# Patient Record
Sex: Female | Born: 1940 | Race: White | Hispanic: No | Smoking: Former smoker
Health system: Southern US, Community
[De-identification: ages and names within clinical notes are randomized; demographics above are authoritative.]

## PROBLEM LIST (undated history)

## (undated) DIAGNOSIS — J449 Chronic obstructive pulmonary disease, unspecified: Secondary | ICD-10-CM

## (undated) DIAGNOSIS — C679 Malignant neoplasm of bladder, unspecified: Secondary | ICD-10-CM

## (undated) DIAGNOSIS — Z9981 Dependence on supplemental oxygen: Secondary | ICD-10-CM

## (undated) DIAGNOSIS — Z972 Presence of dental prosthetic device (complete) (partial): Secondary | ICD-10-CM

## (undated) DIAGNOSIS — J189 Pneumonia, unspecified organism: Secondary | ICD-10-CM

## (undated) DIAGNOSIS — Z5189 Encounter for other specified aftercare: Secondary | ICD-10-CM

## (undated) DIAGNOSIS — Z87891 Personal history of nicotine dependence: Secondary | ICD-10-CM

## (undated) DIAGNOSIS — J9383 Other pneumothorax: Secondary | ICD-10-CM

## (undated) DIAGNOSIS — N289 Disorder of kidney and ureter, unspecified: Secondary | ICD-10-CM

## (undated) DIAGNOSIS — R569 Unspecified convulsions: Secondary | ICD-10-CM

## (undated) DIAGNOSIS — M1990 Unspecified osteoarthritis, unspecified site: Secondary | ICD-10-CM

## (undated) DIAGNOSIS — C801 Malignant (primary) neoplasm, unspecified: Secondary | ICD-10-CM

## (undated) DIAGNOSIS — Z87442 Personal history of urinary calculi: Secondary | ICD-10-CM

## (undated) DIAGNOSIS — F419 Anxiety disorder, unspecified: Secondary | ICD-10-CM

## (undated) DIAGNOSIS — D649 Anemia, unspecified: Secondary | ICD-10-CM

## (undated) DIAGNOSIS — R0600 Dyspnea, unspecified: Secondary | ICD-10-CM

## (undated) DIAGNOSIS — Z973 Presence of spectacles and contact lenses: Secondary | ICD-10-CM

## (undated) HISTORY — PX: DILATION AND CURETTAGE OF UTERUS: SHX78

## (undated) HISTORY — PX: ABDOMINAL HYSTERECTOMY: SHX81

## (undated) HISTORY — PX: REVISION UROSTOMY CUTANEOUS: SUR1282

---

## 2007-04-30 ENCOUNTER — Emergency Department (HOSPITAL_COMMUNITY): Admission: EM | Admit: 2007-04-30 | Discharge: 2007-04-30 | Payer: Self-pay | Admitting: Emergency Medicine

## 2007-05-07 ENCOUNTER — Ambulatory Visit (HOSPITAL_COMMUNITY): Admission: RE | Admit: 2007-05-07 | Discharge: 2007-05-07 | Payer: Self-pay | Admitting: Family Medicine

## 2007-09-30 HISTORY — PX: CYSTECTOMY W/ URETEROILEAL CONDUIT: SUR361

## 2007-12-06 ENCOUNTER — Emergency Department (HOSPITAL_COMMUNITY): Admission: EM | Admit: 2007-12-06 | Discharge: 2007-12-06 | Payer: Self-pay | Admitting: Emergency Medicine

## 2007-12-07 ENCOUNTER — Ambulatory Visit (HOSPITAL_COMMUNITY): Admission: RE | Admit: 2007-12-07 | Discharge: 2007-12-07 | Payer: Self-pay | Admitting: Emergency Medicine

## 2008-05-24 ENCOUNTER — Ambulatory Visit (HOSPITAL_COMMUNITY): Admission: RE | Admit: 2008-05-24 | Discharge: 2008-05-24 | Payer: Self-pay | Admitting: Urology

## 2008-05-30 ENCOUNTER — Inpatient Hospital Stay (HOSPITAL_COMMUNITY): Admission: RE | Admit: 2008-05-30 | Discharge: 2008-06-02 | Payer: Self-pay | Admitting: Urology

## 2008-05-30 ENCOUNTER — Encounter (INDEPENDENT_AMBULATORY_CARE_PROVIDER_SITE_OTHER): Payer: Self-pay | Admitting: Urology

## 2008-06-08 ENCOUNTER — Ambulatory Visit (HOSPITAL_COMMUNITY): Admission: RE | Admit: 2008-06-08 | Discharge: 2008-06-08 | Payer: Self-pay | Admitting: Urology

## 2008-09-26 ENCOUNTER — Ambulatory Visit (HOSPITAL_COMMUNITY): Admission: RE | Admit: 2008-09-26 | Discharge: 2008-09-26 | Payer: Self-pay | Admitting: Family Medicine

## 2010-10-20 ENCOUNTER — Encounter: Payer: Self-pay | Admitting: Family Medicine

## 2011-01-10 ENCOUNTER — Other Ambulatory Visit (HOSPITAL_COMMUNITY): Payer: Self-pay | Admitting: Family Medicine

## 2011-01-10 DIAGNOSIS — Z139 Encounter for screening, unspecified: Secondary | ICD-10-CM

## 2011-01-17 ENCOUNTER — Ambulatory Visit (HOSPITAL_COMMUNITY): Payer: Medicare Other

## 2011-02-11 ENCOUNTER — Emergency Department (HOSPITAL_COMMUNITY): Payer: Medicare Other

## 2011-02-11 ENCOUNTER — Emergency Department (HOSPITAL_COMMUNITY)
Admission: EM | Admit: 2011-02-11 | Discharge: 2011-02-11 | Disposition: A | Discharge status: Home / Self Care | Payer: Medicare Other | Attending: Emergency Medicine | Admitting: Emergency Medicine

## 2011-02-11 DIAGNOSIS — Z936 Other artificial openings of urinary tract status: Secondary | ICD-10-CM | POA: Insufficient documentation

## 2011-02-11 DIAGNOSIS — M129 Arthropathy, unspecified: Secondary | ICD-10-CM | POA: Insufficient documentation

## 2011-02-11 DIAGNOSIS — N390 Urinary tract infection, site not specified: Secondary | ICD-10-CM | POA: Insufficient documentation

## 2011-02-11 DIAGNOSIS — Z79899 Other long term (current) drug therapy: Secondary | ICD-10-CM | POA: Insufficient documentation

## 2011-02-11 DIAGNOSIS — Z85528 Personal history of other malignant neoplasm of kidney: Secondary | ICD-10-CM | POA: Insufficient documentation

## 2011-02-11 DIAGNOSIS — R109 Unspecified abdominal pain: Secondary | ICD-10-CM | POA: Insufficient documentation

## 2011-02-11 LAB — DIFFERENTIAL
Basophils Relative: 1 % (ref 0–1)
Eosinophils Absolute: 0.1 10*3/uL (ref 0.0–0.7)
Eosinophils Relative: 2 % (ref 0–5)
Monocytes Absolute: 0.3 10*3/uL (ref 0.1–1.0)
Monocytes Relative: 6 % (ref 3–12)
Neutrophils Relative %: 44 % (ref 43–77)

## 2011-02-11 LAB — URINALYSIS, ROUTINE W REFLEX MICROSCOPIC
Ketones, ur: NEGATIVE mg/dL
Protein, ur: NEGATIVE mg/dL
Urobilinogen, UA: 0.2 mg/dL (ref 0.0–1.0)

## 2011-02-11 LAB — BASIC METABOLIC PANEL
Calcium: 10.3 mg/dL (ref 8.4–10.5)
Creatinine, Ser: 0.74 mg/dL (ref 0.4–1.2)
GFR calc non Af Amer: >60 mL/min (ref >60)
Sodium: 144 mEq/L (ref 135–145)

## 2011-02-11 LAB — CBC
MCH: 32.4 pg (ref 26.0–34.0)
MCHC: 33.8 g/dL (ref 30.0–36.0)
MCV: 95.9 fL (ref 78.0–100.0)
Platelets: 221 10*3/uL (ref 150–400)
RDW: 13 % (ref 11.5–15.5)

## 2011-02-11 LAB — URINE MICROSCOPIC-ADD ON

## 2011-02-11 NOTE — Op Note (Signed)
NAME:  Amanda [name], Amanda [name]                ACCOUNT NO.:  [id number]   MEDICAL RECORD NO.:  [id number]          PATIENT TYPE:  INP   LOCATION:  A325                          FACILITY:  [facility]   PHYSICIAN:  Ky Barban, M.D.DATE OF BIRTH:  Dec 24, 1940   DATE OF PROCEDURE:  DATE OF DISCHARGE:                               OPERATIVE REPORT   PREOPERATIVE DIAGNOSIS:  Large bladder tumor.   POSTOPERATIVE DIAGNOSIS:  Large bladder tumor.   PROCEDURE:  Transurethral resection of bladder tumor.   ANESTHESIA:  Spinal.   PROCEDURE:  The patient was given spinal anesthesia and placed in  lithotomy position after usual prep and drape.  A #28 Iglesias  resectoscope was introduced into the bladder.  The tumor, which is  located behind the bladder neck on the right bladder wall and the right  ureteral orifice, is not seen.  The tumor appeared solid and large.  It  was resected completely.  There are other several superficial foci in  the bladder scattered in different areas.  They were simply resected and  the bases were cauterized.  The area of the large tumor, which was on  the right bladder wall, was completely resected.  The bleeders were  coagulated.  Chips were evacuated.  The rest of the bladder is  thoroughly inspected and looks clear, so the resectoscope was removed.  A bimanual pelvic exam is done.  I do not feel any solid area in the  bladder.  The bladder feels supple and soft.  Hopefully, I have removed  the tumor.  All the instruments are removed.  A #22 three-way Foley  catheter was left in for drainage.  It is clear.  The patient left the  operating room in satisfactory condition.      Ky Barban, M.D.  Electronically Signed     MIJ/MEDQ  D:  05/30/2008  T:  05/31/2008  Job:  [id number]

## 2011-02-11 NOTE — Consult Note (Signed)
NAME:  Amanda [name], Amanda [name]                ACCOUNT NO.:  [id number]   MEDICAL RECORD NO.:  [id number]           PATIENT TYPE:  AMB   LOCATION:  DAY                           FACILITY:  [facility]   PHYSICIAN:  Ky Barban, M.D.DATE OF BIRTH:  05/28/1941   DATE OF CONSULTATION:  DATE OF DISCHARGE:                                 CONSULTATION   CHIEF COMPLAINT:  Bladder cancer.   HISTORY OF PRESENT ILLNESS:  A 70 year old female seen by me on August  5, complaining of pain in her left lower quadrant, no other significant  complaint.  A CT scan shows __________stone in the distal  __________causing outlet obstruction.  She was not having any urinary  complaints, but there was a lot of blood in the urine.  On cystoscope,  she was found to have a solid tumor in the bladder.  __________right  adnexal cystic mass, but the ultrasound showed there was a __________,  but now I think it is the tumor in the bladder that may be giving the  appearance.  The patient is being brought to the outpatient to undergo  TUR bladder tumor.  I had long discussion with the patient and her  family __________ procedure, its complication especially, bladder  perforation, injury to adjacent organs requiring additional surgery, and  they understood and wanted to proceed.   PAST MEDICAL HISTORY:  Otherwise unremarkable except for a history of  __________ arthritis.  She had a __________   SOCIAL HISTORY:  Does not smoke or drink.  __________ unremarkable.   ALLERGIES:  PENICILLIN, __________, ASPIRIN.   PHYSICAL EXAMINATION:  VITAL SIGNS:  Temperature 110/62, temperature  98.4, __________  HEAD/NECK, EYE, ENT:  Negative.  CHEST:  Symmetrical.  HEART:  Regular sinus rhythm.  No murmur.  ABDOMEN:  Soft, flat.  Liver, spleen, kidneys are not palpable.  No CV  tenderness.  PELVIC:  No adnexal mass or tenderness.   IMPRESSION:  Bladder cancer.  Plan TUR bladder tumor under anesthesia  then admit her into the  hospital.      Ky Barban, M.D.  Electronically Signed     MIJ/MEDQ  D:  05/29/2008  T:  05/29/2008  Job:  [id number]

## 2011-02-11 NOTE — Consult Note (Signed)
NAME:  Amanda [name], Amanda [name]                ACCOUNT NO.:  [id number]   MEDICAL RECORD NO.:  [id number]          PATIENT TYPE:  INP   LOCATION:  A325                          FACILITY:  [facility]   PHYSICIAN:  Dorris Singh, DO    DATE OF BIRTH:  [birth year]   DATE OF CONSULTATION:  DATE OF DISCHARGE:                                 CONSULTATION   REASON FOR CONSULTATION:  We were consulted to see the patient.  There  is no reason to note it.   HISTORY OF PRESENT ILLNESS:  The patient is a 70 year old female who  presented on September 1 to the emergency room with a chief complaint of  abdominal pain.  The pain was persistent.  It was located left  epigastric region and left lingual region.  While she was here, she was  diagnosed with a left kidney stone and an adnexal mass on the right and  the left kidney stone and right adnexal mass.  Later she was found to  have a bladder tumor in which she had removed by Dr. Jerre Simon, actually  had a biopsy done as well with the cystoscope.   PAST MEDICAL HISTORY:  1. Arthritis.  2. History of kidney stones.   PAST SURGICAL HISTORY:  1. Hysterectomy and appendectomy in 1976.  2. Lymph node removed from the left adnexa at a doctor's office.  3. Kidney biopsy 1970 in [place].   ALLERGIES:  ASPIRIN, PENICILLIN, SULFA AND KEFLEX.   SOCIAL HISTORY:  She is married, has three children, has not traveled  anywhere recently.  Her birth place is in [place], [place], but has  lived in the area since she was married.  Her current list of  medications include Percocet 7.5/325.   REVIEW OF SYSTEMS:  CONSTITUTIONAL:  Negative for weight loss, weakness  or appetite changes.  EYES:  Negative for eye pain or diplopia.  EARS,  NOSE, MOUTH, THROAT:  Negative for ear pain, hearing loss, hoarseness,  otorrhea, rhinorrhea, sinus pressure or sore throat.  CARDIOVASCULAR:  Negative for chest pain or palpitations.  RESPIRATORY:  Negative for  cough, dyspnea,   GASTROINTESTINAL:  Positive for abdominal pain,  negative for nausea and vomiting.  GU:  Positive for flank pain.  Negative for dysuria, urgency.  MUSCULOSKELETAL:  Positive for  arthritis, negative for arthralgias and myalgias.  SKIN:  Negative for  pruritus or rash.  NEUROLOGICAL:  Negative for forehead confusion or  mental status.  METABOLIC:  Negative for excessive thirst and cold  intolerance.  HEMATOLOGIC:  Negative for anemia, bleeding or tendency.   PHYSICAL EXAMINATION:  VITAL SIGNS:  Current vitals are as follows:  Temperature 98.4, pulse 61, respirations 20, blood pressure 98/52.  GENERAL:  The patient is a 70 year old Caucasian female who is well-  developed, well-nourished in no acute distress.  HEENT:  Head is normocephalic, atraumatic.  Eyes are PERRL.  EOMI.  Nose:  Turbinates are moist.  EARS:  TMs visualized bilaterally.  Throat  clear.  NECK:  Supple.  No lymphadenopathy.  HEART:  Regular rate and rhythm.  No murmurs noted.  S1-S2  present.  LUNGS:  Clear to auscultation bilaterally.  No wheezes, rales or  rhonchi.  ABDOMEN:  Soft, nontender, nondistended.  No organomegaly noted.  EXTREMITIES:  Good strength in all four extremities.  Good pulses, no  edema, ecchymosis or cyanosis noted.  The patient does not have any  current labs that were done for today or yesterday.  Her most recent  labs that were obtained include a B-met on August 31.  She has not  anything recent.  Even with the August 31 labs, the only medical  abnormality is minimally low potassium as well as for a CBC, her  hemoglobin is 13.1, hematocrit of 39.3.  The rest of it is within normal  limits.  Platelet count of 321.  Sodium is 139, glucose 101, BUN is 12  and her creatinine is 0.74.   ASSESSMENT/PLAN:  1. Low blood pressure.  The patient states that her blood pressure      runs normally this low.  I think this is her normal level.  I just      recommend continue to monitor her.  She is  asymptomatic.  There is      no need for any treatment at this point in time.  2. History of hypokalemia.  There is no order, so apparently this was      not corrected on August 31.  There is a call in to Dr. Jerre Simon who      referred to Dr. Jayme Cloud, but I do not see any orders here from      him,  3. Bladder tumor.  Status post cystoscopy with biopsy.   PLAN:  Will go ahead and replace the patient's potassium with a standing  order, really at this point in time, the patient seems very stable.  There is really not much to medically manage for her, but we will go  ahead and replace her potassium if needed with a standing order, and at  this point in time, we will sign off the case.  Please reconsult [place] if  you need to.  Thank you for this opportunity.      Dorris Singh, DO  Electronically Signed     CB/MEDQ  D:  06/01/2008  T:  06/01/2008  Job:  [id number]

## 2011-02-14 ENCOUNTER — Other Ambulatory Visit (HOSPITAL_COMMUNITY): Payer: Self-pay | Admitting: Urology

## 2011-02-14 DIAGNOSIS — M549 Dorsalgia, unspecified: Secondary | ICD-10-CM

## 2011-02-14 LAB — URINE CULTURE
Colony Count: >=100000
Culture  Setup Time: 201205162107

## 2011-02-14 NOTE — Discharge Summary (Signed)
NAME:  Amanda [name], Amanda [name]                ACCOUNT NO.:  [id number]   MEDICAL RECORD NO.:  [id number]          PATIENT TYPE:  INP   LOCATION:  A325                          FACILITY:  [facility]   PHYSICIAN:  Ky Barban, M.D.DATE OF BIRTH:  04-20-1941   DATE OF ADMISSION:  05/30/2008  DATE OF DISCHARGE:  09/04/2009LH                               DISCHARGE SUMMARY   A 70 year old female was having pain in her left lower quadrant and CT  scan showed there is a small stone 2-mm in the distal left ureter and  also there is a large solid tumor in the bladder as it was confirmed by  doing cystoscopy in the office.  So I have bringing her in the hospital  to do TUR bladder tumor.   PAST MEDICAL HISTORY:  Otherwise unremarkable except arthritis.   SOCIAL HISTORY:  She does not smoke or drink.   REVIEW OF SYSTEMS:  Remarkable.   ALLERGIES:  PENICILLIN and ASPIRIN.   Physical exam was negative.  She had a routine preadmission workup done  and was taken to the operating room on May 30, 2008 and underwent  TUR bladder tumor, which was large.  The tumor was completely resected  down to the smooth muscles.  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  The urine is clear.  She is  voiding clear urine without any difficulty.  I am going to discharge her  home.  We will see her in the office in 1 week.      Ky Barban, M.D.  Electronically Signed     MIJ/MEDQ  D:  07/26/2008  T:  07/27/2008  Job:  [id number]

## 2011-02-19 ENCOUNTER — Encounter (HOSPITAL_COMMUNITY): Payer: Medicare Other

## 2011-06-23 LAB — BASIC METABOLIC PANEL
BUN: 12
Calcium: 9.5
Chloride: 104
Creatinine, Ser: 0.68
GFR calc Af Amer: >60

## 2011-06-23 LAB — CBC
MCV: 93.6
Platelets: 240
RBC: 4.15
WBC: 6.3

## 2011-06-23 LAB — URINALYSIS, ROUTINE W REFLEX MICROSCOPIC
Glucose, UA: NEGATIVE
Glucose, UA: NEGATIVE
Ketones, ur: NEGATIVE
Leukocytes, UA: NEGATIVE
Protein, ur: 100 — AB
Specific Gravity, Urine: <1.005 — ABNORMAL LOW
Urobilinogen, UA: 0.2
pH: 5

## 2011-06-23 LAB — URINE MICROSCOPIC-ADD ON

## 2011-06-23 LAB — DIFFERENTIAL
Basophils Relative: 1
Eosinophils Absolute: 0.1
Lymphs Abs: 2.1
Neutro Abs: 3.6
Neutrophils Relative %: 58

## 2011-07-02 LAB — BASIC METABOLIC PANEL
BUN: 7
BUN: 7
BUN: 8
CO2: 30
CO2: 32
CO2: 33 — ABNORMAL HIGH
Chloride: 105
Chloride: 107
Chloride: 109
Creatinine, Ser: 0.68
GFR calc Af Amer: >60
Glucose, Bld: 102 — ABNORMAL HIGH
Glucose, Bld: 102 — ABNORMAL HIGH
Glucose, Bld: 88
Potassium: 2.9 — ABNORMAL LOW
Potassium: 3.4 — ABNORMAL LOW
Sodium: 141

## 2011-07-14 LAB — URINE MICROSCOPIC-ADD ON

## 2011-07-14 LAB — URINALYSIS, ROUTINE W REFLEX MICROSCOPIC
Bilirubin Urine: NEGATIVE
Glucose, UA: NEGATIVE

## 2013-10-14 ENCOUNTER — Other Ambulatory Visit (HOSPITAL_COMMUNITY): Payer: Self-pay | Admitting: Family Medicine

## 2013-10-14 ENCOUNTER — Ambulatory Visit (HOSPITAL_COMMUNITY)
Admission: RE | Admit: 2013-10-14 | Discharge: 2013-10-14 | Disposition: A | Discharge status: Home / Self Care | Payer: Medicare Other | Source: Ambulatory Visit | Attending: Family Medicine | Admitting: Family Medicine

## 2013-10-14 DIAGNOSIS — M545 Low back pain, unspecified: Secondary | ICD-10-CM | POA: Insufficient documentation

## 2013-10-14 DIAGNOSIS — Z8551 Personal history of malignant neoplasm of bladder: Secondary | ICD-10-CM | POA: Insufficient documentation

## 2013-10-14 DIAGNOSIS — M549 Dorsalgia, unspecified: Secondary | ICD-10-CM

## 2013-10-14 DIAGNOSIS — I700 Atherosclerosis of aorta: Secondary | ICD-10-CM | POA: Insufficient documentation

## 2013-10-14 DIAGNOSIS — J4489 Other specified chronic obstructive pulmonary disease: Secondary | ICD-10-CM | POA: Insufficient documentation

## 2013-10-14 DIAGNOSIS — J449 Chronic obstructive pulmonary disease, unspecified: Secondary | ICD-10-CM | POA: Insufficient documentation

## 2013-10-14 DIAGNOSIS — M47817 Spondylosis without myelopathy or radiculopathy, lumbosacral region: Secondary | ICD-10-CM | POA: Insufficient documentation

## 2017-09-15 ENCOUNTER — Other Ambulatory Visit (HOSPITAL_COMMUNITY): Payer: Self-pay | Admitting: Family Medicine

## 2017-09-15 ENCOUNTER — Ambulatory Visit (HOSPITAL_COMMUNITY)
Admission: RE | Admit: 2017-09-15 | Discharge: 2017-09-15 | Disposition: A | Discharge status: Home / Self Care | Payer: Medicare Other | Source: Ambulatory Visit | Attending: Family Medicine | Admitting: Family Medicine

## 2017-09-15 DIAGNOSIS — M25519 Pain in unspecified shoulder: Secondary | ICD-10-CM

## 2017-09-15 DIAGNOSIS — M25511 Pain in right shoulder: Secondary | ICD-10-CM | POA: Insufficient documentation

## 2017-09-30 ENCOUNTER — Other Ambulatory Visit (HOSPITAL_COMMUNITY): Payer: Self-pay | Admitting: Family Medicine

## 2017-09-30 DIAGNOSIS — G8929 Other chronic pain: Secondary | ICD-10-CM

## 2017-09-30 DIAGNOSIS — M25511 Pain in right shoulder: Principal | ICD-10-CM

## 2017-10-06 ENCOUNTER — Ambulatory Visit (HOSPITAL_COMMUNITY)
Admission: RE | Admit: 2017-10-06 | Discharge: 2017-10-06 | Disposition: A | Discharge status: Home / Self Care | Payer: Medicare Other | Source: Ambulatory Visit | Attending: Family Medicine | Admitting: Family Medicine

## 2017-10-06 DIAGNOSIS — M75101 Unspecified rotator cuff tear or rupture of right shoulder, not specified as traumatic: Secondary | ICD-10-CM | POA: Diagnosis not present

## 2017-10-06 DIAGNOSIS — M25511 Pain in right shoulder: Secondary | ICD-10-CM

## 2017-10-06 DIAGNOSIS — G8929 Other chronic pain: Secondary | ICD-10-CM | POA: Diagnosis not present

## 2018-05-30 DIAGNOSIS — J9383 Other pneumothorax: Secondary | ICD-10-CM

## 2018-05-30 HISTORY — DX: Other pneumothorax: J93.83

## 2018-06-21 ENCOUNTER — Inpatient Hospital Stay (HOSPITAL_COMMUNITY): Payer: Medicare Other

## 2018-06-21 ENCOUNTER — Inpatient Hospital Stay (HOSPITAL_COMMUNITY)
Admission: EM | Admit: 2018-06-21 | Discharge: 2018-06-25 | DRG: 201 | Disposition: A | Discharge status: Home / Self Care | Payer: Medicare Other | Attending: Thoracic Surgery (Cardiothoracic Vascular Surgery) | Admitting: Thoracic Surgery (Cardiothoracic Vascular Surgery)

## 2018-06-21 ENCOUNTER — Emergency Department (HOSPITAL_COMMUNITY): Payer: Medicare Other

## 2018-06-21 ENCOUNTER — Encounter: Payer: Self-pay | Admitting: Emergency Medicine

## 2018-06-21 DIAGNOSIS — Z87891 Personal history of nicotine dependence: Secondary | ICD-10-CM | POA: Diagnosis not present

## 2018-06-21 DIAGNOSIS — J939 Pneumothorax, unspecified: Secondary | ICD-10-CM

## 2018-06-21 DIAGNOSIS — Z938 Other artificial opening status: Secondary | ICD-10-CM

## 2018-06-21 DIAGNOSIS — Z8551 Personal history of malignant neoplasm of bladder: Secondary | ICD-10-CM | POA: Diagnosis not present

## 2018-06-21 DIAGNOSIS — E43 Unspecified severe protein-calorie malnutrition: Secondary | ICD-10-CM

## 2018-06-21 DIAGNOSIS — Z87442 Personal history of urinary calculi: Secondary | ICD-10-CM | POA: Diagnosis not present

## 2018-06-21 DIAGNOSIS — J9383 Other pneumothorax: Principal | ICD-10-CM | POA: Diagnosis present

## 2018-06-21 DIAGNOSIS — R0602 Shortness of breath: Secondary | ICD-10-CM | POA: Diagnosis present

## 2018-06-21 DIAGNOSIS — J432 Centrilobular emphysema: Secondary | ICD-10-CM | POA: Diagnosis present

## 2018-06-21 DIAGNOSIS — F0390 Unspecified dementia without behavioral disturbance: Secondary | ICD-10-CM | POA: Diagnosis present

## 2018-06-21 HISTORY — DX: Unspecified convulsions: R56.9

## 2018-06-21 HISTORY — DX: Disorder of kidney and ureter, unspecified: N28.9

## 2018-06-21 HISTORY — DX: Malignant (primary) neoplasm, unspecified: C80.1

## 2018-06-21 HISTORY — DX: Chronic obstructive pulmonary disease, unspecified: J44.9

## 2018-06-21 HISTORY — DX: Encounter for other specified aftercare: Z51.89

## 2018-06-21 HISTORY — DX: Other pneumothorax: J93.83

## 2018-06-21 HISTORY — DX: Unspecified osteoarthritis, unspecified site: M19.90

## 2018-06-21 LAB — CBC WITH DIFFERENTIAL/PLATELET
Basophils Absolute: 0.1 10*3/uL (ref 0.0–0.1)
Basophils Relative: 1 %
EOS ABS: 0.1 10*3/uL (ref 0.0–0.7)
Eosinophils Relative: 2 %
HCT: 38.3 % (ref 36.0–46.0)
Hemoglobin: 12.4 g/dL (ref 12.0–15.0)
LYMPHS ABS: 2 10*3/uL (ref 0.7–4.0)
LYMPHS PCT: 35 %
MCH: 31.1 pg (ref 26.0–34.0)
MCHC: 32.4 g/dL (ref 30.0–36.0)
MCV: 96 fL (ref 78.0–100.0)
Monocytes Absolute: 0.5 10*3/uL (ref 0.1–1.0)
Monocytes Relative: 8 %
Neutro Abs: 3.1 10*3/uL (ref 1.7–7.7)
Neutrophils Relative %: 54 %
Platelets: 251 10*3/uL (ref 150–400)
RBC: 3.99 MIL/uL (ref 3.87–5.11)
RDW: 13.3 % (ref 11.5–15.5)
WBC: 5.7 10*3/uL (ref 4.0–10.5)

## 2018-06-21 LAB — HEPATIC FUNCTION PANEL
ALK PHOS: 61 U/L (ref 38–126)
ALT: 12 U/L (ref 0–44)
AST: 21 U/L (ref 15–41)
Albumin: 3.8 g/dL (ref 3.5–5.0)
BILIRUBIN TOTAL: 0.6 mg/dL (ref 0.3–1.2)
Total Protein: 6.9 g/dL (ref 6.5–8.1)

## 2018-06-21 LAB — BASIC METABOLIC PANEL
Anion gap: 10 (ref 5–15)
BUN: 12 mg/dL (ref 8–23)
CHLORIDE: 108 mmol/L (ref 98–111)
CO2: 24 mmol/L (ref 22–32)
Calcium: 9.4 mg/dL (ref 8.9–10.3)
Creatinine, Ser: 0.95 mg/dL (ref 0.44–1.00)
GFR calc Af Amer: >60 mL/min (ref >60)
GFR calc non Af Amer: 56 mL/min — ABNORMAL LOW (ref >60)
Glucose, Bld: 101 mg/dL — ABNORMAL HIGH (ref 70–99)
POTASSIUM: 4 mmol/L (ref 3.5–5.1)
SODIUM: 142 mmol/L (ref 135–145)

## 2018-06-21 LAB — TROPONIN I

## 2018-06-21 MED ORDER — ENOXAPARIN SODIUM 40 MG/0.4ML ~~LOC~~ SOLN
40.0000 mg | SUBCUTANEOUS | Status: DC
  Administered 2018-06-22 – 2018-06-25 (×4): 40 mg via SUBCUTANEOUS
  Filled 2018-06-21 (×4): qty 0.4

## 2018-06-21 MED ORDER — HYDROCODONE-ACETAMINOPHEN 5-325 MG PO TABS
1.0000 | ORAL_TABLET | ORAL | Status: DC | PRN
  Administered 2018-06-22: 2 via ORAL
  Administered 2018-06-22 (×2): 1 via ORAL
  Administered 2018-06-23: 2 via ORAL
  Filled 2018-06-21 (×2): qty 2
  Filled 2018-06-21 (×2): qty 1

## 2018-06-21 MED ORDER — METHYLPREDNISOLONE SODIUM SUCC 125 MG IJ SOLR
125.0000 mg | Freq: Once | INTRAMUSCULAR | Status: AC
  Administered 2018-06-21: 125 mg via INTRAVENOUS
  Filled 2018-06-21: qty 2

## 2018-06-21 MED ORDER — LIDOCAINE HCL (CARDIAC) PF 100 MG/5ML IV SOSY
PREFILLED_SYRINGE | INTRAVENOUS | Status: AC
  Filled 2018-06-21: qty 10

## 2018-06-21 MED ORDER — ALBUTEROL SULFATE (2.5 MG/3ML) 0.083% IN NEBU
2.5000 mg | INHALATION_SOLUTION | Freq: Once | RESPIRATORY_TRACT | Status: AC
  Administered 2018-06-21: 2.5 mg via RESPIRATORY_TRACT
  Filled 2018-06-21: qty 3

## 2018-06-21 MED ORDER — IPRATROPIUM-ALBUTEROL 0.5-2.5 (3) MG/3ML IN SOLN
3.0000 mL | Freq: Once | RESPIRATORY_TRACT | Status: AC
  Administered 2018-06-21: 3 mL via RESPIRATORY_TRACT
  Filled 2018-06-21: qty 3

## 2018-06-21 MED ORDER — GI COCKTAIL ~~LOC~~: 30.0000 mL | Freq: Three times a day (TID) | ORAL | Status: DC | PRN

## 2018-06-21 MED ORDER — LORAZEPAM 2 MG/ML IJ SOLN
0.5000 mg | Freq: Once | INTRAMUSCULAR | Status: AC
  Administered 2018-06-21: 0.5 mg via INTRAVENOUS
  Filled 2018-06-21: qty 1

## 2018-06-21 MED ORDER — MAGNESIUM SULFATE 2 GM/50ML IV SOLN
2.0000 g | Freq: Once | INTRAVENOUS | Status: AC
  Administered 2018-06-21: 2 g via INTRAVENOUS
  Filled 2018-06-21: qty 50

## 2018-06-21 MED ORDER — ALPRAZOLAM 0.5 MG PO TABS: 0.5000 mg | ORAL_TABLET | Freq: Three times a day (TID) | ORAL | Status: DC | PRN

## 2018-06-21 MED ORDER — MORPHINE SULFATE (PF) 2 MG/ML IV SOLN: 2.0000 mg | INTRAVENOUS | Status: DC | PRN

## 2018-06-21 NOTE — ED Notes (Signed)
Report given to the [facility] ED Charge Nurse name [place].

## 2018-06-21 NOTE — ED Provider Notes (Signed)
[facility] EMERGENCY DEPARTMENT Provider Note   CSN: [id number] Arrival date & time: 06/21/18  1605     History   Chief Complaint Chief Complaint  Patient presents with  . Shortness of Breath    HPI Amanda [name] is a 77 y.o. female.  Patient complains of shortness of breath.  She has a history of COPD.  She became short of breath last night.  No chest pain  The history is provided by the patient. No language interpreter was used.  Shortness of Breath  This is a new problem. The problem occurs continuously.The current episode started 6 to 12 hours ago. The problem has not changed since onset.Pertinent negatives include no fever, no headaches, no cough, no chest pain, no abdominal pain and no rash. It is unknown what precipitated the problem. Risk factors: COPD. She has tried nothing for the symptoms. She has had prior hospitalizations.    Past Medical History:  Diagnosis Date  . Arthritis   . Blood transfusion without reported diagnosis   . Cancer ([place])    bladder  . COPD (chronic obstructive pulmonary disease) ([place])   . Renal disorder    kidney stones  . Seizures [facility])     Patient Active Problem List   Diagnosis Date Noted  . SOB (shortness of breath) 06/21/2018    Past Surgical History:  Procedure Laterality Date  . ABDOMINAL HYSTERECTOMY    . REVISION UROSTOMY CUTANEOUS       OB History   None      Home Medications    Prior to Admission medications   Medication Sig Start Date End Date Taking? Authorizing Provider  acetaminophen (TYLENOL) 500 MG tablet Take 500-1,000 mg by mouth daily as needed for mild pain or moderate pain.    Yes [provider]  ALPRAZolam Duanne Moron) 0.5 MG tablet Take 0.5 mg by mouth 3 (three) times daily.  06/01/18  Yes [provider]  Cholecalciferol (VITAMIN D-1000 MAX ST) 1000 units tablet Take 1,000 Units by mouth daily.    Yes [provider]  Cyanocobalamin (B-12-SL) 1000 MCG SUBL Place 1 tablet  under the tongue daily.   Yes [provider]  HYDROcodone-acetaminophen (NORCO/VICODIN) 5-325 MG tablet Take 1 tablet by mouth 3 (three) times daily as needed for moderate pain.  05/24/18  Yes [provider]  Multiple Vitamin (MULTIVITAMIN) capsule Take 1 capsule by mouth daily.    Yes [provider]  vitamin C (ASCORBIC ACID) 250 MG tablet Take 500 mg by mouth daily.    Yes [provider]    Family History History reviewed. No pertinent family history.  Social History Social History   Tobacco Use  . Smoking status: Former Smoker    Types: Cigarettes  Substance Use Topics  . Alcohol use: Never    Frequency: Never  . Drug use: Never     Allergies   Ciprofloxacin; Lactose; Levofloxacin; Sulfa antibiotics; Sulfamethoxazole; Aspirin; Cephalexin; Clindamycin; Erythromycin base; Hydromorphone; and Penicillins   Review of Systems Review of Systems  Constitutional: Negative for appetite change, fatigue and fever.  HENT: Negative for congestion, ear discharge and sinus pressure.   Eyes: Negative for discharge.  Respiratory: Positive for shortness of breath. Negative for cough.   Cardiovascular: Negative for chest pain.  Gastrointestinal: Negative for abdominal pain and diarrhea.  Genitourinary: Negative for frequency and hematuria.  Musculoskeletal: Negative for back pain.  Skin: Negative for rash.  Neurological: Negative for seizures and headaches.  Psychiatric/Behavioral: Negative for hallucinations.  Physical Exam Updated Vital Signs BP 100/62   Pulse (!) 104   Temp (!) 97.4 F (36.3 C) (Oral)   Resp 19   Ht 5\' 5"  (1.651 m)   Wt 47.6 kg   SpO2 94%   BMI 17.47 kg/m   Physical Exam  Constitutional: She is oriented to person, place, and time. She appears well-developed.  HENT:  Head: Normocephalic.  Eyes: Conjunctivae and EOM are normal. No scleral icterus.  Neck: Neck supple. No thyromegaly present.  Cardiovascular:  Regular rhythm. Exam reveals no gallop and no friction rub.  No murmur heard. Minor tachycardia  Pulmonary/Chest: No stridor. She has no wheezes. She has no rales. She exhibits no tenderness.  Tachypnea  Abdominal: She exhibits no distension. There is no tenderness. There is no rebound.  Musculoskeletal: Normal range of motion. She exhibits no edema.  Lymphadenopathy:    She has no cervical adenopathy.  Neurological: She is oriented to person, place, and time. She exhibits normal muscle tone. Coordination normal.  Skin: No rash noted. No erythema.  Psychiatric: She has a normal mood and affect. Her behavior is normal.     ED Treatments / Results  Labs (all labs ordered are listed, but only abnormal results are displayed) Labs Reviewed  BASIC METABOLIC PANEL - Abnormal; Notable for the following components:      Result Value   Glucose, Bld 101 (*)    GFR calc non Af Amer 56 (*)    All other components within normal limits  CBC WITH DIFFERENTIAL/PLATELET  TROPONIN I  HEPATIC FUNCTION PANEL    EKG None  Radiology Dg Chest 2 View  Result Date: 06/21/2018 CLINICAL DATA:  Shortness of breath since yesterday, cough. History of COPD, bladder cancer. EXAM: CHEST - 2 VIEW COMPARISON:  Chest radiograph July 28, 2017 FINDINGS: RIGHT pneumothorax measuring at least 2.5 cm from chest wall. No mediastinal shift. Cardiomediastinal silhouette is normal. Calcified aortic knob. Increased lung volumes with chronic interstitial changes and biapical pleuroparenchymal scarring. No pleural effusion or focal consolidation. Soft tissue planes and included osseous structures are nonacute. IMPRESSION: 1. Moderate RIGHT pneumothorax.  No mediastinal shift. 2. 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 3.  Aortic Atherosclerosis (ICD10-I70.0). Acute findings discussed with and reconfirmed by Dr.Jonn Chaikin on 06/21/2018 at 5:55 pm. Electronically Signed   By: Elon Alas M.D.   On: 06/21/2018 17:56     Procedures Procedures (including critical care time)  Medications Ordered in ED Medications  ipratropium-albuterol (DUONEB) 0.5-2.5 (3) MG/3ML nebulizer solution 3 mL (3 mLs Nebulization Given 06/21/18 1706)  albuterol (PROVENTIL) (2.5 MG/3ML) 0.083% nebulizer solution 2.5 mg (2.5 mg Nebulization Given 06/21/18 1706)  methylPREDNISolone sodium succinate (SOLU-MEDROL) 125 mg/2 mL injection 125 mg (125 mg Intravenous Given 06/21/18 1717)  magnesium sulfate IVPB 2 g 50 mL (0 g Intravenous Stopped 06/21/18 1823)  LORazepam (ATIVAN) injection 0.5 mg (0.5 mg Intravenous Given 06/21/18 1718)     Initial Impression / Assessment and Plan / ED Course  I have reviewed the triage vital signs and the nursing notes.  Pertinent labs & imaging results that were available during my care of the patient were reviewed by me and considered in my medical decision making (see chart for details).     Chest x-ray shows mild to moderate pneumothorax with no shift.  I spoke with Dr.Hendrickson at [facility] and he stated to send the patient down to [facility] to a telemetry bed.  I spoke with Dr. Marye Round at [facility] emergency department and the patient  will be transferred down to the emergency department at [facility] until his treatment is available and Dr. Roxan Hockey will evaluate the patient  Final Clinical Impressions(s) / ED Diagnoses   Final diagnoses:  None    ED Discharge Orders    None       Milton Ferguson, MD 06/21/18 1934

## 2018-06-21 NOTE — Procedures (Signed)
Right pneumothorax Informed consent obtained Time out to confirm patient, site, procedure 5 ml of 1% lidocaine 14 F pigtail catheter placed right pleural space + air leak CT to suction  Remo Lipps C. Roxan Hockey, MD [facility] [phone]

## 2018-06-21 NOTE — ED Notes (Signed)
Respiratory therapist paged to provide nebulizer.

## 2018-06-21 NOTE — ED Triage Notes (Signed)
Pt states she has been having shortness of breath since last night, worsening this morning.  States her sob woke her up around 2am.

## 2018-06-22 ENCOUNTER — Inpatient Hospital Stay (HOSPITAL_COMMUNITY): Payer: Medicare Other

## 2018-06-22 ENCOUNTER — Encounter (HOSPITAL_COMMUNITY): Payer: Self-pay

## 2018-06-22 DIAGNOSIS — F0390 Unspecified dementia without behavioral disturbance: Secondary | ICD-10-CM

## 2018-06-22 LAB — CBC
HCT: 36.9 % (ref 36.0–46.0)
Hemoglobin: 11.6 g/dL — ABNORMAL LOW (ref 12.0–15.0)
MCH: 30.9 pg (ref 26.0–34.0)
MCHC: 31.4 g/dL (ref 30.0–36.0)
MCV: 98.4 fL (ref 78.0–100.0)
Platelets: 217 10*3/uL (ref 150–400)
RBC: 3.75 MIL/uL — ABNORMAL LOW (ref 3.87–5.11)
RDW: 13.1 % (ref 11.5–15.5)
WBC: 6.8 10*3/uL (ref 4.0–10.5)

## 2018-06-22 LAB — PROTIME-INR
INR: 1.07
Prothrombin Time: 13.8 seconds (ref 11.4–15.2)

## 2018-06-22 MED ORDER — TRAMADOL HCL 50 MG PO TABS
50.0000 mg | ORAL_TABLET | Freq: Four times a day (QID) | ORAL | Status: DC | PRN
  Administered 2018-06-22 – 2018-06-24 (×3): 50 mg via ORAL
  Filled 2018-06-22 (×3): qty 1

## 2018-06-22 MED ORDER — ONDANSETRON HCL 4 MG/2ML IJ SOLN: 4.0000 mg | Freq: Four times a day (QID) | INTRAMUSCULAR | Status: DC | PRN

## 2018-06-22 NOTE — Progress Notes (Addendum)
      [address]       [place] [address]             [phone]           Subjective: Patient with pain at chest tube site on the right.  Objective: Vital signs in last 24 hours: Temp:  [97.4 F (36.3 C)-98.8 F (37.1 C)] 97.9 F (36.6 C) (09/24 0453) Pulse Rate:  [95-116] 95 (09/24 0453) Cardiac Rhythm: Normal sinus rhythm (09/24 0114) Resp:  [16-34] 18 (09/24 0453) BP: (100-134)/(51-65) 121/65 (09/24 0453) SpO2:  [90 %-100 %] 97 % (09/24 0453) Weight:  [45.5 kg-47.6 kg] 45.5 kg (09/24 0453)      Intake/Output from previous day: 09/23 0701 - 09/24 0700 In: 530 [P.O.:480; IV Piggyback:50] Out: 250 [Urine:250]   Physical Exam:  Cardiovascular: RRR Pulmonary: Clear to auscultation bilaterally Extremities: No lower extremity edema. Wounds: Dressing is mostly clean and dry.   Chest Tube: to suction, no air leak  Lab Results: CBC: Recent Labs    06/21/18 1718 06/21/18 2343  WBC 5.7 6.8  HGB 12.4 11.6*  HCT 38.3 36.9  PLT 251 217   BMET:  Recent Labs    06/21/18 1718  NA 142  K 4.0  CL 108  CO2 24  GLUCOSE 101*  BUN 12  CREATININE 0.95  CALCIUM 9.4    PT/INR:  Recent Labs    06/21/18 2343  LABPROT 13.8  INR 1.07   ABG:  INR: Will add last result for INR, ABG once components are confirmed Will add last 4 CBG results once components are confirmed  Assessment/Plan:  1. CV - SR, ST at times. 2.  Pulmonary - History of COPD. On 2 liters of oxygen via [place]. Chest tube is to suction and there is no air leak. CXR this am appears to show a small right apical pneumothorax. Chest tube put in just before midnight so will leave to suction for now. As discussed with Dr. Roxan Hockey, will order CT chest without contrast.  Sharalyn Ink [facility] 06/22/2018,7:26 AM [phone]  Patient seen and examined, agree with above She did not recall who I was,  Has demonstrated repeated short term memory deficits c/w dementia Per family  she had a right pneumothorax about a year ago and she has a scar on her lateral chest c/w a CT site. High likelihood of recurrence, will check a CT chest. Addendum- confirmed prior R pneumo in care everywhere- was treated in [place]. Roxan Hockey, MD [facility] [phone]

## 2018-06-22 NOTE — H&P (Addendum)
[address]            [place] [address]          [phone]     Amanda [name] is an 77 y.o. female. 10-02-1940 UEA:[id number]   Chief Complaint: Shortness of breath  History of Presenting Illness:  This is a 77 year old Caucasian female with a past medical history of remote tobacco abuse, COPD, bladder cancer who presented to [facility] with complaints of worsening shortness of breath yesterday evening. Chest x ray done yesterday showed a moderate right pneumothorax. She was transferred to [facility] and Dr. Roxan Hockey placed a 16 French right chest tube. Follow up chest x ray showed a significant decrease in the right pneumothorax. Of note, patient has had a right spontaneous pneumothorax at the end of September and was taken care of at [facility] in [place].  Currently, patient is no acute distress, chest tube is to suction and there is no air leak, and oxygenation is 99% on 2 liters of oxygen via [place].  Past Medical History: Past Medical History:  Diagnosis Date  . Arthritis   . Blood transfusion without reported diagnosis   . Cancer ([place])    bladder  . COPD (chronic obstructive pulmonary disease) ([place])   . Renal disorder    kidney stones  . Seizures ([place])     Past Surgical History: Past Surgical History:  Procedure Laterality Date  . ABDOMINAL HYSTERECTOMY    . REVISION UROSTOMY CUTANEOUS      Family History: History reviewed. No pertinent family history.   Social History:   reports that she has quit smoking. Her smoking use included cigarettes. She has never used smokeless tobacco. She reports that she does not drink alcohol or use drugs.  Allergies:  Allergies  Allergen Reactions  . Ciprofloxacin Other (See Comments)    GI pain, headache. Pt states she had while she was in hospital (IV) and was ok   . Lactose Other (See Comments)    Intolerant per patient Intolerant per patient   . Levofloxacin   .  Sulfa Antibiotics   . Sulfamethoxazole Other (See Comments)    Other reaction(s): Dizziness (intolerance)  . Aspirin Nausea And Vomiting  . Cephalexin Rash  . Clindamycin Rash  . Erythromycin Base Rash  . Hydromorphone Other (See Comments) and Rash    Rash on back, redness Rash on back, redness   . Penicillins Rash    Medications Prior to Admission  Medication Sig Dispense Refill  . acetaminophen (TYLENOL) 500 MG tablet Take 500-1,000 mg by mouth daily as needed for mild pain or moderate pain.     Marland Kitchen ALPRAZolam (XANAX) 0.5 MG tablet Take 0.5 mg by mouth 3 (three) times daily.   5  . Cholecalciferol (VITAMIN D-1000 MAX ST) 1000 units tablet Take 1,000 Units by mouth daily.     . Cyanocobalamin (B-12-SL) 1000 MCG SUBL Place 1 tablet under the tongue daily.    Marland Kitchen HYDROcodone-acetaminophen (NORCO/VICODIN) 5-325 MG tablet Take 1 tablet by mouth 3 (three) times daily as needed for moderate pain.   0  . Multiple Vitamin (MULTIVITAMIN) capsule Take 1 capsule by mouth daily.     . vitamin C (ASCORBIC ACID) 250 MG tablet Take 500 mg by mouth daily.      Review of Systems:  General Review of Systems: [Y] = yes [N ]=  no  Constitional: nausea Aqua.Slicker ]; night sweats Aqua.Slicker ]; fever Aqua.Slicker ]; or chills [ N];   Resp: cough Aqua.Slicker ]; wheezing[N ]; hemoptysis[ N]; shortness of breath[Y ]; GI: vomiting[ N]; dysphagia[N ] Neuro: stroke[N ];  seizures[Y ] Endocrine: diabetes[ N]; thyroid dysfunction[ N]    BP (!) 114/56 (BP Location: Left Arm)   Pulse 73   Temp 98.3 F (36.8 C) (Oral)   Resp 16   Ht 5' 5"  (1.651 m)   Wt 45.5 kg   SpO2 99%   BMI 16.69 kg/m   Physical Exam: General appearance: alert, cooperative and no distress Neurologic: intact Heart: RRR Lungs: clear to auscultation bilaterally Abdomen: Soft, non tender, bowel sounds present Extremities: No LE edema Chest tube: to suction, no air leak  Diagnostic Studies and Laboratory Results: Results for orders placed or performed during the  hospital encounter of 06/21/18 (from the past 48 hour(s))  CBC with Differential     Status: None   Collection Time: 06/21/18  5:18 PM  Result Value Ref Range   WBC 5.7 4.0 - 10.5 K/uL   RBC 3.99 3.87 - 5.11 MIL/uL   Hemoglobin 12.4 12.0 - 15.0 g/dL   HCT 38.3 36.0 - 46.0 %   MCV 96.0 78.0 - 100.0 fL   MCH 31.1 26.0 - 34.0 pg   MCHC 32.4 30.0 - 36.0 g/dL   RDW 13.3 11.5 - 15.5 %   Platelets 251 150 - 400 K/uL   Neutrophils Relative % 54 %   Neutro Abs 3.1 1.7 - 7.7 K/uL   Lymphocytes Relative 35 %   Lymphs Abs 2.0 0.7 - 4.0 K/uL   Monocytes Relative 8 %   Monocytes Absolute 0.5 0.1 - 1.0 K/uL   Eosinophils Relative 2 %   Eosinophils Absolute 0.1 0.0 - 0.7 K/uL   Basophils Relative 1 %   Basophils Absolute 0.1 0.0 - 0.1 K/uL    Comment: Performed at [facility], [address] [place], [place] [address]  Basic metabolic panel     Status: Abnormal   Collection Time: 06/21/18  5:18 PM  Result Value Ref Range   Sodium 142 135 - 145 mmol/L   Potassium 4.0 3.5 - 5.1 mmol/L   Chloride 108 98 - 111 mmol/L   CO2 24 22 - 32 mmol/L   Glucose, Bld 101 (H) 70 - 99 mg/dL   BUN 12 8 - 23 mg/dL   Creatinine, Ser 0.95 0.44 - 1.00 mg/dL   Calcium 9.4 8.9 - 10.3 mg/dL   GFR calc non Af Amer 56 (L) >60 mL/min   GFR calc Af Amer >60 >60 mL/min    Comment: (NOTE) The eGFR has been calculated using the CKD EPI equation. This calculation has not been validated in all clinical situations. eGFR's persistently <60 mL/min signify possible Chronic Kidney Disease.    Anion gap 10 5 - 15    Comment: Performed at [facility], [address] [place], [place] [address]  Troponin I     Status: None   Collection Time: 06/21/18  5:18 PM  Result Value Ref Range   Troponin I <0.03 <0.03 ng/mL    Comment: Performed at [facility], [address], [place], [place] [address]  Hepatic function panel     Status: None   Collection Time: 06/21/18  5:18 PM  Result Value Ref Range   Total Protein 6.9 6.5 -  8.1 g/dL   Albumin 3.8 3.5 - 5.0 g/dL   AST 21  15 - 41 U/L   ALT 12 0 - 44 U/L   Alkaline Phosphatase 61 38 - 126 U/L   Total Bilirubin 0.6 0.3 - 1.2 mg/dL   Bilirubin, Direct <0.1 0.0 - 0.2 mg/dL   Indirect Bilirubin NOT CALCULATED 0.3 - 0.9 mg/dL    Comment: Performed at [facility], [address], [place], [place] [address]  CBC     Status: Abnormal   Collection Time: 06/21/18 11:43 PM  Result Value Ref Range   WBC 6.8 4.0 - 10.5 K/uL   RBC 3.75 (L) 3.87 - 5.11 MIL/uL   Hemoglobin 11.6 (L) 12.0 - 15.0 g/dL   HCT 36.9 36.0 - 46.0 %   MCV 98.4 78.0 - 100.0 fL   MCH 30.9 26.0 - 34.0 pg   MCHC 31.4 30.0 - 36.0 g/dL   RDW 13.1 11.5 - 15.5 %   Platelets 217 150 - 400 K/uL    Comment: Performed at [facility], [place] [address] [place], [place] [address]  Protime-INR     Status: None   Collection Time: 06/21/18 11:43 PM  Result Value Ref Range   Prothrombin Time 13.8 11.4 - 15.2 seconds   INR 1.07     Comment: Performed at [facility] [address] [place], [place] [address]   Dg Chest 2 View  Result Date: 06/21/2018 CLINICAL DATA:  Shortness of breath since yesterday, cough. History of COPD, bladder cancer. EXAM: CHEST - 2 VIEW COMPARISON:  Chest radiograph July 28, 2017 FINDINGS: RIGHT pneumothorax measuring at least 2.5 cm from chest wall. No mediastinal shift. Cardiomediastinal silhouette is normal. Calcified aortic knob. Increased lung volumes with chronic interstitial changes and biapical pleuroparenchymal scarring. No pleural effusion or focal consolidation. Soft tissue planes and included osseous structures are nonacute. IMPRESSION: 1. Moderate RIGHT pneumothorax.  No mediastinal shift. 2. 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 3.  Aortic Atherosclerosis (ICD10-I70.0). Acute findings discussed with and reconfirmed by Dr.JOSEPH ZAMMIT on 06/21/2018 at 5:55 pm. Electronically Signed   By: Elon Alas M.D.   On: 06/21/2018 17:56   Dg Chest Port 1 View  Result Date:  06/21/2018 CLINICAL DATA:  77 year old female status post chest tube placement. EXAM: PORTABLE CHEST 1 VIEW COMPARISON:  Chest radiograph dated 06/21/2018 FINDINGS: Interval placement of a pigtail right chest tube with re-expansion of the right lung and significant decrease in the pneumothorax. Minimal residual pneumothorax in the upper lobe measures approximately 15 mm to the pleural surface. There is background of severe emphysema. No focal consolidation or pleural effusion. The cardiac silhouette is within normal limits. No acute osseous pathology. IMPRESSION: Interval placement of a right chest tube with significant decrease in pneumothorax. Small residual right upper lobe pneumothorax. Electronically Signed   By: Anner Crete M.D.   On: 06/21/2018 23:27     Assessment/Plan 1. Recurrent spontaneous right pneumothorax-chest tube to remain to suction for now. Will obtain CT of the chest without contrast to evaluate for blebs, as this is a recurrence. She may need right VATS. 2. COPD  Nani Skillern, PA-C 06/22/2018, 8:29 AM  Patient seen and examined on admission and again this morning. 77 yo woman with COPD who presents with right sided CP and SOB. Found to have a recurrent right spontaneous pneumothorax. Pigtail catheter placed with good reexpansion of lung despite being in fissure. No significant air leak. Ct to determine whether VATS is advisable. Pain control DVT prophylaxis Regular diet  Remo Lipps C. Roxan Hockey, MD [facility] [phone])  [phone]

## 2018-06-23 ENCOUNTER — Inpatient Hospital Stay (HOSPITAL_COMMUNITY): Payer: Medicare Other

## 2018-06-23 ENCOUNTER — Other Ambulatory Visit: Payer: Self-pay

## 2018-06-23 MED ORDER — MOMETASONE FURO-FORMOTEROL FUM 100-5 MCG/ACT IN AERO
2.0000 | INHALATION_SPRAY | Freq: Two times a day (BID) | RESPIRATORY_TRACT | Status: DC
  Administered 2018-06-23 – 2018-06-25 (×4): 2 via RESPIRATORY_TRACT
  Filled 2018-06-23: qty 8.8

## 2018-06-23 MED ORDER — DOCUSATE SODIUM 100 MG PO CAPS
100.0000 mg | ORAL_CAPSULE | Freq: Every day | ORAL | Status: DC
  Administered 2018-06-23 – 2018-06-25 (×3): 100 mg via ORAL
  Filled 2018-06-23 (×3): qty 1

## 2018-06-23 MED ORDER — BOOST / RESOURCE BREEZE PO LIQD CUSTOM
237.0000 mL | Freq: Three times a day (TID) | ORAL | Status: DC
  Administered 2018-06-23 – 2018-06-24 (×2): 1 via ORAL
  Filled 2018-06-23 (×2): qty 1

## 2018-06-23 MED ORDER — ALBUTEROL SULFATE (2.5 MG/3ML) 0.083% IN NEBU: 3.0000 mL | INHALATION_SOLUTION | Freq: Four times a day (QID) | RESPIRATORY_TRACT | Status: DC | PRN

## 2018-06-23 NOTE — Progress Notes (Addendum)
[address]       [place] [address]             [phone]        Procedure(s) (LRB): VIDEO ASSISTED THORACOSCOPY (Right) STAPLING OF BLEBS (Right) Subjective: Feels ok, not SOB currently  Objective: Vital signs in last 24 hours: Temp:  [98 F (36.7 C)-99.1 F (37.3 C)] 99.1 F (37.3 C) (09/25 0527) Pulse Rate:  [83-89] 83 (09/25 0527) Cardiac Rhythm: Normal sinus rhythm (09/25 0700) Resp:  [16-18] 16 (09/25 0527) BP: (99-109)/(50-63) 105/63 (09/25 0527) SpO2:  [92 %-99 %] 92 % (09/25 0527) Weight:  [44.6 kg] 44.6 kg (09/25 0527)  Hemodynamic parameters for last 24 hours:    Intake/Output from previous day: 09/24 0701 - 09/25 0700 In: 240 [P.O.:240] Out: 582 [Urine:550; Chest Tube:32] Intake/Output this shift: No intake/output data recorded.  General appearance: alert, cooperative and no distress Heart: regular rate and rhythm Lungs: fair air exchange throughout, no wheeze or rales Abdomen: benign Extremities: no edema or calf tenderness Wound: chest tube site ok  Lab Results: Recent Labs    06/21/18 1718 06/21/18 2343  WBC 5.7 6.8  HGB 12.4 11.6*  HCT 38.3 36.9  PLT 251 217   BMET:  Recent Labs    06/21/18 1718  NA 142  K 4.0  CL 108  CO2 24  GLUCOSE 101*  BUN 12  CREATININE 0.95  CALCIUM 9.4    PT/INR:  Recent Labs    06/21/18 2343  LABPROT 13.8  INR 1.07   ABG No results found for: PHART, HCO3, TCO2, ACIDBASEDEF, O2SAT CBG (last 3)  No results for input(s): GLUCAP in the last 72 hours.  Meds Scheduled Meds: . enoxaparin (LOVENOX) injection  40 mg Subcutaneous Q24H   Continuous Infusions: PRN Meds:.ALPRAZolam, gi cocktail, HYDROcodone-acetaminophen, morphine injection, ondansetron (ZOFRAN) IV, traMADol  Xrays Dg Chest 2 View  Result Date: 06/21/2018 CLINICAL DATA:  Shortness of breath since yesterday, cough. History of COPD, bladder cancer. EXAM: CHEST - 2 VIEW COMPARISON:  Chest radiograph July 28, 2017 FINDINGS: RIGHT pneumothorax measuring at least 2.5 cm from chest wall. No mediastinal shift. Cardiomediastinal silhouette is normal. Calcified aortic knob. Increased lung volumes with chronic interstitial changes and biapical pleuroparenchymal scarring. No pleural effusion or focal consolidation. Soft tissue planes and included osseous structures are nonacute. IMPRESSION: 1. Moderate RIGHT pneumothorax.  No mediastinal shift. 2. 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 3.  Aortic Atherosclerosis (ICD10-I70.0). Acute findings discussed with and reconfirmed by Dr.JOSEPH ZAMMIT on 06/21/2018 at 5:55 pm. Electronically Signed   By: Elon Alas M.D.   On: 06/21/2018 17:56   Ct Chest Wo Contrast  Result Date: 06/22/2018 CLINICAL DATA:  History of pneumothorax, diffuse blebs, continued shortness of breath, right chest tube EXAM: CT CHEST WITHOUT CONTRAST TECHNIQUE: Multidetector CT imaging of the chest was performed following the standard protocol without IV contrast. COMPARISON:  Portable chest x-ray of 06/22/2017 FINDINGS: Cardiovascular: The heart is within normal limits in size and no pericardial effusion is seen. A moderate amount of pericardial and epicardial fat is present. The mid ascending thoracic aorta measures 35 mm in diameter. Moderate thoracic aortic atherosclerosis is present. Mediastinum/Nodes: On this unenhanced study, no mediastinal or hilar adenopathy is seen. The thyroid gland is unremarkable. No hiatal hernia is seen. Lungs/Pleura: On lung window images, there is a small right apical pneumothorax remaining of less than 5%. Right chest tube is unchanged in position. There is opacity peripherally within the apices right  greater than left which may represent scarring. No definite pneumonia or effusion is seen. Diffuse severe changes of centrilobular and paraseptal emphysema are present. The central airway is patent. No suspicious lung nodule or mass is seen. Upper Abdomen: The upper abdomen is  unremarkable on this unenhanced study. Musculoskeletal: The thoracic vertebrae are in normal alignment. No compression deformity is seen. No rib abnormality is evident. IMPRESSION: 1. Small right apical pneumothorax is present of less than 5% with right chest tube remaining. 2. Severe centrilobular and paraseptal emphysema. Electronically Signed   By: Ivar Drape M.D.   On: 06/22/2018 10:05   Dg Chest Port 1 View  Result Date: 06/22/2018 CLINICAL DATA:  Spontaneous pneumothorax, right chest tube in place, follow-up EXAM: PORTABLE CHEST 1 VIEW COMPARISON:  Portable chest x-ray of 06/21/2018 FINDINGS: There is no change in position of the right chest tube. A tiny right apical pneumothorax remains of approximately 5%. The lungs remain hyperaerated consistent with emphysema. No pneumonia or effusion is seen. Apical pleural thickening remains left-greater-than-right. Heart size is stable. IMPRESSION: 1. Right chest tube remains with slight decrease in the small right apical pneumothorax. 2. Hyperaeration consistent with diffuse emphysema. Electronically Signed   By: Ivar Drape M.D.   On: 06/22/2018 10:07   Dg Chest Port 1 View  Result Date: 06/21/2018 CLINICAL DATA:  77 year old female status post chest tube placement. EXAM: PORTABLE CHEST 1 VIEW COMPARISON:  Chest radiograph dated 06/21/2018 FINDINGS: Interval placement of a pigtail right chest tube with re-expansion of the right lung and significant decrease in the pneumothorax. Minimal residual pneumothorax in the upper lobe measures approximately 15 mm to the pleural surface. There is background of severe emphysema. No focal consolidation or pleural effusion. The cardiac silhouette is within normal limits. No acute osseous pathology. IMPRESSION: Interval placement of a right chest tube with significant decrease in pneumothorax. Small residual right upper lobe pneumothorax. Electronically Signed   By: Anner Crete M.D.   On: 06/21/2018 23:27     Assessment/Plan: S/P Procedure(s) (LRB): VIDEO ASSISTED THORACOSCOPY (Right) STAPLING OF BLEBS (Right)  1 afeb, VSS 2 sats good on 2 liters 3 CXR shows re-expansion of lung, no air leak, cont to suction for now 4 Chest CT shows severe emphysematous changes 5 routine pulm toilet 6 hopefully can avoid surgery  LOS: 2 days    John Giovanni 06/23/2018 Pager [phone] Patient seen and examined, agree with above Reviewed CT there is severe diffuse centrilobular and paraseptal emphysema in both lungs. Would be difficult to get a good result with stapling. Would not recommend surgery unless she has frequent repeated pneumos  Remo Lipps C. Roxan Hockey, MD [facility] [phone]

## 2018-06-23 NOTE — Plan of Care (Signed)
  Problem: Education: Goal: Knowledge of General Education information will improve Description Including pain rating scale, medication(s)/side effects and non-pharmacologic comfort measures Outcome: Progressing   Problem: Health Behavior/Discharge Planning: Goal: Ability to manage health-related needs will improve Outcome: Progressing   Problem: Clinical Measurements: Goal: Ability to maintain clinical measurements within normal limits will improve Outcome: Progressing Goal: Will remain free from infection Outcome: Progressing Goal: Diagnostic test results will improve Outcome: Progressing Goal: Respiratory complications will improve Outcome: Progressing Goal: Cardiovascular complication will be avoided Outcome: Progressing   Problem: Activity: Goal: Risk for activity intolerance will decrease Outcome: Progressing   Problem: Nutrition: Goal: Adequate nutrition will be maintained Outcome: Progressing   Problem: Coping: Goal: Level of anxiety will decrease Outcome: Progressing   Problem: Elimination: Goal: Will not experience complications related to bowel motility Outcome: Progressing Goal: Will not experience complications related to urinary retention Outcome: Progressing   Problem: Pain Managment: Goal: General experience of comfort will improve Outcome: Progressing   Problem: Safety: Goal: Ability to remain free from injury will improve Outcome: Progressing   Problem: Skin Integrity: Goal: Risk for impaired skin integrity will decrease Outcome: Progressing   Problem: Respiratory: Goal: Ability to achieve and maintain adequate cardiopulmonary perfusion will improve Outcome: Progressing   Problem: Education: Goal: Knowledge of disease or condition will improve Outcome: Progressing   Problem: Activity: Goal: Ability to tolerate increased activity will improve Outcome: Progressing   Problem: Respiratory: Goal: Levels of oxygenation will  improve Outcome: Progressing Goal: Ability to maintain adequate ventilation will improve Outcome: Progressing

## 2018-06-23 NOTE — Progress Notes (Signed)
Pt/family report not having a good appetite and has not had a BM in couple days.   Page MD to get a booste order and stool softner.   Also-  family unhappy with MD explanation of plan of care.  Still uncertain of what is going on.  Would like to talk to someone again.   MD/NP made aware

## 2018-06-24 ENCOUNTER — Inpatient Hospital Stay (HOSPITAL_COMMUNITY): Payer: Medicare Other

## 2018-06-24 ENCOUNTER — Encounter (HOSPITAL_COMMUNITY): Payer: Self-pay | Admitting: General Practice

## 2018-06-24 MED ORDER — ADULT MULTIVITAMIN W/MINERALS CH
1.0000 | ORAL_TABLET | Freq: Every day | ORAL | Status: DC
  Administered 2018-06-24 – 2018-06-25 (×2): 1 via ORAL
  Filled 2018-06-24 (×2): qty 1

## 2018-06-24 MED ORDER — POLYETHYLENE GLYCOL 3350 17 G PO PACK
17.0000 g | PACK | Freq: Once | ORAL | Status: AC
  Administered 2018-06-24: 17 g via ORAL
  Filled 2018-06-24: qty 1

## 2018-06-24 MED ORDER — BOOST PLUS PO LIQD
237.0000 mL | Freq: Three times a day (TID) | ORAL | Status: DC
  Administered 2018-06-24 – 2018-06-25 (×3): 237 mL via ORAL
  Filled 2018-06-24 (×4): qty 237

## 2018-06-24 NOTE — Progress Notes (Signed)
Pt code status is currently "none on file". No DNR signature form and no advanced directives noted.

## 2018-06-24 NOTE — Progress Notes (Signed)
Chest tube removed per order. Pt stable, no pain, VS stable. Chest tube removed by SWAT nurse Rosario Jacks.

## 2018-06-24 NOTE — Progress Notes (Signed)
Initial Nutrition Assessment  DOCUMENTATION CODES:   Severe malnutrition in context of chronic illness, Underweight  INTERVENTION:   -D/c Boost Breeze po TID, each supplement provides 250 kcal and 9 grams of protein -Boost Plus TID, each supplement provides 360 kcals and 14 grams protein -MVI with minerals daily  NUTRITION DIAGNOSIS:   Severe Malnutrition related to chronic illness(COPD) as evidenced by moderate fat depletion, severe fat depletion, moderate muscle depletion, severe muscle depletion.  GOAL:   Patient will meet greater than or equal to 90% of their needs  MONITOR:   PO intake, Supplement acceptance, Labs, Weight trends, Skin, I & O's  REASON FOR ASSESSMENT:   Other (Comment)(low BMI)    ASSESSMENT:   77 year old Caucasian female with a past medical history of remote tobacco abuse, COPD, bladder cancer who presented to [facility] with complaints of worsening shortness of breath yesterday evening. Chest x ray done yesterday showed a moderate right pneumothorax.  RD pulled to chart due to low BMI.   Pt admitted with rt pneumothorax.  9/23- rt chest tube placed  Reviewed I/O's: -1.5 L x 24 hours and -1.5 L since admission  Chest tube output: 182 ml x 24 hours. Per MD and RN, plan to remove later today.  Spoke with pt and daughter at bedside. Pt reports fair appetite. She typically consumes 5-6 small meals per day (meals include items such as oatmeal and eggs, mashed potatoes and meatloaf). Pt also reports consuming 1-2 Boost supplements per day at home. Pt currently consuming Boost Breeze supplements; per pt and daughter, pt does not tolerate Ensure well, but enjoys chocolate flavored supplements. She endorses good meal intake here; meal completion 75-100%.  Pt reports ongoing weight loss over the past several years. She shares she has always been small framed, however, reports her UBW is around 90#. She has been trying to gain weight recently, with a  goal of 120#. Per wt hx, pt has experienced a 3.8% wt loss over the past 6 months, which while not significant is concerning given underweight status.   Discussed with pt importance of good meal and supplement intake to promote healing.   Labs reviewed.   NUTRITION - FOCUSED PHYSICAL EXAM:    Most Recent Value  Orbital Region  Severe depletion  Upper Arm Region  Severe depletion  Thoracic and Lumbar Region  Severe depletion  Buccal Region  Moderate depletion  Temple Region  Severe depletion  Clavicle Bone Region  Severe depletion  Clavicle and Acromion Bone Region  Severe depletion  Scapular Bone Region  Severe depletion  Dorsal [place]  Moderate depletion  Patellar Region  Severe depletion  Anterior Thigh Region  Severe depletion  Posterior Calf Region  Severe depletion  Edema (RD Assessment)  None  Hair  Reviewed  Eyes  Reviewed  Mouth  Reviewed  Skin  Reviewed  Nails  Reviewed       Diet Order:   Diet Order            Diet Heart Room service appropriate? Yes; Fluid consistency: Thin  Diet effective now              EDUCATION NEEDS:   Education needs have been addressed  Skin:  Skin Assessment: Reviewed RN Assessment  Last BM:  06/22/18  Height:   Ht Readings from Last 1 Encounters:  06/21/18 5\' 5"  (1.651 m)    Weight:   Wt Readings from Last 1 Encounters:  06/24/18 44.5 kg  Ideal Body Weight:  56.8 kg  BMI:  Body mass index is 16.33 kg/m.  Estimated Nutritional Needs:   Kcal:  1550-1750  Protein:  75-90 grams  Fluid:  >1.5 L    Sennie Borden A. Jimmye Norman, RD, LDN, CDE Pager: [phone] After hours Pager: [phone]

## 2018-06-24 NOTE — Progress Notes (Addendum)
Patient with no code status on chart. Zimmeran Donielle PA at beside. Asked her to address pt code status. Patient stated that she wants all measures to be taken as needed. Verbal order given for FULL CODE, per pt request. Order placed.   Domanic Matusek, RN

## 2018-06-24 NOTE — Progress Notes (Signed)
Patient with 11 beats of VT. Patient with stable VS and asymptomatic, no pain. Paged APP Erin for cardiothoracic surgery. No new order at this time. Keep monitor.  Mychal Decarlo, RN

## 2018-06-24 NOTE — Progress Notes (Addendum)
      [address]       ,[place] [address]             [phone]         Procedure(s) (LRB): VIDEO ASSISTED THORACOSCOPY (Right) STAPLING OF BLEBS (Right)  Subjective: Patient has not had a bowel movement in a few days.  Objective: Vital signs in last 24 hours: Temp:  [98.3 F (36.8 C)-98.6 F (37 C)] 98.6 F (37 C) (09/26 0458) Pulse Rate:  [73-80] 78 (09/26 0458) Cardiac Rhythm: Normal sinus rhythm (09/25 1900) Resp:  [16-18] 18 (09/26 0458) BP: (97-113)/(56-60) 113/56 (09/26 0458) SpO2:  [97 %-99 %] 98 % (09/26 0722) Weight:  [44.5 kg] 44.5 kg (09/26 0458)      Intake/Output from previous day: 09/25 0701 - 09/26 0700 In: 960 [P.O.:960] Out: 2437 [Urine:2255; Chest Tube:182]   Physical Exam:  Cardiovascular: RRR Pulmonary: Clear to auscultation bilaterally Extremities: No lower extremity edema. Wounds: Dressing is mostly clean and dry.   Chest Tube: to water seal, no air leak  Lab Results: CBC: Recent Labs    06/21/18 1718 06/21/18 2343  WBC 5.7 6.8  HGB 12.4 11.6*  HCT 38.3 36.9  PLT 251 217   BMET:  Recent Labs    06/21/18 1718  NA 142  K 4.0  CL 108  CO2 24  GLUCOSE 101*  BUN 12  CREATININE 0.95  CALCIUM 9.4    PT/INR:  Recent Labs    06/21/18 2343  LABPROT 13.8  INR 1.07   ABG:  INR: Will add last result for INR, ABG once components are confirmed Will add last 4 CBG results once components are confirmed  Assessment/Plan:  1. CV - SR, ST at times. 2.  Pulmonary - History of COPD. On 2 liters of oxygen via [place]. Chest tube is to water seal and there is no air leak. CXR reviewed with Dr. Roxan Hockey, and ok to remove chest tube. 3. Per patient request, LOC after x ray done.  Donielle M ZimmermanPA-C 06/24/2018,7:32 AM [phone]

## 2018-06-24 NOTE — Discharge Instructions (Signed)

## 2018-06-24 NOTE — Discharge Summary (Addendum)
Physician Discharge Summary       [address]       [place] [address]             [phone]    Patient ID: Amanda [name] MRN: [id number] DOB/AGE: 03-Apr-1941 77 y.o.  Admit date: 06/21/2018 Discharge date: 06/25/2018  Admission Diagnoses: Recurrent right spontaneous pneumothorax  Discharge Diagnoses:  1. History of COPD (chronic obstructive pulmonary disease) ([place]) 2. History of dementia 3. History of Cancer (HCC)-bladder 4. History of seizures ([place]) 5. History of Arthritis 6. History of kidney stones  Procedure (s):  Placement of a 17 French right chest tube by Dr. Roxan Hockey on 06/21/2018.  History of Presenting Illness: This is a 77 year old Caucasian female with a past medical history of remote tobacco abuse, COPD, bladder cancer who presented to [facility] with complaints of worsening shortness of breath yesterday evening. Chest x ray done yesterday showed a moderate right pneumothorax. She was transferred to [facility] and Dr. Roxan Hockey placed a 6 French right chest tube. Follow up chest x ray showed a significant decrease in the right pneumothorax. Of note, patient has had a right spontaneous pneumothorax at the end of September and was taken care of at [facility] in [place].  Currently, patient is no acute distress, chest tube is to suction and there is no air leak, and oxygenation is 99% on 2 liters of oxygen via [place].  Brief Hospital Course:  She has remained afebrile and hemodynamically stable. CT scan of the chest showed small right apical pneumothorax is present of less than 5% with right chest tube remaining and severe centrilobular and paraseptal emphysema. Chest tube did not have an air leak and daily chest x rays were obtained and remained stable. Chest tube was placed to water seal on 09/25. Ailene Ards discussed with patient and family that it would be difficult to get a good result with stapling. He would not  recommend surgery unless she has more frequent, repeated pneumothoraces. Chest tube was removed on 06/25/2018. Chest x ray today is stable (COPD, bilateral pleural-parenchymal thickening consistent with scarring). She has been on 2 liters of oxygen via [place] since admission. Nurse just walked patient on room and oxygen saturation was 93%. She does not need home oxygen. I did discuss with patient if she develops worsening shortness of breath or chest pain, to call 911 asap. As discussed with Dr. Roxan Hockey, she is stable for discharge today.  Latest Vital Signs: Blood pressure (!) 107/56, pulse 89, temperature 98.3 F (36.8 C), temperature source Oral, resp. rate 18, height 5\' 5"  (1.651 m), weight 46.5 kg, SpO2 97 %.  Physical Exam: Cardiovascular: RRR Pulmonary: Clear to auscultation bilaterally Extremities: No lower extremity edema. Wounds: Dressing is mostly clean and dry.     Discharge Condition: Stable and discharged to home.  Recent laboratory studies:  Lab Results  Component Value Date   WBC 6.8 06/21/2018   HGB 11.6 (L) 06/21/2018   HCT 36.9 06/21/2018   MCV 98.4 06/21/2018   PLT 217 06/21/2018   Lab Results  Component Value Date   NA 142 06/21/2018   K 4.0 06/21/2018   CL 108 06/21/2018   CO2 24 06/21/2018   CREATININE 0.95 06/21/2018   GLUCOSE 101 (H) 06/21/2018      Diagnostic Studies: Dg Chest 2 View  Result Date: 06/25/2018 CLINICAL DATA:  Shortness of breath.  Follow-up pneumothorax. EXAM: CHEST - 2 VIEW COMPARISON:  06/24/2018. FINDINGS: Mediastinum  hilar structures normal. Heart size normal. No focal infiltrate. COPD. Bilateral pleural-parenchymal thickening noted consistent with scarring. IMPRESSION: 1.  COPD.  No acute infiltrate. 2. Bilateral pleural-parenchymal thickening consistent with scarring. Electronically Signed   By: Marcello Moores  Register   On: 06/25/2018 10:06   Dg Chest 2 View  Result Date: 06/21/2018 CLINICAL DATA:  Shortness of breath since yesterday,  cough. History of COPD, bladder cancer. EXAM: CHEST - 2 VIEW COMPARISON:  Chest radiograph July 28, 2017 FINDINGS: RIGHT pneumothorax measuring at least 2.5 cm from chest wall. No mediastinal shift. Cardiomediastinal silhouette is normal. Calcified aortic knob. Increased lung volumes with chronic interstitial changes and biapical pleuroparenchymal scarring. No pleural effusion or focal consolidation. Soft tissue planes and included osseous structures are nonacute. IMPRESSION: 1. Moderate RIGHT pneumothorax.  No mediastinal shift. 2. 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 3.  Aortic Atherosclerosis (ICD10-I70.0). Acute findings discussed with and reconfirmed by Dr.JOSEPH ZAMMIT on 06/21/2018 at 5:55 pm. Electronically Signed   By: Elon Alas M.D.   On: 06/21/2018 17:56   Ct Chest Wo Contrast  Result Date: 06/22/2018 CLINICAL DATA:  History of pneumothorax, diffuse blebs, continued shortness of breath, right chest tube EXAM: CT CHEST WITHOUT CONTRAST TECHNIQUE: Multidetector CT imaging of the chest was performed following the standard protocol without IV contrast. COMPARISON:  Portable chest x-ray of 06/22/2017 FINDINGS: Cardiovascular: The heart is within normal limits in size and no pericardial effusion is seen. A moderate amount of pericardial and epicardial fat is present. The mid ascending thoracic aorta measures 35 mm in diameter. Moderate thoracic aortic atherosclerosis is present. Mediastinum/Nodes: On this unenhanced study, no mediastinal or hilar adenopathy is seen. The thyroid gland is unremarkable. No hiatal hernia is seen. Lungs/Pleura: On lung window images, there is a small right apical pneumothorax remaining of less than 5%. Right chest tube is unchanged in position. There is opacity peripherally within the apices right greater than left which may represent scarring. No definite pneumonia or effusion is seen. Diffuse severe changes of centrilobular and paraseptal emphysema are present. The central  airway is patent. No suspicious lung nodule or mass is seen. Upper Abdomen: The upper abdomen is unremarkable on this unenhanced study. Musculoskeletal: The thoracic vertebrae are in normal alignment. No compression deformity is seen. No rib abnormality is evident. IMPRESSION: 1. Small right apical pneumothorax is present of less than 5% with right chest tube remaining. 2. Severe centrilobular and paraseptal emphysema. Electronically Signed   By: Ivar Drape M.D.   On: 06/22/2018 10:05   Dg Chest Port 1 View  Result Date: 06/24/2018 CLINICAL DATA:  Follow-up spontaneous pneumothorax EXAM: PORTABLE CHEST 1 VIEW COMPARISON:  06/23/2018 FINDINGS: Stable pigtail catheter on the right is noted. Tiny right apical pneumothorax is again identified and stable. No new focal infiltrate or sizable effusion is seen. No acute bony abnormality is noted. IMPRESSION: Stable right apical pneumothorax. Electronically Signed   By: Inez Catalina M.D.   On: 06/24/2018 09:59   Dg Chest Port 1 View  Result Date: 06/23/2018 CLINICAL DATA:  Pneumothorax. EXAM: PORTABLE CHEST 1 VIEW COMPARISON:  Radiograph of June 22, 2018. FINDINGS: Stable cardiomediastinal silhouette. Atherosclerosis of thoracic aorta is noted. Stable left apical scarring is noted. Right-sided chest tube is unchanged in position. Stable minimal right apical pneumothorax is noted. Emphysematous disease is again noted in both upper lobes. No acute consolidative process is noted. Bony thorax is unremarkable. IMPRESSION: Right-sided chest tube is unchanged in position. Stable minimal right apical pneumothorax. Aortic Atherosclerosis (ICD10-I70.0) and Emphysema (ICD10-J43.9). Electronically  Signed   By: Marijo Conception, M.D.   On: 06/23/2018 11:14   Dg Chest Port 1 View  Result Date: 06/22/2018 CLINICAL DATA:  Spontaneous pneumothorax, right chest tube in place, follow-up EXAM: PORTABLE CHEST 1 VIEW COMPARISON:  Portable chest x-ray of 06/21/2018 FINDINGS: There  is no change in position of the right chest tube. A tiny right apical pneumothorax remains of approximately 5%. The lungs remain hyperaerated consistent with emphysema. No pneumonia or effusion is seen. Apical pleural thickening remains left-greater-than-right. Heart size is stable. IMPRESSION: 1. Right chest tube remains with slight decrease in the small right apical pneumothorax. 2. Hyperaeration consistent with diffuse emphysema. Electronically Signed   By: Ivar Drape M.D.   On: 06/22/2018 10:07   Dg Chest Port 1 View  Result Date: 06/21/2018 CLINICAL DATA:  77 year old female status post chest tube placement. EXAM: PORTABLE CHEST 1 VIEW COMPARISON:  Chest radiograph dated 06/21/2018 FINDINGS: Interval placement of a pigtail right chest tube with re-expansion of the right lung and significant decrease in the pneumothorax. Minimal residual pneumothorax in the upper lobe measures approximately 15 mm to the pleural surface. There is background of severe emphysema. No focal consolidation or pleural effusion. The cardiac silhouette is within normal limits. No acute osseous pathology. IMPRESSION: Interval placement of a right chest tube with significant decrease in pneumothorax. Small residual right upper lobe pneumothorax. Electronically Signed   By: Anner Crete M.D.   On: 06/21/2018 23:27      Discharge Medications: Allergies as of 06/25/2018      Reactions   Ciprofloxacin Other (See Comments)   GI pain, headache. Pt states she had while she was in hospital (IV) and was ok    Lactose Other (See Comments)   Intolerant per patient Intolerant per patient   Levofloxacin    Sulfa Antibiotics    Sulfamethoxazole Other (See Comments)   Other reaction(s): Dizziness (intolerance)   Aspirin Nausea And Vomiting   Cephalexin Rash   Clindamycin Rash   Erythromycin Base Rash   Hydromorphone Other (See Comments), Rash   Rash on back, redness Rash on back, redness   Penicillins Rash        Medication List    TAKE these medications   acetaminophen 500 MG tablet Commonly known as:  TYLENOL Take 500-1,000 mg by mouth daily as needed for mild pain or moderate pain.   ALPRAZolam 0.5 MG tablet Commonly known as:  XANAX Take 0.5 mg by mouth 3 (three) times daily.   B-12-SL 1000 MCG Subl Generic drug:  Cyanocobalamin Place 1 tablet under the tongue daily.   HYDROcodone-acetaminophen 5-325 MG tablet Commonly known as:  NORCO/VICODIN Take 1 tablet by mouth every 4 (four) hours as needed for severe pain. What changed:    when to take this  reasons to take this   mometasone-formoterol 100-5 MCG/ACT Aero Commonly known as:  DULERA Inhale 2 puffs into the lungs 2 (two) times daily.   multivitamin capsule Take 1 capsule by mouth daily.   vitamin C 250 MG tablet Commonly known as:  ASCORBIC ACID Take 500 mg by mouth daily.   VITAMIN D-1000 MAX ST 1000 units tablet Generic drug:  Cholecalciferol Take 1,000 Units by mouth daily.       Follow Up Appointments: Follow-up Information    [name], MD. Go on 07/06/2018.   Specialty:  Cardiothoracic Surgery Why:  PA/LAT CXR to be taken (at [facility] which is in the same building as Dr. Leonarda Salon office)  on 07/06/2018 at 12:15 pm;Appointment time is at 12:45 pm Contact information: [address] [place] [address] [phone]           Signed: Sharalyn Ink [facility] 06/25/2018, 10:19 AM

## 2018-06-24 NOTE — Care Management Important Message (Signed)
Important Message  Patient Details  Name: Amanda [name] MRN: [id number] Date of Birth: 26-Jul-1941   Medicare Important Message Given:  Yes    Amanda [name] 06/24/2018, 4:13 PM

## 2018-06-25 ENCOUNTER — Encounter (HOSPITAL_COMMUNITY)
Admission: EM | Disposition: A | Discharge status: Home / Self Care | Payer: Self-pay | Source: Home / Self Care | Attending: Thoracic Surgery (Cardiothoracic Vascular Surgery)

## 2018-06-25 ENCOUNTER — Inpatient Hospital Stay (HOSPITAL_COMMUNITY): Payer: Medicare Other

## 2018-06-25 DIAGNOSIS — E43 Unspecified severe protein-calorie malnutrition: Secondary | ICD-10-CM

## 2018-06-25 SURGERY — VIDEO ASSISTED THORACOSCOPY
Anesthesia: General | Site: Chest | Laterality: Right

## 2018-06-25 MED ORDER — MOMETASONE FURO-FORMOTEROL FUM 100-5 MCG/ACT IN AERO: 2.0000 | INHALATION_SPRAY | Freq: Two times a day (BID) | RESPIRATORY_TRACT | 0 refills | Status: DC

## 2018-06-25 MED ORDER — HYDROCODONE-ACETAMINOPHEN 5-325 MG PO TABS: 1.0000 | ORAL_TABLET | ORAL | 0 refills | Status: DC | PRN

## 2018-06-25 NOTE — Progress Notes (Signed)
SATURATION QUALIFICATIONS: (This note is used to comply with regulatory documentation for home oxygen)  Patient Saturations on Room Air at Rest = 98%  Patient Saturations on Room Air while Ambulating = 93%   

## 2018-06-25 NOTE — Progress Notes (Addendum)
      [address]       [place] [address]             [phone]         Procedure(s) (LRB): VIDEO ASSISTED THORACOSCOPY (Right) STAPLING OF BLEBS (Right)  Subjective: Patient passing flatus but no bowel movement yet. She thinks she will have one today. She denies breathing difficulties this am.  Objective: Vital signs in last 24 hours: Temp:  [97.6 F (36.4 C)-98.4 F (36.9 C)] 98.3 F (36.8 C) (09/27 0602) Pulse Rate:  [79-89] 81 (09/27 0602) Cardiac Rhythm: Normal sinus rhythm (09/26 1900) Resp:  [16-18] 18 (09/27 0602) BP: (100-121)/(46-58) 100/58 (09/27 0602) SpO2:  [96 %-100 %] 96 % (09/27 0602) Weight:  [46.5 kg] 46.5 kg (09/27 0313)      Intake/Output from previous day: 09/26 0701 - 09/27 0700 In: 1077 [P.O.:1077] Out: 1640 [Urine:1600; Chest Tube:40]   Physical Exam:  Cardiovascular: RRR Pulmonary: Clear to auscultation bilaterally Extremities: No lower extremity edema. Wounds: Dressing is mostly clean and dry.     Lab Results: CBC: No results for input(s): WBC, HGB, HCT, PLT in the last 72 hours. BMET:  No results for input(s): NA, K, CL, CO2, GLUCOSE, BUN, CREATININE, CALCIUM in the last 72 hours.  PT/INR:  No results for input(s): LABPROT, INR in the last 72 hours. ABG:  INR: Will add last result for INR, ABG once components are confirmed Will add last 4 CBG results once components are confirmed  Assessment/Plan:  1. CV - SR, ST at times. 2.  Pulmonary - History of COPD. On 2 liters of oxygen via [place]. Chest tube removed yesterday. CXR this am  ordered but not taken yet. Will ask nurse to wean to room air as well. 3. Possibly discharge later today if CXR stable  Sharalyn Ink ZimmermanPA-C 06/25/2018,7:32 AM [phone]  Patient seen and examined, agree with above Home today  Revonda Standard. Roxan Hockey, MD [facility] [phone]

## 2018-06-25 NOTE — Progress Notes (Signed)
Discussed discharge instructions with patient and daughter, including medications and follow up appointments.  Sent instructions home with family.

## 2018-07-05 ENCOUNTER — Other Ambulatory Visit: Payer: Self-pay | Admitting: Thoracic Surgery (Cardiothoracic Vascular Surgery)

## 2018-07-05 DIAGNOSIS — J9383 Other pneumothorax: Secondary | ICD-10-CM

## 2018-07-06 ENCOUNTER — Ambulatory Visit
Admission: RE | Admit: 2018-07-06 | Discharge: 2018-07-06 | Disposition: A | Discharge status: Home / Self Care | Payer: Medicare Other | Source: Ambulatory Visit | Attending: Thoracic Surgery (Cardiothoracic Vascular Surgery) | Admitting: Thoracic Surgery (Cardiothoracic Vascular Surgery)

## 2018-07-06 ENCOUNTER — Encounter: Payer: Self-pay | Admitting: Thoracic Surgery (Cardiothoracic Vascular Surgery)

## 2018-07-06 ENCOUNTER — Other Ambulatory Visit: Payer: Self-pay

## 2018-07-06 ENCOUNTER — Ambulatory Visit: Payer: Medicare Other | Admitting: Thoracic Surgery (Cardiothoracic Vascular Surgery)

## 2018-07-06 VITALS — BP 120/66 | HR 78 | Resp 16 | Ht 65.0 in | Wt 105.0 lb

## 2018-07-06 DIAGNOSIS — J432 Centrilobular emphysema: Secondary | ICD-10-CM | POA: Diagnosis not present

## 2018-07-06 DIAGNOSIS — J9383 Other pneumothorax: Secondary | ICD-10-CM

## 2018-07-06 DIAGNOSIS — Z9889 Other specified postprocedural states: Secondary | ICD-10-CM | POA: Diagnosis not present

## 2018-07-06 DIAGNOSIS — J439 Emphysema, unspecified: Secondary | ICD-10-CM | POA: Insufficient documentation

## 2018-07-06 DIAGNOSIS — J449 Chronic obstructive pulmonary disease, unspecified: Secondary | ICD-10-CM | POA: Insufficient documentation

## 2018-07-06 DIAGNOSIS — J939 Pneumothorax, unspecified: Secondary | ICD-10-CM | POA: Diagnosis not present

## 2018-07-06 NOTE — Progress Notes (Signed)
[address]       [place], [address]             [phone]       HPI: Mrs. Lalley returns for a scheduled follow-up visit after a spontaneous pneumothorax.  Joell Buerger is a 77 year old woman with history of remote tobacco abuse, COPD, and bladder cancer.  She presented with right-sided chest pain and shortness of breath.  The shortness of breath was the predominant complaint.  She was found to have a right spontaneous pneumothorax.  That was treated with a 14 [place] pigtail.  Her air leak resolved and she did not require surgical intervention.  This was actually her second pneumothorax on the right side.  She previously had been treated in [place].  I did a CT of the chest to assess for potential for surgical intervention.  She has severe intralobular and paraseptal emphysema.  I recommended that we not pursue surgery.  She has been feeling well since she was discharged.  She has not had any recurrent shortness of breath.  She has had a cough which she attributes to nasal congestion.  Past Medical History:  Diagnosis Date  . Arthritis   . Blood transfusion without reported diagnosis   . Cancer ([place])    bladder  . COPD (chronic obstructive pulmonary disease) ([place])   . Renal disorder    kidney stones  . Seizures ([place])   . Spontaneous pneumothorax 05/2018   right    Current Outpatient Medications  Medication Sig Dispense Refill  . acetaminophen (TYLENOL) 500 MG tablet Take 500-1,000 mg by mouth daily as needed for mild pain or moderate pain.     Marland Kitchen ALPRAZolam (XANAX) 0.5 MG tablet Take 0.5 mg by mouth daily.   5  . Cholecalciferol (VITAMIN D-1000 MAX ST) 1000 units tablet Take 1,000 Units by mouth daily.     . Cyanocobalamin (B-12-SL) 1000 MCG SUBL Place 1 tablet under the tongue daily.    Marland Kitchen HYDROcodone-acetaminophen (NORCO/VICODIN) 5-325 MG tablet Take 1 tablet by mouth every 4 (four) hours as needed for severe pain. (Patient taking differently: Take 1 tablet by  mouth 2 (two) times daily. ) 10 tablet 0  . mometasone-formoterol (DULERA) 100-5 MCG/ACT AERO Inhale 2 puffs into the lungs 2 (two) times daily. 1 Inhaler 0  . Multiple Vitamin (MULTIVITAMIN) capsule Take 1 capsule by mouth daily.     . vitamin C (ASCORBIC ACID) 250 MG tablet Take 500 mg by mouth daily.      No current facility-administered medications for this visit.     Physical Exam BP 120/66 (BP Location: Right Arm, Patient Position: Sitting, Cuff Size: Normal)   Pulse 78   Resp 16   Ht 5\' 5"  (1.651 m)   Wt 105 lb (47.6 kg)   SpO2 99% Comment: ON RA  BMI 17.29 kg/m  77 year old woman in no acute distress Alert and oriented x3 with no focal deficits Lungs diminished but equal bilaterally, no wheezing Cardiac regular rate and rhythm  Diagnostic Tests: CHEST - 2 VIEW  COMPARISON:  Portable chest x-ray of June 25, 2018  FINDINGS: The lungs remain hyperinflated. No residual pneumothorax is observed. There is no pneumomediastinum or pleural effusion. There is stable biapical pleural thickening. The interstitial markings of both lungs are coarse. The heart and pulmonary vascularity are normal. There is calcification in the wall of the aortic arch and descending thoracic aorta. The bony thorax is unremarkable.  IMPRESSION: COPD. No  recurrent pneumothorax. Stable chronic pleuroparenchymal scarring especially in the apices.  Thoracic aortic atherosclerosis.   Electronically Signed   By: David  Martinique M.D.   On: 07/06/2018 13:25 I personally reviewed the chest x-ray images and concur with the findings noted above.  Impression: Amanda [name] is a 77 year old former smoker who has known COPD.  She presented with shortness of breath and was found to have a recurrent right spontaneous pneumothorax.  She was treated with a chest tube.  Her air leak stopped fairly quickly and she was able to be discharged home.  COPD-CT showed severe centrilobular and paraseptal  emphysema.  Continue Dulera  She is not a good surgical candidate due to the diffuse nature of her disease.  I would only recommend surgery if she had a recurrent pneumothorax and the air leak would not stop.  She and her daughter understand that she is at high risk for recurrent pneumothorax.  Plan: I will be happy to see Mrs. him back anytime the future if I can be of any further assistance with her care  [name], MD [facility] [phone]

## 2018-09-29 ENCOUNTER — Emergency Department (HOSPITAL_COMMUNITY): Payer: Medicare Other

## 2018-09-29 ENCOUNTER — Encounter (HOSPITAL_COMMUNITY): Payer: Self-pay | Admitting: Emergency Medicine

## 2018-09-29 ENCOUNTER — Other Ambulatory Visit: Payer: Self-pay

## 2018-09-29 ENCOUNTER — Emergency Department (HOSPITAL_COMMUNITY)
Admission: EM | Admit: 2018-09-29 | Discharge: 2018-09-29 | Disposition: A | Discharge status: Home / Self Care | Payer: Medicare Other | Attending: Emergency Medicine | Admitting: Emergency Medicine

## 2018-09-29 DIAGNOSIS — F17210 Nicotine dependence, cigarettes, uncomplicated: Secondary | ICD-10-CM | POA: Insufficient documentation

## 2018-09-29 DIAGNOSIS — Z79899 Other long term (current) drug therapy: Secondary | ICD-10-CM | POA: Insufficient documentation

## 2018-09-29 DIAGNOSIS — R0602 Shortness of breath: Secondary | ICD-10-CM | POA: Diagnosis present

## 2018-09-29 DIAGNOSIS — J441 Chronic obstructive pulmonary disease with (acute) exacerbation: Secondary | ICD-10-CM | POA: Diagnosis not present

## 2018-09-29 LAB — BASIC METABOLIC PANEL
Anion gap: 5 (ref 5–15)
BUN: 18 mg/dL (ref 8–23)
CALCIUM: 9.3 mg/dL (ref 8.9–10.3)
CO2: 25 mmol/L (ref 22–32)
CREATININE: 0.98 mg/dL (ref 0.44–1.00)
Chloride: 109 mmol/L (ref 98–111)
GFR calc Af Amer: >60 mL/min (ref >60)
GFR, EST NON AFRICAN AMERICAN: 56 mL/min — AB (ref >60)
Glucose, Bld: 104 mg/dL — ABNORMAL HIGH (ref 70–99)
Potassium: 4.1 mmol/L (ref 3.5–5.1)
SODIUM: 139 mmol/L (ref 135–145)

## 2018-09-29 LAB — CBC WITH DIFFERENTIAL/PLATELET
Abs Immature Granulocytes: 0.01 10*3/uL (ref 0.00–0.07)
Basophils Absolute: 0.1 10*3/uL (ref 0.0–0.1)
Basophils Relative: 1 %
EOS ABS: 0.1 10*3/uL (ref 0.0–0.5)
EOS PCT: 1 %
HCT: 37.6 % (ref 36.0–46.0)
HEMOGLOBIN: 12 g/dL (ref 12.0–15.0)
Immature Granulocytes: 0 %
LYMPHS PCT: 32 %
Lymphs Abs: 1.5 10*3/uL (ref 0.7–4.0)
MCH: 30.5 pg (ref 26.0–34.0)
MCHC: 31.9 g/dL (ref 30.0–36.0)
MCV: 95.4 fL (ref 80.0–100.0)
MONO ABS: 0.4 10*3/uL (ref 0.1–1.0)
Monocytes Relative: 8 %
Neutro Abs: 2.7 10*3/uL (ref 1.7–7.7)
Neutrophils Relative %: 58 %
Platelets: 239 10*3/uL (ref 150–400)
RBC: 3.94 MIL/uL (ref 3.87–5.11)
RDW: 12.9 % (ref 11.5–15.5)
WBC: 4.8 10*3/uL (ref 4.0–10.5)
nRBC: 0 % (ref 0.0–0.2)

## 2018-09-29 LAB — BRAIN NATRIURETIC PEPTIDE: B Natriuretic Peptide: 58 pg/mL (ref 0.0–100.0)

## 2018-09-29 LAB — TROPONIN I

## 2018-09-29 MED ORDER — MOMETASONE FURO-FORMOTEROL FUM 100-5 MCG/ACT IN AERO: 2.0000 | INHALATION_SPRAY | Freq: Two times a day (BID) | RESPIRATORY_TRACT | 0 refills | Status: DC

## 2018-09-29 MED ORDER — PREDNISONE 20 MG PO TABS: 60.0000 mg | ORAL_TABLET | Freq: Every day | ORAL | 0 refills | Status: DC

## 2018-09-29 MED ORDER — ALBUTEROL SULFATE HFA 108 (90 BASE) MCG/ACT IN AERS: 1.0000 | INHALATION_SPRAY | Freq: Four times a day (QID) | RESPIRATORY_TRACT | 0 refills | Status: AC | PRN

## 2018-09-29 MED ORDER — ALBUTEROL SULFATE HFA 108 (90 BASE) MCG/ACT IN AERS
2.0000 | INHALATION_SPRAY | Freq: Once | RESPIRATORY_TRACT | Status: AC
  Administered 2018-09-29: 2 via RESPIRATORY_TRACT
  Filled 2018-09-29: qty 6.7

## 2018-09-29 MED ORDER — METHYLPREDNISOLONE SODIUM SUCC 125 MG IJ SOLR
125.0000 mg | Freq: Once | INTRAMUSCULAR | Status: AC
  Administered 2018-09-29: 125 mg via INTRAVENOUS
  Filled 2018-09-29: qty 2

## 2018-09-29 MED ORDER — IPRATROPIUM-ALBUTEROL 0.5-2.5 (3) MG/3ML IN SOLN
3.0000 mL | Freq: Once | RESPIRATORY_TRACT | Status: AC
  Administered 2018-09-29: 3 mL via RESPIRATORY_TRACT
  Filled 2018-09-29: qty 3
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
## 2018-09-29 NOTE — ED Notes (Signed)
Given mouth moisturizer

## 2018-09-29 NOTE — Discharge Instructions (Addendum)
You were evaluated in the emergency department for worsening shortness of breath.  Your chest x-ray did not show any pneumothorax.  Your blood work and EKG did not show any obvious heart injury.  He should continue to use the albuterol inhaler 2 puffs every 4-6 hours as needed.  We are also prescribing you prednisone to start tomorrow for 4 days.  Please follow-up with your doctor and return if any worsening symptoms.

## 2018-09-29 NOTE — ED Triage Notes (Signed)
Pt reports productive cough ongoing for "quite a while."  Became increasingly short of breath last night.  Does not wear home oxygen.

## 2018-09-29 NOTE — ED Notes (Signed)
Pt ambulated around ED.   o2 sats stayed at 100%.  HR was 102.  Pt did not c/o any pain or discomfort. Gait was good.

## 2018-09-29 NOTE — ED Provider Notes (Signed)
[facility] EMERGENCY DEPARTMENT Provider Note   CSN: [id number] Arrival date & time: 09/29/18  1137     History   Chief Complaint Chief Complaint  Patient presents with  . Shortness of Breath    HPI Amanda [name] is a 78 y.o. female.  She is a prior history of COPD and she has had 2 spontaneous pneumothoraces.  She is complaining of a few days of increased shortness of breath.  It is worse with exertion.  There is been a cough mostly white but some yellow sputum no fevers or chills.  No chest pain no abdominal pain.  No recent falls.  She does not use oxygen has not been on steroids.  She said the doctor had given her a prescription for an inhaler but she lost the prescription so does not use it.  The history is provided by the patient.  Shortness of Breath  This is a recurrent problem. The current episode started more than 2 days ago. The problem has not changed since onset.Associated symptoms include cough and sputum production. Pertinent negatives include no fever, no headaches, no coryza, no rhinorrhea, no sore throat, no neck pain, no hemoptysis, no chest pain, no vomiting, no abdominal pain, no rash and no leg pain. It is unknown what precipitated the problem. She has tried nothing for the symptoms. The treatment provided no relief.    Past Medical History:  Diagnosis Date  . Arthritis   . Blood transfusion without reported diagnosis   . Cancer ([place])    bladder  . COPD (chronic obstructive pulmonary disease) ([place])   . Renal disorder    kidney stones  . Seizures ([place])   . Spontaneous pneumothorax 05/2018   right    Patient Active Problem List   Diagnosis Date Noted  . 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 Protein-calorie malnutrition, severe 06/25/2018  . Dementia ([place]) 06/22/2018  . SOB (shortness of breath) 06/21/2018  . Spontaneous pneumothorax 06/21/2018    Past Surgical History:  Procedure Laterality Date  . ABDOMINAL HYSTERECTOMY    . REVISION UROSTOMY  CUTANEOUS       OB History   No obstetric history on file.      Home Medications    Prior to Admission medications   Medication Sig Start Date End Date Taking? Authorizing Provider  acetaminophen (TYLENOL) 500 MG tablet Take 500-1,000 mg by mouth daily as needed for mild pain or moderate pain.     [provider]  ALPRAZolam Duanne Moron) 0.5 MG tablet Take 0.5 mg by mouth daily.     [provider]  Cholecalciferol (VITAMIN D-1000 MAX ST) 1000 units tablet Take 1,000 Units by mouth daily.     [provider]  Cyanocobalamin (B-12-SL) 1000 MCG SUBL Place 1 tablet under the tongue daily.    [provider]  HYDROcodone-acetaminophen (NORCO/VICODIN) 5-325 MG tablet Take 1 tablet by mouth every 4 (four) hours as needed for severe pain. Patient taking differently: Take 1 tablet by mouth 2 (two) times daily.  06/25/18   Lars Pinks M, PA-C  mometasone-formoterol (DULERA) 100-5 MCG/ACT AERO Inhale 2 puffs into the lungs 2 (two) times daily. 06/25/18   Nani Skillern, PA-C  Multiple Vitamin (MULTIVITAMIN) capsule Take 1 capsule by mouth daily.     [provider]  vitamin C (ASCORBIC ACID) 250 MG tablet Take 500 mg by mouth daily.     [provider]    Family History History reviewed. No pertinent family history.  Social  History Social History   Tobacco Use  . Smoking status: Current Some Day Smoker    Types: Cigarettes  . Smokeless tobacco: Never Used  Substance Use Topics  . Alcohol use: Never    Frequency: Never  . Drug use: Never     Allergies   Ciprofloxacin; Lactose; Levofloxacin; Sulfa antibiotics; Sulfamethoxazole; Aspirin; Cephalexin; Clindamycin; Erythromycin base; Hydromorphone; and Penicillins   Review of Systems Review of Systems  Constitutional: Negative for fever.  HENT: Negative for rhinorrhea and sore throat.   Eyes: Negative for visual disturbance.  Respiratory: Positive for cough, sputum  production and shortness of breath. Negative for hemoptysis.   Cardiovascular: Negative for chest pain.  Gastrointestinal: Positive for diarrhea. Negative for abdominal pain, nausea and vomiting.  Genitourinary: Negative for dysuria.  Musculoskeletal: Negative for neck pain.  Skin: Negative for rash.  Neurological: Negative for headaches.     Physical Exam Updated Vital Signs BP (!) 143/65 (BP Location: Right Arm)   Pulse 89   Temp 98.4 F (36.9 C) (Oral)   Resp (!) 22   Ht 5\' 5"  (1.651 m)   Wt 49.9 kg   SpO2 97%   BMI 18.30 kg/m   Physical Exam Vitals signs and nursing note reviewed.  Constitutional:      General: She is not in acute distress.    Appearance: She is well-developed.  HENT:     Head: Normocephalic and atraumatic.  Eyes:     Conjunctiva/sclera: Conjunctivae normal.  Neck:     Musculoskeletal: Neck supple.  Cardiovascular:     Rate and Rhythm: Normal rate and regular rhythm.     Heart sounds: No murmur.  Pulmonary:     Effort: Pulmonary effort is normal. No respiratory distress.     Breath sounds: Decreased breath sounds (diffuse) present.  Abdominal:     Palpations: Abdomen is soft.     Tenderness: There is no abdominal tenderness.  Musculoskeletal: Normal range of motion.     Right lower leg: She exhibits no tenderness. No edema.     Left lower leg: She exhibits no tenderness. No edema.  Skin:    General: Skin is warm and dry.     Capillary Refill: Capillary refill takes less than 2 seconds.  Neurological:     General: No focal deficit present.     Mental Status: She is alert.      ED Treatments / Results  Labs (all labs ordered are listed, but only abnormal results are displayed) Labs Reviewed  BASIC METABOLIC PANEL - Abnormal; Notable for the following components:      Result Value   Glucose, Bld 104 (*)    GFR calc non Af Amer 56 (*)    All other components within normal limits  CBC WITH DIFFERENTIAL/PLATELET  TROPONIN I  BRAIN  NATRIURETIC PEPTIDE    EKG EKG Interpretation  Date/Time:  Wednesday September 29 2018 11:48:11 EST Ventricular Rate:  96 PR Interval:    QRS Duration: 91 QT Interval:  358 QTC Calculation: 453 R Axis:   87 Text Interpretation:  Sinus rhythm Right atrial enlargement Borderline right axis deviation similar to prior 9/19 Confirmed by Aletta Edouard [phone]) on 09/29/2018 12:03:35 PM   Radiology Dg Chest 2 View  Result Date: 09/29/2018 CLINICAL DATA:  SOB HISTORY OF PTX, PATIENT STATES "SOB STARTED TODAY", PER ER NOTE, Pt reports productive cough ongoing for "quite a while." Became increasingly short of breath last night. Does not wear home oxygen HISTORY OF CANCER, SEIZURES, PNEUMOTHORAX,  COPD EXAM: CHEST - 2 VIEW COMPARISON:  07/06/2018 FINDINGS: Lungs hyperinflated with coarse bronchovascular markings. Asymmetric biapical pleuroparenchymal scarring left greater than right as before. No new infiltrate or overt edema. Heart size and mediastinal contours are within normal limits. Aortic Atherosclerosis (ICD10-170.0). No effusion. Visualized bones unremarkable. IMPRESSION: Hyperinflation without acute or superimposed abnormality. Electronically Signed   By: Lucrezia Europe M.D.   On: 09/29/2018 12:33    Procedures Procedures (including critical care time)  Medications Ordered in ED Medications  ipratropium-albuterol (DUONEB) 0.5-2.5 (3) MG/3ML nebulizer solution 3 mL (3 mLs Nebulization Given 09/29/18 1259)  methylPREDNISolone sodium succinate (SOLU-MEDROL) 125 mg/2 mL injection 125 mg (125 mg Intravenous Given 09/29/18 1326)  albuterol (PROVENTIL HFA;VENTOLIN HFA) 108 (90 Base) MCG/ACT inhaler 2 puff (2 puffs Inhalation Given 09/29/18 1332)     Initial Impression / Assessment and Plan / ED Course  I have reviewed the triage vital signs and the nursing notes.  Pertinent labs & imaging results that were available during my care of the patient were reviewed by me and considered in my medical decision  making (see chart for details).  Clinical Course as of Sep 30 904  Wed Sep 29, 2018  1225 Reviewed patient's prior imaging and in September 9/23 she had a right-sided pneumothorax that needed a pigtail catheter.   [MB]  80 Patient's chest x-ray shows COPD but no pneumothorax.  Her lab work so far is unremarkable but BNP is still pending.  She had some improvement with her DuoNeb.  We will start her on some steroids and likely if she trends well can be discharged with an inhaler.   [MB]  7342 Patient ambulated in the department pulse ox and stated 100%.  Her heart rate was around 102.  She said she felt while walking.  Anticipate discharge her on a burst of steroids and an inhaler with close follow-up with her PCP.   [MB]    Clinical Course User Index [MB] Hayden Rasmussen, MD     Final Clinical Impressions(s) / ED Diagnoses   Final diagnoses:  COPD exacerbation [facility])    ED Discharge Orders         Ordered    albuterol (PROVENTIL HFA;VENTOLIN HFA) 108 (90 Base) MCG/ACT inhaler  Every 6 hours PRN     09/29/18 1439    predniSONE (DELTASONE) 20 MG tablet  Daily     09/29/18 1439    mometasone-formoterol (DULERA) 100-5 MCG/ACT AERO  2 times daily     09/29/18 1519           Hayden Rasmussen, MD 09/30/18 [id number]

## 2019-07-31 DIAGNOSIS — U071 COVID-19: Secondary | ICD-10-CM

## 2019-07-31 HISTORY — DX: COVID-19: U07.1

## 2019-09-29 ENCOUNTER — Emergency Department (HOSPITAL_COMMUNITY): Payer: Medicare Other

## 2019-09-29 ENCOUNTER — Encounter (HOSPITAL_COMMUNITY): Payer: Self-pay

## 2019-09-29 ENCOUNTER — Inpatient Hospital Stay (HOSPITAL_COMMUNITY)
Admission: EM | Admit: 2019-09-29 | Discharge: 2019-10-19 | DRG: 163 | Disposition: A | Discharge status: Home Health | Payer: Medicare Other | Attending: Internal Medicine | Admitting: Internal Medicine

## 2019-09-29 DIAGNOSIS — J449 Chronic obstructive pulmonary disease, unspecified: Secondary | ICD-10-CM | POA: Diagnosis not present

## 2019-09-29 DIAGNOSIS — J439 Emphysema, unspecified: Secondary | ICD-10-CM | POA: Diagnosis present

## 2019-09-29 DIAGNOSIS — F0390 Unspecified dementia without behavioral disturbance: Secondary | ICD-10-CM | POA: Diagnosis present

## 2019-09-29 DIAGNOSIS — J9382 Other air leak: Secondary | ICD-10-CM | POA: Diagnosis not present

## 2019-09-29 DIAGNOSIS — E875 Hyperkalemia: Secondary | ICD-10-CM | POA: Diagnosis not present

## 2019-09-29 DIAGNOSIS — M1990 Unspecified osteoarthritis, unspecified site: Secondary | ICD-10-CM | POA: Diagnosis present

## 2019-09-29 DIAGNOSIS — R0602 Shortness of breath: Secondary | ICD-10-CM | POA: Diagnosis present

## 2019-09-29 DIAGNOSIS — Z885 Allergy status to narcotic agent status: Secondary | ICD-10-CM | POA: Diagnosis not present

## 2019-09-29 DIAGNOSIS — Z681 Body mass index (BMI) 19 or less, adult: Secondary | ICD-10-CM

## 2019-09-29 DIAGNOSIS — E876 Hypokalemia: Secondary | ICD-10-CM | POA: Diagnosis present

## 2019-09-29 DIAGNOSIS — Z880 Allergy status to penicillin: Secondary | ICD-10-CM | POA: Diagnosis not present

## 2019-09-29 DIAGNOSIS — F05 Delirium due to known physiological condition: Secondary | ICD-10-CM | POA: Diagnosis not present

## 2019-09-29 DIAGNOSIS — D638 Anemia in other chronic diseases classified elsewhere: Secondary | ICD-10-CM | POA: Diagnosis present

## 2019-09-29 DIAGNOSIS — J9383 Other pneumothorax: Principal | ICD-10-CM | POA: Diagnosis present

## 2019-09-29 DIAGNOSIS — Z7951 Long term (current) use of inhaled steroids: Secondary | ICD-10-CM | POA: Diagnosis not present

## 2019-09-29 DIAGNOSIS — Z882 Allergy status to sulfonamides status: Secondary | ICD-10-CM | POA: Diagnosis not present

## 2019-09-29 DIAGNOSIS — Z8551 Personal history of malignant neoplasm of bladder: Secondary | ICD-10-CM

## 2019-09-29 DIAGNOSIS — F17210 Nicotine dependence, cigarettes, uncomplicated: Secondary | ICD-10-CM | POA: Diagnosis present

## 2019-09-29 DIAGNOSIS — Z9889 Other specified postprocedural states: Secondary | ICD-10-CM

## 2019-09-29 DIAGNOSIS — Z888 Allergy status to other drugs, medicaments and biological substances status: Secondary | ICD-10-CM

## 2019-09-29 DIAGNOSIS — J9311 Primary spontaneous pneumothorax: Secondary | ICD-10-CM | POA: Diagnosis not present

## 2019-09-29 DIAGNOSIS — J9611 Chronic respiratory failure with hypoxia: Secondary | ICD-10-CM | POA: Diagnosis present

## 2019-09-29 DIAGNOSIS — Z9689 Presence of other specified functional implants: Secondary | ICD-10-CM

## 2019-09-29 DIAGNOSIS — E43 Unspecified severe protein-calorie malnutrition: Secondary | ICD-10-CM | POA: Diagnosis not present

## 2019-09-29 DIAGNOSIS — Z881 Allergy status to other antibiotic agents status: Secondary | ICD-10-CM

## 2019-09-29 DIAGNOSIS — Z886 Allergy status to analgesic agent status: Secondary | ICD-10-CM | POA: Diagnosis not present

## 2019-09-29 DIAGNOSIS — J431 Panlobular emphysema: Secondary | ICD-10-CM | POA: Diagnosis not present

## 2019-09-29 DIAGNOSIS — Z8616 Personal history of COVID-19: Secondary | ICD-10-CM

## 2019-09-29 DIAGNOSIS — J939 Pneumothorax, unspecified: Secondary | ICD-10-CM

## 2019-09-29 HISTORY — DX: Malignant neoplasm of bladder, unspecified: C67.9

## 2019-09-29 LAB — CBC WITH DIFFERENTIAL/PLATELET
Abs Immature Granulocytes: 0.19 10*3/uL — ABNORMAL HIGH (ref 0.00–0.07)
Basophils Absolute: 0.1 10*3/uL (ref 0.0–0.1)
Basophils Relative: 1 %
Eosinophils Absolute: 0 10*3/uL (ref 0.0–0.5)
Eosinophils Relative: 0 %
HCT: 34.2 % — ABNORMAL LOW (ref 36.0–46.0)
Hemoglobin: 11 g/dL — ABNORMAL LOW (ref 12.0–15.0)
Immature Granulocytes: 2 %
Lymphocytes Relative: 6 %
Lymphs Abs: 0.7 10*3/uL (ref 0.7–4.0)
MCH: 30.7 pg (ref 26.0–34.0)
MCHC: 32.2 g/dL (ref 30.0–36.0)
MCV: 95.5 fL (ref 80.0–100.0)
Monocytes Absolute: 0.8 10*3/uL (ref 0.1–1.0)
Monocytes Relative: 7 %
Neutro Abs: 9.3 10*3/uL — ABNORMAL HIGH (ref 1.7–7.7)
Neutrophils Relative %: 84 %
Platelets: 390 10*3/uL (ref 150–400)
RBC: 3.58 MIL/uL — ABNORMAL LOW (ref 3.87–5.11)
RDW: 14 % (ref 11.5–15.5)
WBC: 11.1 10*3/uL — ABNORMAL HIGH (ref 4.0–10.5)
nRBC: 0 % (ref 0.0–0.2)

## 2019-09-29 LAB — BASIC METABOLIC PANEL
Anion gap: 14 (ref 5–15)
BUN: 20 mg/dL (ref 8–23)
CO2: 26 mmol/L (ref 22–32)
Calcium: 9 mg/dL (ref 8.9–10.3)
Chloride: 98 mmol/L (ref 98–111)
Creatinine, Ser: 0.65 mg/dL (ref 0.44–1.00)
GFR calc Af Amer: >60 mL/min (ref >60)
GFR calc non Af Amer: >60 mL/min (ref >60)
Glucose, Bld: 124 mg/dL — ABNORMAL HIGH (ref 70–99)
Potassium: 3.1 mmol/L — ABNORMAL LOW (ref 3.5–5.1)
Sodium: 138 mmol/L (ref 135–145)

## 2019-09-29 LAB — RESPIRATORY PANEL BY RT PCR (FLU A&B, COVID)
Influenza A by PCR: NEGATIVE
Influenza B by PCR: NEGATIVE
SARS Coronavirus 2 by RT PCR: NEGATIVE

## 2019-09-29 LAB — POC SARS CORONAVIRUS 2 AG -  ED: SARS Coronavirus 2 Ag: NEGATIVE

## 2019-09-29 MED ORDER — DOCUSATE SODIUM 100 MG PO CAPS
100.0000 mg | ORAL_CAPSULE | Freq: Two times a day (BID) | ORAL | Status: DC
  Administered 2019-09-29 – 2019-10-19 (×34): 100 mg via ORAL
  Filled 2019-09-29 (×36): qty 1

## 2019-09-29 MED ORDER — SODIUM CHLORIDE 0.9% FLUSH: 3.0000 mL | INTRAVENOUS | Status: DC | PRN

## 2019-09-29 MED ORDER — MIDAZOLAM HCL 2 MG/2ML IJ SOLN
2.0000 mg | Freq: Once | INTRAMUSCULAR | Status: AC
  Administered 2019-09-29: 2 mg via INTRAVENOUS
  Filled 2019-09-29: qty 2

## 2019-09-29 MED ORDER — ENOXAPARIN SODIUM 40 MG/0.4ML ~~LOC~~ SOLN
40.0000 mg | SUBCUTANEOUS | Status: DC
  Administered 2019-09-29 – 2019-10-18 (×20): 40 mg via SUBCUTANEOUS
  Filled 2019-09-29 (×20): qty 0.4

## 2019-09-29 MED ORDER — LIDOCAINE-EPINEPHRINE (PF) 2 %-1:200000 IJ SOLN
20.0000 mL | Freq: Once | INTRAMUSCULAR | Status: DC
  Filled 2019-09-29: qty 20

## 2019-09-29 MED ORDER — ACETAMINOPHEN 500 MG PO TABS
500.0000 mg | ORAL_TABLET | Freq: Every day | ORAL | Status: DC | PRN
  Administered 2019-10-02 – 2019-10-06 (×2): 500 mg via ORAL
  Administered 2019-10-07 – 2019-10-12 (×2): 1000 mg via ORAL
  Administered 2019-10-13 – 2019-10-15 (×2): 500 mg via ORAL
  Filled 2019-09-29: qty 2
  Filled 2019-09-29 (×2): qty 1
  Filled 2019-09-29: qty 2
  Filled 2019-09-29 (×2): qty 1
  Filled 2019-09-29: qty 2

## 2019-09-29 MED ORDER — ALBUTEROL SULFATE (2.5 MG/3ML) 0.083% IN NEBU: 3.0000 mL | INHALATION_SOLUTION | Freq: Four times a day (QID) | RESPIRATORY_TRACT | Status: DC | PRN

## 2019-09-29 MED ORDER — SODIUM CHLORIDE 0.9% FLUSH
3.0000 mL | Freq: Two times a day (BID) | INTRAVENOUS | Status: DC
  Administered 2019-09-29 – 2019-10-19 (×35): 3 mL via INTRAVENOUS

## 2019-09-29 MED ORDER — FENTANYL CITRATE (PF) 100 MCG/2ML IJ SOLN
50.0000 ug | Freq: Once | INTRAMUSCULAR | Status: AC
  Administered 2019-09-29: 50 ug via INTRAVENOUS
  Filled 2019-09-29: qty 2

## 2019-09-29 MED ORDER — MOMETASONE FURO-FORMOTEROL FUM 100-5 MCG/ACT IN AERO
2.0000 | INHALATION_SPRAY | Freq: Two times a day (BID) | RESPIRATORY_TRACT | Status: DC
  Administered 2019-09-29 – 2019-10-19 (×38): 2 via RESPIRATORY_TRACT
  Filled 2019-09-29 (×2): qty 8.8

## 2019-09-29 MED ORDER — OXYCODONE-ACETAMINOPHEN 5-325 MG PO TABS
1.0000 | ORAL_TABLET | Freq: Three times a day (TID) | ORAL | Status: DC
  Administered 2019-09-29 – 2019-10-06 (×20): 1 via ORAL
  Filled 2019-09-29 (×21): qty 1

## 2019-09-29 MED ORDER — SODIUM CHLORIDE 0.9 % IV SOLN: 250.0000 mL | INTRAVENOUS | Status: DC | PRN

## 2019-09-29 MED ORDER — ALPRAZOLAM 0.5 MG PO TABS
0.5000 mg | ORAL_TABLET | Freq: Every day | ORAL | Status: DC
  Administered 2019-09-30 – 2019-10-13 (×13): 0.5 mg via ORAL
  Filled 2019-09-29 (×15): qty 1

## 2019-09-29 NOTE — H&P (Signed)
History and Physical    Amanda [name] [id number] DOB: 1941-03-12 DOA: 09/29/2019  PCP: Lemmie Evens, MD (Confirm with patient/family/NH records and if not entered, this has to be entered at [facility] point of entry) Patient coming from: [place] is coming from home   I have personally briefly reviewed patient's old medical records in [place]  Chief Complaint: Increased shortness of breath of abrupt onset after coughing spell  HPI: Amanda [name] is a 78 y.o. female with medical history significant of  known history of multiple spontaneous pneumothorax on the right, she has had a prior history of COPD as well as a history of bladder cancer.  She presents to the hospital with shortness of breath which became much worse today, she has been somewhat short of breath since being diagnosed with coronavirus 1 month ago. She had an admission to an outside hospital 1 month ago and had a spontaneous pneumothorax that was treated with a chest tube at that time.  She cannot recall the exact piece of hardware that was inside however she successfully was discharged from the hospital but comes back today with increasing shortness of breath.  She has no chest pain, no fever, occasional cough.  She reports that her baseline COPD is quite severe and she uses daily inhalational therapy.  She has not smoke cigarettes in the last 3 years. She denies have a pulmonologist. She has seen Dr. Roxan Hockey previously for PTX  (For level 3, the HPI must include 4+ descriptors: Location, Quality, Severity, Duration, Timing, Context, modifying factors, associated signs/symptoms and/or status of 3+ chronic problems.)  (Please avoid self-populating past medical history here) (The initial 2-3 lines should be focused and good to copy and paste in the HPI section of the daily progress note).  ED Course: Patient hemodynamically stable. CXR revealed recurrent right spontaneous PTX. ED-MD place a pigtail catheter on the right  with prompt re-inflation of the right lung. Dr. Roxan Hockey was consulted and requests that patient be transferred to [facility] for further care. [facility] called to admit to medical service.   Review of Systems: As per HPI otherwise 10 point review of systems negative.  Unacceptable ROS statements: "10 systems reviewed," "Extensive" (without elaboration).  Acceptable ROS statements: "All others negative," "All others reviewed and are negative," and "All others unremarkable," with at [place] documented Can't double dip - if using for HPI can't use for ROS  Past Medical History:  Diagnosis Date  . Arthritis   . Blood transfusion without reported diagnosis   . Cancer ([place])    bladder  . COPD (chronic obstructive pulmonary disease) ([place])   . Renal disorder    kidney stones  . Seizures ([place])   . Spontaneous pneumothorax 05/2018   right    Past Surgical History:  Procedure Laterality Date  . ABDOMINAL HYSTERECTOMY    . REVISION UROSTOMY CUTANEOUS     Social Hx - married > 50 yrs, widowed. She has 1 daughter (contact person), 2 sons, 4 grand-daughters. One of her sons died at age 67 in the past month from Covid 39 PNA. 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    reports that she has been smoking cigarettes. She has never used smokeless tobacco. She reports that she does not drink alcohol or use drugs.  Allergies  Allergen Reactions  . Ciprofloxacin Other (See Comments)    GI pain, headache. Pt states she had while she was in hospital (  IV) and was ok   . Lactose Other (See Comments)    Intolerant per patient Intolerant per patient   . Levofloxacin   . Sulfa Antibiotics   . Sulfamethoxazole Other (See Comments)    Other reaction(s): Dizziness (intolerance)  . Aspirin Nausea And Vomiting  . Cephalexin Rash  . Clindamycin Rash  . Erythromycin Base Rash  . Hydromorphone Other (See Comments) and Rash    Rash on back, redness Rash on back,  redness   . Penicillins Rash    No family history on file. Unacceptable: Noncontributory, unremarkable, or negative. Acceptable: Family history reviewed and not pertinent (If you reviewed it)  Prior to Admission medications   Medication Sig Start Date End Date Taking? Authorizing Provider  acetaminophen (TYLENOL) 500 MG tablet Take 500-1,000 mg by mouth daily as needed for mild pain or moderate pain.    Yes [provider]  albuterol (PROVENTIL HFA;VENTOLIN HFA) 108 (90 Base) MCG/ACT inhaler Inhale 1-2 puffs into the lungs every 6 (six) hours as needed for wheezing or shortness of breath. 09/29/18  Yes Hayden Rasmussen, MD  ALPRAZolam Duanne Moron) 0.5 MG tablet Take 0.5 mg by mouth daily.    Yes [provider]  Cholecalciferol (VITAMIN D-1000 MAX ST) 1000 units tablet Take 1,000 Units by mouth daily.    Yes [provider]  Multiple Vitamin (MULTIVITAMIN) capsule Take 1 capsule by mouth daily.    Yes [provider]  oxyCODONE-acetaminophen (PERCOCET/ROXICET) 5-325 MG tablet Take 1 tablet by mouth 3 (three) times daily. 09/05/19  Yes [provider]  SYMBICORT 80-4.5 MCG/ACT inhaler Inhale 2 puffs into the lungs 2 (two) times daily. 09/05/19  Yes [provider]  vitamin C (ASCORBIC ACID) 250 MG tablet Take 500 mg by mouth daily.    Yes [provider]    Physical Exam: Vitals:   09/29/19 1630 09/29/19 1645 09/29/19 1700 09/29/19 1715  BP: (!) 101/54 (!) 101/54 (!) 98/51 (!) 97/52  Pulse: (!) 107 (!) 105 (!) 101 98  Resp: (!) 30 (!) 31 (!) 30 (!) 27  Temp:      TempSrc:      SpO2: 92% 92% 95% 96%  Weight:      Height:        Constitutional: NAD, calm, comfortable Vitals:   09/29/19 1630 09/29/19 1645 09/29/19 1700 09/29/19 1715  BP: (!) 101/54 (!) 101/54 (!) 98/51 (!) 97/52  Pulse: (!) 107 (!) 105 (!) 101 98  Resp: (!) 30 (!) 31 (!) 30 (!) 27  Temp:      TempSrc:      SpO2: 92% 92% 95% 96%  Weight:      Height:        General appearance:  - cachectic with temporal wasting. In no acute distress. Eyes: PERRL, lids and conjunctivae normal ENMT: Mucous membranes are moist. Posterior pharynx clear of any exudate or lesions.Edentulous Neck: normal, supple, no masses, no thyromegaly Respiratory: clear to auscultation bilaterally, no wheezing, no crackles. Increased  respiratory effort with neck retractions w/o abdominal breathing.  Cardiovascular: Thready pulse. Regular rate and rhythm, no murmurs / rubs / gallops. No extremity edema. 1+ pedal pulses. No carotid bruits.  Abdomen: no tenderness, no masses palpated. No hepatosplenomegaly. Bowel sounds positive.  Musculoskeletal: no clubbing / cyanosis. No joint deformity upper and lower extremities. Good ROM, no contractures. Decreased muscle tone.  Skin: no rashes, lesions, ulcers. No induration Neurologic: CN 2-12 grossly intact.   Psychiatric: Normal judgment and insight. Alert and  oriented x 3. Normal mood.   (Anything < 9 systems with 2 bullets each down codes to level 1) (If patient refuses exam can't bill higher level) (Make sure to document decubitus ulcers present on admission -- if possible -- and whether patient has chronic indwelling catheter at time of admission)  Labs on Admission: I have personally reviewed following labs and imaging studies  CBC: Recent Labs  Lab 09/29/19 1546  WBC 11.1*  NEUTROABS 9.3*  HGB 11.0*  HCT 34.2*  MCV 95.5  PLT [id number]   Basic Metabolic Panel: Recent Labs  Lab 09/29/19 1546  NA 138  K 3.1*  CL 98  CO2 26  GLUCOSE 124*  BUN 20  CREATININE 0.65  CALCIUM 9.0   GFR: Estimated Creatinine Clearance: 43.6 mL/min (by C-G formula based on SCr of 0.65 mg/dL). Liver Function Tests: No results for input(s): AST, ALT, ALKPHOS, BILITOT, PROT, ALBUMIN in the last 168 hours. No results for input(s): LIPASE, AMYLASE in the last 168 hours. No results for input(s): AMMONIA in the last 168 hours. Coagulation  Profile: No results for input(s): INR, PROTIME in the last 168 hours. Cardiac Enzymes: No results for input(s): CKTOTAL, CKMB, CKMBINDEX, TROPONINI in the last 168 hours. BNP (last 3 results) No results for input(s): PROBNP in the last 8760 hours. HbA1C: No results for input(s): HGBA1C in the last 72 hours. CBG: No results for input(s): GLUCAP in the last 168 hours. Lipid Profile: No results for input(s): CHOL, HDL, LDLCALC, TRIG, CHOLHDL, LDLDIRECT in the last 72 hours. Thyroid Function Tests: No results for input(s): TSH, T4TOTAL, FREET4, T3FREE, THYROIDAB in the last 72 hours. Anemia Panel: No results for input(s): VITAMINB12, FOLATE, FERRITIN, TIBC, IRON, RETICCTPCT in the last 72 hours. Urine analysis:    Component Value Date/Time   COLORURINE YELLOW 02/11/2011 1846   APPEARANCEUR HAZY (A) 02/11/2011 1846   LABSPEC 1.015 02/11/2011 1846   PHURINE 7.0 02/11/2011 1846   GLUCOSEU NEGATIVE 02/11/2011 1846   HGBUR SMALL (A) 02/11/2011 1846   BILIRUBINUR NEGATIVE 02/11/2011 1846   KETONESUR NEGATIVE 02/11/2011 1846   PROTEINUR NEGATIVE 02/11/2011 1846   UROBILINOGEN 0.2 02/11/2011 1846   NITRITE NEGATIVE 02/11/2011 1846   LEUKOCYTESUR MODERATE (A) 02/11/2011 1846    Radiological Exams on Admission: DG Chest Portable 1 View  Result Date: 09/29/2019 CLINICAL DATA:  COPD. Tube placement. Recent COVID-19 with collapse lung. EXAM: PORTABLE CHEST 1 VIEW COMPARISON:  Earlier today at 2:20 p.m. FINDINGS: 3:55 p.m. Placement of a right-sided chest tube. Midline trachea. Normal heart size. Atherosclerosis in the transverse aorta. Numerous leads and wires project over the chest. Probable trace right pleural fluid. No residual pneumothorax. Diffuse interstitial thickening. Similar bibasilar airspace disease. Left apical pleuroparenchymal scarring. IMPRESSION: Re-expansion of the right lung after chest tube placement. Diffuse interstitial thickening with similar bibasilar airspace disease,  most likely atelectasis. Aortic Atherosclerosis (ICD10-I70.0). Electronically Signed   By: Abigail Miyamoto M.D.   On: 09/29/2019 16:07   DG Chest Portable 1 View  Result Date: 09/29/2019 CLINICAL DATA:  History of COVID on Thanksgiving. COPD. Wheezing in the right lung. EXAM: PORTABLE CHEST 1 VIEW COMPARISON:  Chest radiograph 08/30/2019 FINDINGS: Stable cardiomediastinal contours. There is a large right pneumothorax with compressive atelectasis of the right lung. Diffuse bilateral coarse interstitial markings. Lungs are hyperinflated. Biapical pleuroparenchymal scarring. No acute findings in the visualized skeleton. IMPRESSION: 1. Large right pneumothorax with compressive atelectasis of the right lung. Probable fracture in a right lower anterolateral rib. 2. Chronic changes consistent with  COPD and chronic bronchitis. These results were called by telephone at the time of interpretation on 09/29/2019 at 2:44 pm to provider Fredia Sorrow , who verbally acknowledged these results. Electronically Signed   By: Audie Pinto M.D.   On: 09/29/2019 14:47    EKG: Independently reviewed. Sinus tach, RVH - no change from previous  Assessment/Plan Active Problems:   Spontaneous pneumothorax   Emphysema/COPD (HCC)   Dementia (HCC)   Recurrent spontaneous pneumothorax  (please populate well all problems here in Problem List. (For example, if patient is on BP meds at home and you resume or decide to hold them, it is a problem that needs to be her. Same for CAD, COPD, HLD and so on)   1. Spontaneous PTX - she has had multiple episodes. Per Dr. Leonarda Salon office note she was not a surgical candidate. She did well with CT placement with re-expansion right lung. Plan Transfer to [facility] step-down  Continue low suction CT  Consult with Dr. Roxan Hockey - he is aware of her admission. Call in AM  2. COPD - advanced disease. Plan Continue home inhalational therapy  3. Covid - patient did have Covid  19 infection but made a recovery. She has been asymptomatic. Covid Ag test negative Plan PCR Covid test pending  Examiner PPE - with recent hx and PCR pending full PPE donned: double gloved, gowned, N-95 with cover surgical mask, eye protection. Proper doffing after exam.  DVT prophylaxis: lovenox (Lovenox/Heparin/SCD's/anticoagulated/None (if comfort care) Code Status: partial Code - no intubation (Full/Partial (specify details) Family Communication: spoke with daughter Elta [place], confirmed DNI status (Specify name, relationship. Do not write "discussed with patient". Specify tel # if discussed over the phone) Disposition Plan: home when medically stable (specify when and where you expect patient to be discharged) Consults called: Dr. Roxan Hockey - thoracic surgery (with names) Admission status: inpatient - stepdown (inpatient / obs / tele / medical floor / SDU)   Adella Hare MD [facility] Pager [phone]  If 7PM-7AM, please contact night-coverage [email or web address] Password [facility]  09/29/2019, 5:33 PM

## 2019-09-29 NOTE — Sedation Documentation (Signed)
Time Out performed at 1511.

## 2019-09-29 NOTE — ED Triage Notes (Signed)
EMS reports pt had covid on Thanksgiving.  Reports had a collapsed lung and was treated [facility].  Pt has copd and is on o2 at 3liters at home.  EMS says o2 sat was 85% on 3 liters, increased to 4 liters and sat increased to 91%.  Pt told ems she was't really sob at this time.  Pt was nauseated earlier and she took phenergan and used albuterol inhaler prior to ems arrival.  EMS reports wheezing in r lung.

## 2019-09-29 NOTE — Sedation Documentation (Signed)
Dr. Sabra Heck placing 59f Richardo Priest chest tube

## 2019-09-29 NOTE — Sedation Documentation (Signed)
Dr. Sabra Heck preparing site

## 2019-09-29 NOTE — ED Provider Notes (Signed)
[facility] EMERGENCY DEPARTMENT Provider Note   CSN: [id number] Arrival date & time: 09/29/19  1352     History Chief Complaint  Patient presents with  . Shortness of Breath    Amanda [name] is a 78 y.o. female.  HPI   This patient is a 78 year old female, she has a known history of multiple spontaneous pneumothorax on the right, she has had a prior history of COPD as well as a history of bladder cancer.  She presents to the hospital with shortness of breath which became much worse today, she has been somewhat short of breath since being diagnosed with coronavirus 1 month ago, she had an admission to an outside hospital 1 month ago and had a spontaneous pneumothorax that was treated with a chest tube at that time.  She cannot recall the exact piece of hardware that was inside however she successfully was discharged from the hospital but comes back today with increasing shortness of breath.  She has no chest pain, no fever, occasional cough.  She reports that her baseline COPD is quite severe and she uses daily albuterol.  She does not smoke cigarettes in the last 3 years.  Past Medical History:  Diagnosis Date  . Arthritis   . Blood transfusion without reported diagnosis   . Cancer ([place])    bladder  . COPD (chronic obstructive pulmonary disease) ([place])   . Renal disorder    kidney stones  . Seizures ([place])   . Spontaneous pneumothorax 05/2018   right    Patient Active Problem List   Diagnosis Date Noted  . Recurrent spontaneous pneumothorax 09/29/2019  . Emphysema/COPD ([place]) 07/06/2018  . Protein-calorie malnutrition, severe 06/25/2018  . Dementia ([place]) 06/22/2018  . SOB (shortness of breath) 06/21/2018  . Spontaneous pneumothorax 06/21/2018    Past Surgical History:  Procedure Laterality Date  . ABDOMINAL HYSTERECTOMY    . REVISION UROSTOMY CUTANEOUS       OB History   No obstetric history on file.     No family history on file.  Social History   Tobacco  Use  . Smoking status: Current Some Day Smoker    Types: Cigarettes  . Smokeless tobacco: Never Used  Substance Use Topics  . Alcohol use: Never  . Drug use: Never    Home Medications Prior to Admission medications   Medication Sig Start Date End Date Taking? Authorizing Provider  acetaminophen (TYLENOL) 500 MG tablet Take 500-1,000 mg by mouth daily as needed for mild pain or moderate pain.    Yes [provider]  albuterol (PROVENTIL HFA;VENTOLIN HFA) 108 (90 Base) MCG/ACT inhaler Inhale 1-2 puffs into the lungs every 6 (six) hours as needed for wheezing or shortness of breath. 09/29/18  Yes Hayden Rasmussen, MD  ALPRAZolam Duanne Moron) 0.5 MG tablet Take 0.5 mg by mouth daily.    Yes [provider]  Cholecalciferol (VITAMIN D-1000 MAX ST) 1000 units tablet Take 1,000 Units by mouth daily.    Yes [provider]  Multiple Vitamin (MULTIVITAMIN) capsule Take 1 capsule by mouth daily.    Yes [provider]  oxyCODONE-acetaminophen (PERCOCET/ROXICET) 5-325 MG tablet Take 1 tablet by mouth 3 (three) times daily. 09/05/19  Yes [provider]  SYMBICORT 80-4.5 MCG/ACT inhaler Inhale 2 puffs into the lungs 2 (two) times daily. 09/05/19  Yes [provider]  vitamin C (ASCORBIC ACID) 250 MG tablet Take 500 mg by mouth daily.    Yes [provider]  Allergies    Ciprofloxacin, Lactose, Levofloxacin, Sulfa antibiotics, Sulfamethoxazole, Aspirin, Cephalexin, Clindamycin, Erythromycin base, Hydromorphone, and Penicillins  Review of Systems   Review of Systems  All other systems reviewed and are negative.   Physical Exam Updated Vital Signs BP (!) 101/54   Pulse (!) 105   Temp 98.2 F (36.8 C) (Oral)   Resp (!) 31   Ht 1.651 m (5\' 5" )   Wt 47.6 kg   SpO2 92%   BMI 17.47 kg/m   Physical Exam Vitals and nursing note reviewed.  Constitutional:      General: She is in acute distress.     Appearance: She is well-developed. She  is ill-appearing.  HENT:     Head: Normocephalic and atraumatic.     Mouth/Throat:     Pharynx: No oropharyngeal exudate.  Eyes:     General: No scleral icterus.       Right eye: No discharge.        Left eye: No discharge.     Conjunctiva/sclera: Conjunctivae normal.     Pupils: Pupils are equal, round, and reactive to light.  Neck:     Thyroid: No thyromegaly.     Vascular: No JVD.  Cardiovascular:     Rate and Rhythm: Regular rhythm. Tachycardia present.     Heart sounds: Normal heart sounds. No murmur. No friction rub. No gallop.   Pulmonary:     Effort: Respiratory distress present.     Breath sounds: Wheezing present. No rales.     Comments: Decreased lung sounds in the right hemithorax Abdominal:     General: Bowel sounds are normal. There is no distension.     Palpations: Abdomen is soft. There is no mass.     Tenderness: There is no abdominal tenderness.  Musculoskeletal:        General: No tenderness. Normal range of motion.     Cervical back: Normal range of motion and neck supple.  Lymphadenopathy:     Cervical: No cervical adenopathy.  Skin:    General: Skin is warm and dry.     Findings: No erythema or rash.  Neurological:     Mental Status: She is alert.     Coordination: Coordination normal.     Comments: The patient is able to speak albeit in short sentences moving all 4 extremities  Psychiatric:        Behavior: Behavior normal.     ED Results / Procedures / Treatments   Labs (all labs ordered are listed, but only abnormal results are displayed) Labs Reviewed  CBC WITH DIFFERENTIAL/PLATELET - Abnormal; Notable for the following components:      Result Value   WBC 11.1 (*)    RBC 3.58 (*)    Hemoglobin 11.0 (*)    HCT 34.2 (*)    Neutro Abs 9.3 (*)    Abs Immature Granulocytes 0.19 (*)    All other components within normal limits  BASIC METABOLIC PANEL - Abnormal; Notable for the following components:   Potassium 3.1 (*)    Glucose, Bld 124  (*)    All other components within normal limits  POC SARS CORONAVIRUS 2 AG -  ED    EKG EKG Interpretation  Date/Time:  Thursday September 29 2019 15:35:12 EST Ventricular Rate:  111 PR Interval:    QRS Duration: 77 QT Interval:  309 QTC Calculation: 420 R Axis:   80 Text Interpretation: Sinus tachycardia Right atrial enlargement Consider right ventricular hypertrophy Nonspecific T abnrm,  anterolateral leads since last tracing no significant change Confirmed by Noemi Chapel [phone]) on 09/29/2019 4:56:38 PM   Radiology DG Chest Portable 1 View  Result Date: 09/29/2019 CLINICAL DATA:  COPD. Tube placement. Recent COVID-19 with collapse lung. EXAM: PORTABLE CHEST 1 VIEW COMPARISON:  Earlier today at 2:20 p.m. FINDINGS: 3:55 p.m. Placement of a right-sided chest tube. Midline trachea. Normal heart size. Atherosclerosis in the transverse aorta. Numerous leads and wires project over the chest. Probable trace right pleural fluid. No residual pneumothorax. Diffuse interstitial thickening. Similar bibasilar airspace disease. Left apical pleuroparenchymal scarring. IMPRESSION: Re-expansion of the right lung after chest tube placement. Diffuse interstitial thickening with similar bibasilar airspace disease, most likely atelectasis. Aortic Atherosclerosis (ICD10-I70.0). Electronically Signed   By: Abigail Miyamoto M.D.   On: 09/29/2019 16:07   DG Chest Portable 1 View  Result Date: 09/29/2019 CLINICAL DATA:  History of COVID on Thanksgiving. COPD. Wheezing in the right lung. EXAM: PORTABLE CHEST 1 VIEW COMPARISON:  Chest radiograph 08/30/2019 FINDINGS: Stable cardiomediastinal contours. There is a large right pneumothorax with compressive atelectasis of the right lung. Diffuse bilateral coarse interstitial markings. Lungs are hyperinflated. Biapical pleuroparenchymal scarring. No acute findings in the visualized skeleton. IMPRESSION: 1. Large right pneumothorax with compressive atelectasis of the right  lung. Probable fracture in a right lower anterolateral rib. 2. Chronic changes consistent with COPD and chronic bronchitis. These results were called by telephone at the time of interpretation on 09/29/2019 at 2:44 pm to provider Fredia Sorrow , who verbally acknowledged these results. Electronically Signed   By: Audie Pinto M.D.   On: 09/29/2019 14:47    Procedures .Critical Care Performed by: Noemi Chapel, MD Authorized by: Noemi Chapel, MD   Critical care provider statement:    Critical care time (minutes):  35   Critical care time was exclusive of:  Separately billable procedures and treating other patients and teaching time   Critical care was necessary to treat or prevent imminent or life-threatening deterioration of the following conditions:  Respiratory failure (pneumothorax)   Critical care was time spent personally by me on the following activities:  Blood draw for specimens, development of treatment plan with patient or surrogate, discussions with consultants, evaluation of patient's response to treatment, examination of patient, obtaining history from patient or surrogate, ordering and performing treatments and interventions, ordering and review of laboratory studies, ordering and review of radiographic studies, pulse oximetry, re-evaluation of patient's condition and review of old charts Comments:       CHEST TUBE INSERTION  Date/Time: 09/29/2019 5:16 PM Performed by: Noemi Chapel, MD Authorized by: Noemi Chapel, MD   Consent:    Consent obtained:  Verbal and written   Consent given by:  Patient   Risks discussed:  Bleeding, nerve damage, incomplete drainage, infection and damage to surrounding structures   Alternatives discussed:  No treatment, delayed treatment and alternative treatment Pre-procedure details:    Skin preparation:  ChloraPrep   Preparation: Patient was prepped and draped in the usual sterile fashion   Anesthesia (see MAR for exact dosages):     Anesthesia method:  Local infiltration   Local anesthetic:  Lidocaine 1% w/o epi Procedure details:    Placement location:  R lateral   Tube size (Fr):  Minicatheter   Ultrasound guidance: no     Tension pneumothorax: no     Tube connected to:  Suction   Drainage characteristics:  Air only   Suture material:  2-0 silk   Dressing:  Petrolatum-impregnated gauze  and 4x4 sterile gauze Post-procedure details:    Post-insertion x-ray findings: tube in good position     Patient tolerance of procedure:  Tolerated well, no immediate complications Comments:         (including critical care time)  Medications Ordered in ED Medications  lidocaine-EPINEPHrine (XYLOCAINE W/EPI) 2 %-1:200000 (PF) injection 20 mL (has no administration in time range)  acetaminophen (TYLENOL) tablet 500-1,000 mg (has no administration in time range)  oxyCODONE-acetaminophen (PERCOCET/ROXICET) 5-325 MG per tablet 1 tablet (has no administration in time range)  ALPRAZolam (XANAX) tablet 0.5 mg (has no administration in time range)  albuterol (PROVENTIL) (2.5 MG/3ML) 0.083% nebulizer solution 3 mL (has no administration in time range)  mometasone-formoterol (DULERA) 100-5 MCG/ACT inhaler 2 puff (has no administration in time range)  enoxaparin (LOVENOX) injection 40 mg (has no administration in time range)  sodium chloride flush (NS) 0.9 % injection 3 mL (has no administration in time range)  sodium chloride flush (NS) 0.9 % injection 3 mL (has no administration in time range)  0.9 %  sodium chloride infusion (has no administration in time range)  docusate sodium (COLACE) capsule 100 mg (has no administration in time range)  fentaNYL (SUBLIMAZE) injection 50 mcg (50 mcg Intravenous Given 09/29/19 1511)  midazolam (VERSED) injection 2 mg (2 mg Intravenous Given 09/29/19 1514)    ED Course  I have reviewed the triage vital signs and the nursing notes.  Pertinent labs & imaging results that were available during my  care of the patient were reviewed by me and considered in my medical decision making (see chart for details).    MDM Rules/Calculators/A&P                       I have personally viewed the chest x-ray, this is an anterior posterior portable chest x-ray which shows a large right-sided pneumothorax.  The patient will need to have a chest tube or a pigtail catheter placed for treatment, she may need pleurodesis.  Will discuss with cardiothoracic surgery but the chest tube is of a matter of time importance given its size and her symptomatic nature.  She was saturating in the mid 70% range, she is now on high flow oxygen in the 94 to 95% range  This patient required immediate placement of a chest tube secondary to tachycardia hypoxia and respiratory distress.  She had a large pneumothorax which required immediate attention.  Please see the note for tube placement above.  I discussed the care with the cardiothoracic surgeon Dr. Roxy Manns who states he is willing to have his service be the consulting physicians.  Unlikely to benefit from pleurodesis given advanced age and severe COPD however the patient has had multiple recurrent pneumothoraces.  She will be transferred to [facility].  Discussion with hospitalist, Dr. Linda Hedges who has agreed to admit the patient and coordinate transfer to the cardiac center where cardiothoracic surgery is located.  The patient has stabilize significantly since placement of the tube currently with a heart rate of 105, she is normotensive and her respiratory rate is improved with oxygen that has improved as well on less oxygen.  Final Clinical Impression(s) / ED Diagnoses Final diagnoses:  Spontaneous pneumothorax  Chronic obstructive pulmonary disease, unspecified COPD type ([place])      Noemi Chapel, MD 09/29/19 1718

## 2019-09-29 NOTE — ED Notes (Signed)
[facility] received report, ETA 30 min

## 2019-09-30 ENCOUNTER — Other Ambulatory Visit: Payer: Self-pay

## 2019-09-30 ENCOUNTER — Inpatient Hospital Stay (HOSPITAL_COMMUNITY): Payer: Medicare Other

## 2019-09-30 ENCOUNTER — Encounter (HOSPITAL_COMMUNITY): Payer: Self-pay | Admitting: Internal Medicine

## 2019-09-30 DIAGNOSIS — J9383 Other pneumothorax: Principal | ICD-10-CM

## 2019-09-30 DIAGNOSIS — E43 Unspecified severe protein-calorie malnutrition: Secondary | ICD-10-CM

## 2019-09-30 DIAGNOSIS — J9311 Primary spontaneous pneumothorax: Secondary | ICD-10-CM

## 2019-09-30 DIAGNOSIS — J449 Chronic obstructive pulmonary disease, unspecified: Secondary | ICD-10-CM

## 2019-09-30 DIAGNOSIS — E876 Hypokalemia: Secondary | ICD-10-CM | POA: Diagnosis present

## 2019-09-30 DIAGNOSIS — J431 Panlobular emphysema: Secondary | ICD-10-CM

## 2019-09-30 LAB — MRSA PCR SCREENING: MRSA by PCR: NEGATIVE

## 2019-09-30 LAB — PREALBUMIN: Prealbumin: 11.4 mg/dL — ABNORMAL LOW (ref 18–38)

## 2019-09-30 MED ORDER — ORAL CARE MOUTH RINSE
15.0000 mL | Freq: Two times a day (BID) | OROMUCOSAL | Status: DC
  Administered 2019-09-30 – 2019-10-19 (×32): 15 mL via OROMUCOSAL

## 2019-09-30 MED ORDER — POTASSIUM CHLORIDE CRYS ER 20 MEQ PO TBCR
40.0000 meq | EXTENDED_RELEASE_TABLET | Freq: Every day | ORAL | Status: AC
  Administered 2019-09-30 – 2019-10-02 (×3): 40 meq via ORAL
  Filled 2019-09-30 (×3): qty 2

## 2019-09-30 NOTE — Progress Notes (Addendum)
PROGRESS NOTE    Amanda [name]  [id number] DOB: 06-02-1941 DOA: 09/29/2019 PCP: Lemmie Evens, MD    Brief Narrative:  79 yo ww w/ severe COPD and ,ild dementia admitted w/ 3rd or c4th spontaneous PTX in past year. Recently had Covid-19 09/10/19) and son just died from it. Had chest tube placed in R in ED 12/31 w/ improvement in dyspnea and PTX   Assessment & Plan:   Principal Problem:   Recurrent spontaneous pneumothorax Active Problems:   SOB (shortness of breath)   Spontaneous pneumothorax   Dementia (HCC)   Protein-calorie malnutrition, severe   Emphysema/COPD (H[facility])   Hypokalemia   RECURRENT SPONTANEOUS PNEUMOTHORAX  - chest tube in and improved but has air leak - management per CTS - daughter asked about pleurodesis - defer to CTS - not a surgical candidate  COPD/EMPHYSEMA  - stable  - on Symbicort and albuterol  DEMENTIA  -- seems mild  SEVERE PROTEIN CALORIE MALNUTRITION  - nutrition consult - prealbumin, LFT, PO4  HYPOKALEMIA  - replete - recheck - check Mg    DVT prophylaxis: Lovenox Code Status: Partial DO NOT INTUBATE Family Communication: daughter Elta [place] today Disposition Plan: (specify when and where you expect patient to be discharged). Include barriers to DC in this tab.   Consultants:   CT surgery   Procedures:   Chest tube - pig tail catheter ED 12/31 right  Antimicrobials:  none   Subjective: Feeling better today - resting ok in bed About to eat a late lunch  Objective: Vitals:   09/30/19 0350 09/30/19 0804 09/30/19 0806 09/30/19 1217  BP: 129/62 112/61  (!) 91/53  Pulse: (!) 110 (!) 109 (!) 110 91  Resp: 15 20 (!) 23 (!) 28  Temp: 98.4 F (36.9 C) 99.9 F (37.7 C) 98.2 F (36.8 C) 98 F (36.7 C)  TempSrc: Oral Axillary Oral Oral  SpO2: 90% 92% 92% 94%  Weight:      Height:        Intake/Output Summary (Last 24 hours) at 09/30/2019 1556 Last data filed at 09/30/2019 1000 Gross per 24 hour    Intake 240 ml  Output 72 ml  Net 168 ml   Filed Weights   09/29/19 1401 09/30/19 0005  Weight: 47.6 kg 47 kg    Examination:    General:  NAD but frail elderly ww, asthenic and  Eyes:   anicteric Lungs:  Diffusely decreased Breat sounds Chest tube right mid/asillary line Heart::  Distant S1S2 no rubs, murmurs or gallops Abdomen:  soft and nontender, BS+, ileo-ureterostomy RLQ Ext:   no edema,, MM wasting   Data Reviewed: I have personally reviewed following labs and imaging studies  CBC: Recent Labs  Lab 09/29/19 1546  WBC 11.1*  NEUTROABS 9.3*  HGB 11.0*  HCT 34.2*  MCV 95.5  PLT [id number]   Basic Metabolic Panel: Recent Labs  Lab 09/29/19 1546  NA 138  K 3.1*  CL 98  CO2 26  GLUCOSE 124*  BUN 20  CREATININE 0.65  CALCIUM 9.0   GFR: Estimated Creatinine Clearance: 43 mL/min (by C-G formula based on SCr of 0.65 mg/dL).  Recent Results (from the past 240 hour(s))  Respiratory Panel by RT PCR (Flu A&B, Covid) - Nasopharyngeal Swab     Status: None   Collection Time: 09/29/19  6:05 PM   Specimen: Nasopharyngeal Swab  Result Value Ref Range Status   SARS Coronavirus 2 by RT PCR NEGATIVE NEGATIVE Final  Comment: (NOTE) SARS-CoV-2 target nucleic acids are NOT DETECTED. The SARS-CoV-2 RNA is generally detectable in upper respiratoy specimens during the acute phase of infection. The lowest concentration of SARS-CoV-2 viral copies this assay can detect is 131 copies/mL. A negative result does not preclude SARS-Cov-2 infection and should not be used as the sole basis for treatment or other patient management decisions. A negative result may occur with  improper specimen collection/handling, submission of specimen other than nasopharyngeal swab, presence of viral mutation(s) within the areas targeted by this assay, and inadequate number of viral copies (<131 copies/mL). A negative result must be combined with clinical observations, patient history, and  epidemiological information. The expected result is Negative. Fact Sheet for Patients:  PinkCheek.be Fact Sheet for Healthcare Providers:  GravelBags.it This test is not yet ap proved or cleared by the [place] FDA and  has been authorized for detection and/or diagnosis of SARS-CoV-2 by FDA under an Emergency Use Authorization (EUA). This EUA will remain  in effect (meaning this test can be used) for the duration of the COVID-19 declaration under Section 564(b)(1) of the Act, 21 U.S.C. section 360bbb-3(b)(1), unless the authorization is terminated or revoked sooner.    Influenza A by PCR NEGATIVE NEGATIVE Final   Influenza B by PCR NEGATIVE NEGATIVE Final    Comment: (NOTE) The Xpert Xpress SARS-CoV-2/FLU/RSV assay is intended as an aid in  the diagnosis of influenza from Nasopharyngeal swab specimens and  should not be used as a sole basis for treatment. Nasal washings and  aspirates are unacceptable for Xpert Xpress SARS-CoV-2/FLU/RSV  testing. Fact Sheet for Patients: PinkCheek.be Fact Sheet for Healthcare Providers: GravelBags.it This test is not yet approved or cleared by the [place] FDA and  has been authorized for detection and/or diagnosis of SARS-CoV-2 by  FDA under an Emergency Use Authorization (EUA). This EUA will remain  in effect (meaning this test can be used) for the duration of the  Covid-19 declaration under Section 564(b)(1) of the Act, 21  U.S.C. section 360bbb-3(b)(1), unless the authorization is  terminated or revoked. Performed at [facility], [address], [place], [place] [address]   MRSA PCR Screening     Status: None   Collection Time: 09/30/19  1:04 AM   Specimen: Nasopharyngeal  Result Value Ref Range Status   MRSA by PCR NEGATIVE NEGATIVE Final    Comment:        The GeneXpert MRSA Assay (FDA approved for NASAL  specimens only), is one component of a comprehensive MRSA colonization surveillance program. It is not intended to diagnose MRSA infection nor to guide or monitor treatment for MRSA infections. Performed at [facility], [place] [address] [place], [place] [address]          Radiology Studies: Portable chest 1 View  Result Date: 09/30/2019 CLINICAL DATA:  Recurrent spontaneous pneumothorax with chest tube in place. EXAM: PORTABLE CHEST 1 VIEW COMPARISON:  09/29/2019 FINDINGS: Right chest tube in place. a small residual right-sided pneumothorax is identified. This appears increased in volume compared with 09/29/2019 at 3:55 p.m. Hyperinflation with chronic interstitial changes noted consistent with advanced COPD/emphysema. Diffuse interstitial thickening is noted bilaterally, unchanged. IMPRESSION: 1. Small residual right-sided pneumothorax. This appears increased in volume compared with 09/29/19 at 3:55 p.m. 2. Advanced changes of COPD/emphysema with superimposed increase interstitial markings bilaterally. Electronically Signed   By: Kerby Moors M.D.   On: 09/30/2019 09:44   DG Chest Portable 1 View  Result Date: 09/29/2019 CLINICAL DATA:  COPD. Tube placement. Recent COVID-19 with collapse lung. EXAM: PORTABLE CHEST 1 VIEW COMPARISON:  Earlier today at 2:20 p.m. FINDINGS: 3:55 p.m. Placement of a right-sided chest tube. Midline trachea. Normal heart size. Atherosclerosis in the transverse aorta. Numerous leads and wires project over the chest. Probable trace right pleural fluid. No residual pneumothorax. Diffuse interstitial thickening. Similar bibasilar airspace disease. Left apical pleuroparenchymal scarring. IMPRESSION: Re-expansion of the right lung after chest tube placement. Diffuse interstitial thickening with similar bibasilar airspace disease, most likely atelectasis. Aortic Atherosclerosis (ICD10-I70.0). Electronically Signed   By: Abigail Miyamoto M.D.   On: 09/29/2019 16:07    DG Chest Portable 1 View  Result Date: 09/29/2019 CLINICAL DATA:  History of COVID on Thanksgiving. COPD. Wheezing in the right lung. EXAM: PORTABLE CHEST 1 VIEW COMPARISON:  Chest radiograph 08/30/2019 FINDINGS: Stable cardiomediastinal contours. There is a large right pneumothorax with compressive atelectasis of the right lung. Diffuse bilateral coarse interstitial markings. Lungs are hyperinflated. Biapical pleuroparenchymal scarring. No acute findings in the visualized skeleton. IMPRESSION: 1. Large right pneumothorax with compressive atelectasis of the right lung. Probable fracture in a right lower anterolateral rib. 2. Chronic changes consistent with COPD and chronic bronchitis. These results were called by telephone at the time of interpretation on 09/29/2019 at 2:44 pm to provider Fredia Sorrow , who verbally acknowledged these results. Electronically Signed   By: Audie Pinto M.D.   On: 09/29/2019 14:47        Scheduled Meds: . ALPRAZolam  0.5 mg Oral Daily  . docusate sodium  100 mg Oral BID  . enoxaparin (LOVENOX) injection  40 mg Subcutaneous Q24H  . lidocaine-EPINEPHrine  20 mL Intradermal Once  . mouth rinse  15 mL Mouth Rinse BID  . mometasone-formoterol  2 puff Inhalation BID  . oxyCODONE-acetaminophen  1 tablet Oral TID  . sodium chloride flush  3 mL Intravenous Q12H   Continuous Infusions: . sodium chloride       LOS: 1 day      Silvano Rusk, MD [facility] Pager [phone]  If 7PM-7AM, please contact night-coverage [email or web address] Password [facility] 09/30/2019, 3:56 PM

## 2019-09-30 NOTE — Consult Note (Addendum)
[address]       [place] [address]             [phone]        Bambi B [facility] [place] Medical Record [id number] Date of Birth: [birth year]  Referring: No ref. provider found Primary Care: Lemmie Evens, MD Primary Cardiologist:No primary care provider on file.  Chief Complaint:    Chief Complaint  Patient presents with  . Shortness of Breath    History of Present Illness:     Ms. 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  She presented to the [facility] emergency department yesterday morning after having developed acute shortness of breath following a coughing episode at home.  Chest x-ray demonstrated a significant right-sided pneumothorax.  A 14 French pigtail catheter was placed and subsequent chest x-ray showed resolution of the pneumothorax.  She was transferred to [facility] for management of her recurrent pneumothorax  Patient was diagnosed as Covid positive about 1 month ago.  Her 22 year old son died in [place] within the past week from complications related to Covid infection.  At the time of admission, his Hands SARS PCR was negative and Covid antigen was negative.  Ms. Reeg was last seen in our office in October 2020 for follow-up of her previous spontaneous pneumothorax.  Dr. Roxan Hockey commented in his note that he had obtained a CT of the chest to assess for potential surgical intervention and noted severe central lobar and paraseptal emphysema.  He recommended against surgical intervention.  Past Medical History:  Diagnosis Date  . Arthritis   . Blood transfusion without reported diagnosis   . Cancer ([place])    bladder  . COPD (chronic obstructive pulmonary disease) ([place])   . Renal disorder    kidney stones  . Seizures  ([place])   . Spontaneous pneumothorax 05/2018   right    Past Surgical History:  Procedure Laterality Date  . ABDOMINAL HYSTERECTOMY    . REVISION UROSTOMY CUTANEOUS      Social History   Tobacco Use  Smoking Status Current Some Day Smoker  . Types: Cigarettes  Smokeless Tobacco Never Used    Social History   Substance and Sexual Activity  Alcohol Use Never     Allergies  Allergen Reactions  . Ciprofloxacin Other (See Comments)    GI pain, headache. Pt states she had while she was in hospital (IV) and was ok   . Lactose Other (See Comments)    Intolerant per patient Intolerant per patient   . Levofloxacin   . Sulfa Antibiotics   . Sulfamethoxazole Other (See Comments)    Other reaction(s): Dizziness (intolerance)  . Aspirin Nausea And Vomiting  . Cephalexin Rash  . Clindamycin Rash  . Erythromycin Base Rash  . Hydromorphone Other (See Comments) and Rash    Rash on back, redness Rash on back, redness   . Penicillins Rash    Current Facility-Administered Medications  Medication Dose Route Frequency Provider Last Rate Last Admin  . 0.9 %  sodium chloride infusion  250 mL Intravenous PRN Norins, Heinz Knuckles, MD      . acetaminophen (TYLENOL) tablet 500-1,000 mg  500-1,000 mg Oral Daily PRN Norins, Heinz Knuckles, MD      . albuterol (PROVENTIL) (2.5 MG/3ML)  0.083% nebulizer solution 3 mL  3 mL Inhalation Q6H PRN Norins, Heinz Knuckles, MD      . ALPRAZolam Duanne Moron) tablet 0.5 mg  0.5 mg Oral Daily Norins, Heinz Knuckles, MD   0.5 mg at 09/30/19 0913  . docusate sodium (COLACE) capsule 100 mg  100 mg Oral BID Neena Rhymes, MD   100 mg at 09/30/19 0913  . enoxaparin (LOVENOX) injection 40 mg  40 mg Subcutaneous Q24H Norins, Heinz Knuckles, MD   40 mg at 09/29/19 2049  . lidocaine-EPINEPHrine (XYLOCAINE W/EPI) 2 %-1:200000 (PF) injection 20 mL  20 mL Intradermal Once Noemi Chapel, MD      . MEDLINE mouth rinse  15 mL Mouth Rinse BID Norins, Heinz Knuckles, MD   15 mL at 09/30/19 0915  .  mometasone-formoterol (DULERA) 100-5 MCG/ACT inhaler 2 puff  2 puff Inhalation BID Norins, Heinz Knuckles, MD   2 puff at 09/29/19 2006  . oxyCODONE-acetaminophen (PERCOCET/ROXICET) 5-325 MG per tablet 1 tablet  1 tablet Oral TID Norins, Heinz Knuckles, MD   1 tablet at 09/30/19 0913  . sodium chloride flush (NS) 0.9 % injection 3 mL  3 mL Intravenous Q12H Norins, Heinz Knuckles, MD   3 mL at 09/30/19 0915  . sodium chloride flush (NS) 0.9 % injection 3 mL  3 mL Intravenous PRN Norins, Heinz Knuckles, MD        Medications Prior to Admission  Medication Sig Dispense Refill Last Dose  . acetaminophen (TYLENOL) 500 MG tablet Take 500-1,000 mg by mouth daily as needed for mild pain or moderate pain.    09/28/2019 at Unknown time  . albuterol (PROVENTIL HFA;VENTOLIN HFA) 108 (90 Base) MCG/ACT inhaler Inhale 1-2 puffs into the lungs every 6 (six) hours as needed for wheezing or shortness of breath. 1 Inhaler 0 09/29/2019 at Unknown time  . ALPRAZolam (XANAX) 0.5 MG tablet Take 0.5 mg by mouth daily.   5 09/29/2019 at Unknown time  . Cholecalciferol (VITAMIN D-1000 MAX ST) 1000 units tablet Take 1,000 Units by mouth daily.    09/29/2019 at Unknown time  . Multiple Vitamin (MULTIVITAMIN) capsule Take 1 capsule by mouth daily.    09/29/2019 at Unknown time  . oxyCODONE-acetaminophen (PERCOCET/ROXICET) 5-325 MG tablet Take 1 tablet by mouth 3 (three) times daily.   09/29/2019 at Unknown time  . SYMBICORT 80-4.5 MCG/ACT inhaler Inhale 2 puffs into the lungs 2 (two) times daily.   09/29/2019 at Unknown time  . vitamin C (ASCORBIC ACID) 250 MG tablet Take 500 mg by mouth daily.    09/29/2019 at Unknown time    Family history: Son, age 12 died from complications of Covid infection approximately 1 week prior to this admission.  Review of Systems:   ROS    Cardiac Review of Systems: Y or  [    ]= no  Chest Pain [    ]  Resting SOB [ y  ] Exertional SOB  [ y ]  Orthopnea [  ]   Pedal Edema [   ]    Palpitations [  ] Syncope   [  ]   Presyncope [   ]  General Review of Systems: [Y] = yes [  ]=no Constitional: recent weight change [  ]; anorexia [  ]; fatigue [  ]; nausea [  ]; night sweats [  ]; fever [  ]; or chills [  ]  Eye : blurred vision [  ]; diplopia [   ]; vision changes [  ];  Amaurosis fugax[  ]; Resp: cough [  ];  wheezing[  ];  hemoptysis[  ]; shortness of breath[y  ]; paroxysmal nocturnal dyspnea[  ]; dyspnea on exertion[  ]; or orthopnea[  ];  GI:  gallstones[  ], vomiting[  ];  dysphagia[  ]; melena[  ];  hematochezia [  ]; heartburn[  ];   Hx of  Colonoscopy[  ]; GU: kidney stones [  ]; hematuria[  ];   dysuria [  ];  nocturia[  ];  history of     obstruction [  ]; urinary frequency [  ]             Skin: rash, swelling[  ];, hair loss[  ];  peripheral edema[  ];  or itching[  ]; Musculosketetal: myalgias[  ];  joint swelling[  ];  joint erythema[  ];  joint pain[  ];  back pain[  ];  Heme/Lymph: bruising[  ];  bleeding[  ];  anemia[  ];  Neuro: TIA[  ];  headaches[  ];  stroke[  ];  vertigo[  ];  seizures[  ];   paresthesias[  ];  difficulty walking[  ];  Psych:depression[  ]; anxiety[  ];  Endocrine: diabetes[  ];  thyroid dysfunction[  ];                  Physical Exam: BP 112/61   Pulse (!) 110   Temp 98.2 F (36.8 C) (Oral)   Resp (!) 23   Ht 5\' 5"  (1.651 m)   Wt 47 kg   SpO2 92%   BMI 17.24 kg/m    General appearance: alert, cooperative, appears stated age and no distress Head: Normocephalic, without obvious abnormality, atraumatic Neck: no adenopathy, no carotid bruit, no JVD, supple, symmetrical, trachea midline and thyroid not enlarged, symmetric, no tenderness/mass/nodules Resp: Breath sounds clear, distant. The right pleural pigtail is connected to a Pleurevac at -20cmH2O suction. The insertion site is covered with a clean, bulky dressing.  Cardio: RRR, monitor shows NSR. GI: Soft, non-tender. There is a right  abdominal stoma covered with a urine collection bag Extremities: extremities normal, atraumatic, no cyanosis or edema Neurologic: No obvious focal deficits, she is alert and cooperative.     Recent Radiology Findings:   Portable chest 1 View  Result Date: 09/30/2019 CLINICAL DATA:  Recurrent spontaneous pneumothorax with chest tube in place. EXAM: PORTABLE CHEST 1 VIEW COMPARISON:  09/29/2019 FINDINGS: Right chest tube in place. a small residual right-sided pneumothorax is identified. This appears increased in volume compared with 09/29/2019 at 3:55 p.m. Hyperinflation with chronic interstitial changes noted consistent with advanced COPD/emphysema. Diffuse interstitial thickening is noted bilaterally, unchanged. IMPRESSION: 1. Small residual right-sided pneumothorax. This appears increased in volume compared with 09/29/19 at 3:55 p.m. 2. Advanced changes of COPD/emphysema with superimposed increase interstitial markings bilaterally. Electronically Signed   By: Kerby Moors M.D.   On: 09/30/2019 09:44   DG Chest Portable 1 View  Result Date: 09/29/2019 CLINICAL DATA:  COPD. Tube placement. Recent COVID-19 with collapse lung. EXAM: PORTABLE CHEST 1 VIEW COMPARISON:  Earlier today at 2:20 p.m. FINDINGS: 3:55 p.m. Placement of a right-sided chest tube. Midline trachea. Normal heart size. Atherosclerosis in the transverse aorta. Numerous leads and wires project over the chest. Probable trace right pleural fluid. No residual pneumothorax. Diffuse interstitial thickening. Similar bibasilar airspace disease. Left apical pleuroparenchymal  scarring. IMPRESSION: Re-expansion of the right lung after chest tube placement. Diffuse interstitial thickening with similar bibasilar airspace disease, most likely atelectasis. Aortic Atherosclerosis (ICD10-I70.0). Electronically Signed   By: Abigail Miyamoto M.D.   On: 09/29/2019 16:07   DG Chest Portable 1 View  Result Date: 09/29/2019 CLINICAL DATA:  History of COVID on  Thanksgiving. COPD. Wheezing in the right lung. EXAM: PORTABLE CHEST 1 VIEW COMPARISON:  Chest radiograph 08/30/2019 FINDINGS: Stable cardiomediastinal contours. There is a large right pneumothorax with compressive atelectasis of the right lung. Diffuse bilateral coarse interstitial markings. Lungs are hyperinflated. Biapical pleuroparenchymal scarring. No acute findings in the visualized skeleton. IMPRESSION: 1. Large right pneumothorax with compressive atelectasis of the right lung. Probable fracture in a right lower anterolateral rib. 2. Chronic changes consistent with COPD and chronic bronchitis. These results were called by telephone at the time of interpretation on 09/29/2019 at 2:44 pm to provider Fredia Sorrow , who verbally acknowledged these results. Electronically Signed   By: Audie Pinto M.D.   On: 09/29/2019 14:47     I have independently reviewed the above radiologic studies and discussed with the patient   Recent Lab Findings: Lab Results  Component Value Date   WBC 11.1 (H) 09/29/2019   HGB 11.0 (L) 09/29/2019   HCT 34.2 (L) 09/29/2019   PLT 390 09/29/2019   GLUCOSE 124 (H) 09/29/2019   ALT 12 06/21/2018   AST 21 06/21/2018   NA 138 09/29/2019   K 3.1 (L) 09/29/2019   CL 98 09/29/2019   CREATININE 0.65 09/29/2019   BUN 20 09/29/2019   CO2 26 09/29/2019   INR 1.07 06/21/2018      Assessment / Plan:      Ms. Leidel pleasant 79 year old female with the above described past medical history who is readmitted with recurrent right spontaneous pneumothorax.  She had a 14 French pigtail catheter placed in outside hospital yesterday.  There is a small residual right pneumothorax on chest x-ray this morning with an apical and lateral component.  There is an active air leak.  She has been evaluated by our service in the past and is not felt to be an appropriate candidate for surgical intervention due to her severe chronic obstructive pulmonary disease with intralobar and  paraseptal emphysema.  We will again attempt to manage her conservatively.  Chemical pleurodesis and/or endobronchial valve placement may be considerations in the future if the air leak does not resolve. We will continue to follow.    I  spent 20 minutes counseling the patient face to face.   Antony Odea, PA-C  09/30/2019 10:10 AM   I have seen and examined the patient and agree with the assessment and plan as outlined above by Enid Cutter, PA-C.  Patient is a frail 79 yo female with advanced COPD on home O2 therapy known to Dr Roxan Hockey from our service from previous hospitalizations for recurrent spontaneous pneumothorax who was recently hospitalized in [place] for another recurrent pneumothorax in the setting of COVID-19 pneumonia and has now been readmitted via [facility] with another recurrent spontaneous pneumothorax.  Her pneumothorax was successfully decompressed with chest tube placement in the ED and follow-up CXR reveals good tube position and excellent reexpansion of the right lung.  Patient is breathing comfortably but does have a significant air leak noted on exam.  In my opinion this patient should not be considered a candidate for open or video-assisted surgical intervention of any kind.  I would recommend continued chest tube drainage  to suction for now with hope of transition to water seal within a few days.  We will follow.   I spent in excess of 30 minutes during the conduct of this hospital encounter and >50% of this time involved direct face-to-face encounter with the patient for counseling and/or coordination of their care.    Rexene Alberts, MD 09/30/2019 12:24 PM

## 2019-10-01 ENCOUNTER — Inpatient Hospital Stay (HOSPITAL_COMMUNITY): Payer: Medicare Other

## 2019-10-01 DIAGNOSIS — R0602 Shortness of breath: Secondary | ICD-10-CM

## 2019-10-01 LAB — COMPREHENSIVE METABOLIC PANEL
ALT: 34 U/L (ref 0–44)
AST: 24 U/L (ref 15–41)
Albumin: 2.1 g/dL — ABNORMAL LOW (ref 3.5–5.0)
Alkaline Phosphatase: 60 U/L (ref 38–126)
Anion gap: 9 (ref 5–15)
BUN: 18 mg/dL (ref 8–23)
CO2: 26 mmol/L (ref 22–32)
Calcium: 8.9 mg/dL (ref 8.9–10.3)
Chloride: 100 mmol/L (ref 98–111)
Creatinine, Ser: 0.74 mg/dL (ref 0.44–1.00)
GFR calc Af Amer: >60 mL/min (ref >60)
GFR calc non Af Amer: >60 mL/min (ref >60)
Glucose, Bld: 94 mg/dL (ref 70–99)
Potassium: 4.3 mmol/L (ref 3.5–5.1)
Sodium: 135 mmol/L (ref 135–145)
Total Bilirubin: 0.6 mg/dL (ref 0.3–1.2)
Total Protein: 6.1 g/dL — ABNORMAL LOW (ref 6.5–8.1)

## 2019-10-01 LAB — VITAMIN B12: Vitamin B-12: 224 pg/mL (ref 180–914)

## 2019-10-01 LAB — CBC
HCT: 30.4 % — ABNORMAL LOW (ref 36.0–46.0)
Hemoglobin: 9.9 g/dL — ABNORMAL LOW (ref 12.0–15.0)
MCH: 30.6 pg (ref 26.0–34.0)
MCHC: 32.6 g/dL (ref 30.0–36.0)
MCV: 93.8 fL (ref 80.0–100.0)
Platelets: 393 10*3/uL (ref 150–400)
RBC: 3.24 MIL/uL — ABNORMAL LOW (ref 3.87–5.11)
RDW: 14.1 % (ref 11.5–15.5)
WBC: 10.9 10*3/uL — ABNORMAL HIGH (ref 4.0–10.5)
nRBC: 0 % (ref 0.0–0.2)

## 2019-10-01 LAB — MAGNESIUM: Magnesium: 1.9 mg/dL (ref 1.7–2.4)

## 2019-10-01 LAB — PHOSPHORUS: Phosphorus: 3.3 mg/dL (ref 2.5–4.6)

## 2019-10-01 MED ORDER — ADULT MULTIVITAMIN W/MINERALS CH
1.0000 | ORAL_TABLET | Freq: Every day | ORAL | Status: DC
  Administered 2019-10-01 – 2019-10-19 (×17): 1 via ORAL
  Filled 2019-10-01 (×18): qty 1

## 2019-10-01 MED ORDER — ENSURE ENLIVE PO LIQD
237.0000 mL | Freq: Three times a day (TID) | ORAL | Status: DC
  Administered 2019-10-01 – 2019-10-05 (×6): 237 mL via ORAL

## 2019-10-01 NOTE — Progress Notes (Signed)
Initial Nutrition Assessment  DOCUMENTATION CODES:   Underweight  INTERVENTION:   Ensure Enlive po TID, each supplement provides 350 kcal and 20 grams of protein.  MVI daily.  NUTRITION DIAGNOSIS:   Increased nutrient needs related to chronic illness(COPD, underweight) as evidenced by estimated needs.  GOAL:   Patient will meet greater than or equal to 90% of their needs  MONITOR:   PO intake, Supplement acceptance, Labs, Skin  REASON FOR ASSESSMENT:   Malnutrition Screening Tool, Consult Assessment of nutrition requirement/status  ASSESSMENT:   79 yo female admitted with recurrent spontaneous pneumothorax. Chest tube placed 12/31. PMH includes dementia, COPD, COVID-18 Aug 2019, bladder cancer, seizures.   RD working remotely.  Patient is on a regular diet. She consumed 80% of breakfast on 1/1.   Labs reviewed. Prealbumin 11.4 (L) Medications reviewed and include colace, KCl.  Per review of usual weights, patient with 6% weight loss over the past year. She is underweight with BMI=17.2.  Unable to obtain enough information at this time for identification of malnutrition.   NUTRITION - FOCUSED PHYSICAL EXAM:  unable to complete  Diet Order:   Diet Order            Diet regular Room service appropriate? No; Fluid consistency: Thin  Diet effective now              EDUCATION NEEDS:   Not appropriate for education at this time  Skin:  Skin Assessment: Reviewed RN Assessment(R chest tube)  Last BM:  No BM documented  Height:   Ht Readings from Last 1 Encounters:  09/30/19 5\' 5"  (1.651 m)    Weight:   Wt Readings from Last 1 Encounters:  09/30/19 47 kg    Ideal Body Weight:  56.8 kg  BMI:  Body mass index is 17.24 kg/m.  Estimated Nutritional Needs:   Kcal:  1400-1600  Protein:  70-80 gm  Fluid:  >/= 1.4 L    Molli Barrows, RD, LDN, [place] Pager [phone] After Hours Pager [phone]

## 2019-10-01 NOTE — Progress Notes (Signed)
PROGRESS NOTE    Amanda [name]  [id number] DOB: [birth year] DOA: 09/29/2019 PCP: Lemmie Evens, MD   Brief Narrative:  79 yo ww w/ severe COPD and ,ild dementia admitted w/ 3rd or c4th spontaneous PTX in past year. Recently had Covid-19 09-07-2019) and son just died from it. Had chest tube placed in R in ED 12/31 w/ improvement in dyspnea and PTX   Assessment & Plan:   Principal Problem:   Recurrent spontaneous pneumothorax Active Problems:   SOB (shortness of breath)   Spontaneous pneumothorax   Dementia (HCC)   Protein-calorie malnutrition, severe   Emphysema/COPD (HCC)   Hypokalemia    RECURRENT SPONTANEOUS PNEUMOTHORAX  - chest tube in and improved but still has air leak - management per CTS - daughter asked about pleurodesis - defer to CTS per note today they recommend against it secondary to size of air leak - not a surgical candidate  COPD/EMPHYSEMA  - stable  - on Symbicort and albuterol  DEMENTIA  -- seems mild  SEVERE PROTEIN CALORIE MALNUTRITION  - nutrition consult appreciated -Follow-up prealbumin, LFT, PO4  HYPOKALEMIA  - replete - recheck lites daily  DVT prophylaxis: Lovenox SQ  Code Status: Partial DO NOT INTUBATE    Code Status Orders  (From admission, onward)         Start     Ordered   09/29/19 1715  Limited resuscitation (code)  Continuous    Question Answer Comment  In the event of cardiac or respiratory ARREST: Initiate Code Blue, Call Rapid Response Yes   In the event of cardiac or respiratory ARREST: Perform CPR Yes   In the event of cardiac or respiratory ARREST: Perform Intubation/Mechanical Ventilation No   In the event of cardiac or respiratory ARREST: Use NIPPV/BiPAp only if indicated Yes   In the event of cardiac or respiratory ARREST: Administer ACLS medications if indicated Yes   In the event of cardiac or respiratory ARREST: Perform Defibrillation or Cardioversion if indicated Yes      09/29/19 1717         Code Status History    Date Active Date Inactive Code Status Order ID Comments User Context   06/24/2018 1024 06/25/2018 1716 Full Code [id number]  Cvijetic, Forrest Moron, RN Inpatient   Advance Care Planning Activity     Family Communication: none today Disposition Plan:   Patient remained inpatient an additional day secondary to presence of chest tube and air leak. Consults called: None Admission status: Inpatient   Consultants:   CTS  Procedures:  DG CHEST PORT 1 VIEW  Result Date: 10/01/2019 CLINICAL DATA:  Follow-up pneumothorax. EXAM: PORTABLE CHEST 1 VIEW COMPARISON:  09/30/2019 FINDINGS: Right-sided chest tube is again noted with pigtail overlying the right apex. Right-sided pneumothorax is no longer visualized. Small bilateral pleural effusions. The lungs are hyperinflated and there are diffuse coarsened interstitial marking throughout both lungs compatible with advanced COPD/emphysema. Increased interstitial opacities throughout both lungs concerning for either mild pulmonary edema or atypical infection IMPRESSION: 1. Right-sided pneumothorax is no longer visualized. 2. Small bilateral pleural effusions. 3. Superimposed increase interstitial opacities which may represent atypical infection versus edema. Electronically Signed   By: Kerby Moors M.D.   On: 10/01/2019 09:10   Portable chest 1 View  Result Date: 09/30/2019 CLINICAL DATA:  Recurrent spontaneous pneumothorax with chest tube in place. EXAM: PORTABLE CHEST 1 VIEW COMPARISON:  09/29/2019 FINDINGS: Right chest tube in place. a small residual right-sided pneumothorax is identified. This appears increased in  volume compared with 09/29/2019 at 3:55 p.m. Hyperinflation with chronic interstitial changes noted consistent with advanced COPD/emphysema. Diffuse interstitial thickening is noted bilaterally, unchanged. IMPRESSION: 1. Small residual right-sided pneumothorax. This appears increased in volume compared with 09/29/19 at  3:55 p.m. 2. Advanced changes of COPD/emphysema with superimposed increase interstitial markings bilaterally. Electronically Signed   By: Kerby Moors M.D.   On: 09/30/2019 09:44   DG Chest Portable 1 View  Result Date: 09/29/2019 CLINICAL DATA:  COPD. Tube placement. Recent COVID-19 with collapse lung. EXAM: PORTABLE CHEST 1 VIEW COMPARISON:  Earlier today at 2:20 p.m. FINDINGS: 3:55 p.m. Placement of a right-sided chest tube. Midline trachea. Normal heart size. Atherosclerosis in the transverse aorta. Numerous leads and wires project over the chest. Probable trace right pleural fluid. No residual pneumothorax. Diffuse interstitial thickening. Similar bibasilar airspace disease. Left apical pleuroparenchymal scarring. IMPRESSION: Re-expansion of the right lung after chest tube placement. Diffuse interstitial thickening with similar bibasilar airspace disease, most likely atelectasis. Aortic Atherosclerosis (ICD10-I70.0). Electronically Signed   By: Abigail Miyamoto M.D.   On: 09/29/2019 16:07   DG Chest Portable 1 View  Result Date: 09/29/2019 CLINICAL DATA:  History of COVID on Thanksgiving. COPD. Wheezing in the right lung. EXAM: PORTABLE CHEST 1 VIEW COMPARISON:  Chest radiograph 08/30/2019 FINDINGS: Stable cardiomediastinal contours. There is a large right pneumothorax with compressive atelectasis of the right lung. Diffuse bilateral coarse interstitial markings. Lungs are hyperinflated. Biapical pleuroparenchymal scarring. No acute findings in the visualized skeleton. IMPRESSION: 1. Large right pneumothorax with compressive atelectasis of the right lung. Probable fracture in a right lower anterolateral rib. 2. Chronic changes consistent with COPD and chronic bronchitis. These results were called by telephone at the time of interpretation on 09/29/2019 at 2:44 pm to provider Fredia Sorrow , who verbally acknowledged these results. Electronically Signed   By: Audie Pinto M.D.   On: 09/29/2019  14:47     Antimicrobials:   None   Subjective: No acute events overnight resting in bed comfortably  any difficulty with respirations Chest tube site without drainage  Objective: Vitals:   10/01/19 0300 10/01/19 0750 10/01/19 0818 10/01/19 1242  BP: 102/60  (!) 108/51 108/63  Pulse: 99  98 94  Resp: 18  20 (!) 24  Temp: 97.6 F (36.4 C)  98.5 F (36.9 C) 98.9 F (37.2 C)  TempSrc:   Oral Oral  SpO2: 99% 98% 95% 97%  Weight:      Height:        Intake/Output Summary (Last 24 hours) at 10/01/2019 1411 Last data filed at 10/01/2019 [id number] Gross per 24 hour  Intake 240 ml  Output 677 ml  Net -437 ml   Filed Weights   09/29/19 1401 09/30/19 0005  Weight: 47.6 kg 47 kg    Examination:  General exam: Appears calm and comfortable  Respiratory system: Clear to auscultation. Respiratory effort normal. Cardiovascular system: S1 & S2 heard, RRR. No JVD, murmurs, rubs, gallops or clicks. No pedal edema.  Chest tube in place mild rales bilaterally Gastrointestinal system: Abdomen is nondistended, soft and nontender. No organomegaly or masses felt. Normal bowel sounds heard. Central nervous system: Alert and oriented. No focal neurological deficits. Extremities: Warm well perfused Skin: No NEW rashes, lesions or ulcers Psychiatry: Judgement and insight appear normal. Mood & affect appropriate.     Data Reviewed: I have personally reviewed following labs and imaging studies  CBC: Recent Labs  Lab 09/29/19 1546 10/01/19 0256  WBC 11.1* 10.9*  NEUTROABS 9.3*  --  HGB 11.0* 9.9*  HCT 34.2* 30.4*  MCV 95.5 93.8  PLT 390 [id number]   Basic Metabolic Panel: Recent Labs  Lab 09/29/19 1546 10/01/19 0256  NA 138 135  K 3.1* 4.3  CL 98 100  CO2 26 26  GLUCOSE 124* 94  BUN 20 18  CREATININE 0.65 0.74  CALCIUM 9.0 8.9  MG  --  1.9  PHOS  --  3.3   GFR: Estimated Creatinine Clearance: 43 mL/min (by C-G formula based on SCr of 0.74 mg/dL). Liver Function Tests: Recent  Labs  Lab 10/01/19 0256  AST 24  ALT 34  ALKPHOS 60  BILITOT 0.6  PROT 6.1*  ALBUMIN 2.1*   No results for input(s): LIPASE, AMYLASE in the last 168 hours. No results for input(s): AMMONIA in the last 168 hours. Coagulation Profile: No results for input(s): INR, PROTIME in the last 168 hours. Cardiac Enzymes: No results for input(s): CKTOTAL, CKMB, CKMBINDEX, TROPONINI in the last 168 hours. BNP (last 3 results) No results for input(s): PROBNP in the last 8760 hours. HbA1C: No results for input(s): HGBA1C in the last 72 hours. CBG: No results for input(s): GLUCAP in the last 168 hours. Lipid Profile: No results for input(s): CHOL, HDL, LDLCALC, TRIG, CHOLHDL, LDLDIRECT in the last 72 hours. Thyroid Function Tests: No results for input(s): TSH, T4TOTAL, FREET4, T3FREE, THYROIDAB in the last 72 hours. Anemia Panel: Recent Labs    10/01/19 0256  VITAMINB12 224   Sepsis Labs: No results for input(s): PROCALCITON, LATICACIDVEN in the last 168 hours.  Recent Results (from the past 240 hour(s))  Respiratory Panel by RT PCR (Flu A&B, Covid) - Nasopharyngeal Swab     Status: None   Collection Time: 09/29/19  6:05 PM   Specimen: Nasopharyngeal Swab  Result Value Ref Range Status   SARS Coronavirus 2 by RT PCR NEGATIVE NEGATIVE Final    Comment: (NOTE) SARS-CoV-2 target nucleic acids are NOT DETECTED. The SARS-CoV-2 RNA is generally detectable in upper respiratoy specimens during the acute phase of infection. The lowest concentration of SARS-CoV-2 viral copies this assay can detect is 131 copies/mL. A negative result does not preclude SARS-Cov-2 infection and should not be used as the sole basis for treatment or other patient management decisions. A negative result may occur with  improper specimen collection/handling, submission of specimen other than nasopharyngeal swab, presence of viral mutation(s) within the areas targeted by this assay, and inadequate number of viral  copies (<131 copies/mL). A negative result must be combined with clinical observations, patient history, and epidemiological information. The expected result is Negative. Fact Sheet for Patients:  PinkCheek.be Fact Sheet for Healthcare Providers:  GravelBags.it This test is not yet ap proved or cleared by the [place] FDA and  has been authorized for detection and/or diagnosis of SARS-CoV-2 by FDA under an Emergency Use Authorization (EUA). This EUA will remain  in effect (meaning this test can be used) for the duration of the COVID-19 declaration under Section 564(b)(1) of the Act, 21 U.S.C. section 360bbb-3(b)(1), unless the authorization is terminated or revoked sooner.    Influenza A by PCR NEGATIVE NEGATIVE Final   Influenza B by PCR NEGATIVE NEGATIVE Final    Comment: (NOTE) The Xpert Xpress SARS-CoV-2/FLU/RSV assay is intended as an aid in  the diagnosis of influenza from Nasopharyngeal swab specimens and  should not be used as a sole basis for treatment. Nasal washings and  aspirates are unacceptable for Xpert Xpress SARS-CoV-2/FLU/RSV  testing. Fact Sheet for Patients:  PinkCheek.be Fact Sheet for Healthcare Providers: GravelBags.it This test is not yet approved or cleared by the [place] FDA and  has been authorized for detection and/or diagnosis of SARS-CoV-2 by  FDA under an Emergency Use Authorization (EUA). This EUA will remain  in effect (meaning this test can be used) for the duration of the  Covid-19 declaration under Section 564(b)(1) of the Act, 21  U.S.C. section 360bbb-3(b)(1), unless the authorization is  terminated or revoked. Performed at [facility], [address], [place], [place] [address]   MRSA PCR Screening     Status: None   Collection Time: 09/30/19  1:04 AM   Specimen: Nasopharyngeal  Result Value Ref Range Status   MRSA  by PCR NEGATIVE NEGATIVE Final    Comment:        The GeneXpert MRSA Assay (FDA approved for NASAL specimens only), is one component of a comprehensive MRSA colonization surveillance program. It is not intended to diagnose MRSA infection nor to guide or monitor treatment for MRSA infections. Performed at [facility], [place] [address] [place], [place] [address]          Radiology Studies: DG CHEST PORT 1 VIEW  Result Date: 10/01/2019 CLINICAL DATA:  Follow-up pneumothorax. EXAM: PORTABLE CHEST 1 VIEW COMPARISON:  09/30/2019 FINDINGS: Right-sided chest tube is again noted with pigtail overlying the right apex. Right-sided pneumothorax is no longer visualized. Small bilateral pleural effusions. The lungs are hyperinflated and there are diffuse coarsened interstitial marking throughout both lungs compatible with advanced COPD/emphysema. Increased interstitial opacities throughout both lungs concerning for either mild pulmonary edema or atypical infection IMPRESSION: 1. Right-sided pneumothorax is no longer visualized. 2. Small bilateral pleural effusions. 3. Superimposed increase interstitial opacities which may represent atypical infection versus edema. Electronically Signed   By: Kerby Moors M.D.   On: 10/01/2019 09:10   Portable chest 1 View  Result Date: 09/30/2019 CLINICAL DATA:  Recurrent spontaneous pneumothorax with chest tube in place. EXAM: PORTABLE CHEST 1 VIEW COMPARISON:  09/29/2019 FINDINGS: Right chest tube in place. a small residual right-sided pneumothorax is identified. This appears increased in volume compared with 09/29/2019 at 3:55 p.m. Hyperinflation with chronic interstitial changes noted consistent with advanced COPD/emphysema. Diffuse interstitial thickening is noted bilaterally, unchanged. IMPRESSION: 1. Small residual right-sided pneumothorax. This appears increased in volume compared with 09/29/19 at 3:55 p.m. 2. Advanced changes of COPD/emphysema with  superimposed increase interstitial markings bilaterally. Electronically Signed   By: Kerby Moors M.D.   On: 09/30/2019 09:44   DG Chest Portable 1 View  Result Date: 09/29/2019 CLINICAL DATA:  COPD. Tube placement. Recent COVID-19 with collapse lung. EXAM: PORTABLE CHEST 1 VIEW COMPARISON:  Earlier today at 2:20 p.m. FINDINGS: 3:55 p.m. Placement of a right-sided chest tube. Midline trachea. Normal heart size. Atherosclerosis in the transverse aorta. Numerous leads and wires project over the chest. Probable trace right pleural fluid. No residual pneumothorax. Diffuse interstitial thickening. Similar bibasilar airspace disease. Left apical pleuroparenchymal scarring. IMPRESSION: Re-expansion of the right lung after chest tube placement. Diffuse interstitial thickening with similar bibasilar airspace disease, most likely atelectasis. Aortic Atherosclerosis (ICD10-I70.0). Electronically Signed   By: Abigail Miyamoto M.D.   On: 09/29/2019 16:07   DG Chest Portable 1 View  Result Date: 09/29/2019 CLINICAL DATA:  History of COVID on Thanksgiving. COPD. Wheezing in the right lung. EXAM: PORTABLE CHEST 1 VIEW COMPARISON:  Chest radiograph 08/30/2019 FINDINGS: Stable cardiomediastinal contours. There is a large right pneumothorax with compressive atelectasis of the right  lung. Diffuse bilateral coarse interstitial markings. Lungs are hyperinflated. Biapical pleuroparenchymal scarring. No acute findings in the visualized skeleton. IMPRESSION: 1. Large right pneumothorax with compressive atelectasis of the right lung. Probable fracture in a right lower anterolateral rib. 2. Chronic changes consistent with COPD and chronic bronchitis. These results were called by telephone at the time of interpretation on 09/29/2019 at 2:44 pm to provider Fredia Sorrow , who verbally acknowledged these results. Electronically Signed   By: Audie Pinto M.D.   On: 09/29/2019 14:47        Scheduled Meds: . ALPRAZolam  0.5  mg Oral Daily  . docusate sodium  100 mg Oral BID  . enoxaparin (LOVENOX) injection  40 mg Subcutaneous Q24H  . feeding supplement (ENSURE ENLIVE)  237 mL Oral TID BM  . lidocaine-EPINEPHrine  20 mL Intradermal Once  . mouth rinse  15 mL Mouth Rinse BID  . mometasone-formoterol  2 puff Inhalation BID  . multivitamin with minerals  1 tablet Oral Daily  . oxyCODONE-acetaminophen  1 tablet Oral TID  . potassium chloride  40 mEq Oral Daily  . sodium chloride flush  3 mL Intravenous Q12H   Continuous Infusions: . sodium chloride       LOS: 2 days    Time spent: [place], MD [facility]  If 7PM-7AM, please contact night-coverage  10/01/2019, 2:11 PM

## 2019-10-01 NOTE — Progress Notes (Addendum)
        [address]       [place] [address]             [phone]      Subjective: Awake and alert. No new concerns, says she has had no change in breathing effort.   Objective: Vital signs in last 24 hours: Temp:  [97.6 F (36.4 C)-98.5 F (36.9 C)] 98.5 F (36.9 C) (01/02 0818) Pulse Rate:  [91-102] 98 (01/02 0818) Cardiac Rhythm: Normal sinus rhythm (01/02 0700) Resp:  [15-28] 20 (01/02 0818) BP: (91-111)/(51-60) 108/51 (01/02 0818) SpO2:  [91 %-99 %] 95 % (01/02 0818) FiO2 (%):  [3 %] 3 % (01/01 2110)     Intake/Output from previous day: 01/01 0701 - 01/02 0700 In: 480 [P.O.:480] Out: 727 [Urine:675; Chest Tube:52] Intake/Output this shift: No intake/output data recorded.  General appearance: alert, cooperative and no distress Neurologic: intact Heart: regular rate and rhythm Lungs: Breath sounds are clear but diminished bilaterally. CXR shows full expansion of the right lung.  The pleural tube is well secured and functioning appropriately. Slow but continuous air leak in pleurevac.   Lab Results: Recent Labs    09/29/19 1546 10/01/19 0256  WBC 11.1* 10.9*  HGB 11.0* 9.9*  HCT 34.2* 30.4*  PLT 390 393   BMET:  Recent Labs    09/29/19 1546 10/01/19 0256  NA 138 135  K 3.1* 4.3  CL 98 100  CO2 26 26  GLUCOSE 124* 94  BUN 20 18  CREATININE 0.65 0.74  CALCIUM 9.0 8.9    PT/INR: No results for input(s): LABPROT, INR in the last 72 hours. ABG No results found for: PHART, HCO3, TCO2, ACIDBASEDEF, O2SAT CBG (last 3)  No results for input(s): GLUCAP in the last 72 hours.  EXAM: PORTABLE CHEST 1 VIEW  COMPARISON:  09/30/2019  FINDINGS: Right-sided chest tube is again noted with pigtail overlying the right apex. Right-sided pneumothorax is no longer visualized. Small bilateral pleural effusions. The lungs are hyperinflated and there are diffuse coarsened interstitial marking throughout both lungs compatible with advanced  COPD/emphysema. Increased interstitial opacities throughout both lungs concerning for either mild pulmonary edema or atypical infection  IMPRESSION: 1. Right-sided pneumothorax is no longer visualized. 2. Small bilateral pleural effusions. 3. Superimposed increase interstitial opacities which may represent atypical infection versus edema.   Electronically Signed   By: Kerby Moors M.D.   On: 10/01/2019 09:10 Assessment/Plan:  -Recurrent (#3) right spontaneous pneumothorax in the setting of severe COPD. Right lung is fully expanded this AM on CXR, slow but continuous air leak is unchanged. Will continue conservative management for now. May consider sclerosing procedure or endobronchial valve if leak persists.  OK for her to be out of bed from CTS standpoint but prefer to avoid disconnecting Pleruevac from suction.    LOS: 2 days    Antony Odea, PA-C [phone] 10/01/2019  I have seen and examined the patient and agree with the assessment and plan as outlined.  Continue chest tube to suction for now.  Air leak too large to consider chemical pleurodesis at this time.  Rexene Alberts, MD 10/01/2019 11:47 AM

## 2019-10-01 NOTE — Plan of Care (Signed)

## 2019-10-01 NOTE — Progress Notes (Signed)
Daily Progress note   Patient calm, cooperative at bedside. C/O sore pain in rib cage where chest tube is placed. Chest tube -20, Chest tube to suction, with clear yellow fluid.

## 2019-10-02 ENCOUNTER — Inpatient Hospital Stay (HOSPITAL_COMMUNITY): Payer: Medicare Other

## 2019-10-02 NOTE — Progress Notes (Signed)
Changed the patients urostomy bag to a larger one that attached to the bed.  Patient alert x4 today.  Chest tube drained 72ml today

## 2019-10-02 NOTE — Plan of Care (Signed)

## 2019-10-02 NOTE — Progress Notes (Addendum)
  Subjective: Having some soreness at the CT insertion site. No new concerns. Breathing about the same.   Objective: Vital signs in last 24 hours: Temp:  [98 F (36.7 C)-98.9 F (37.2 C)] 98.2 F (36.8 C) (01/03 0759) Pulse Rate:  [84-94] 91 (01/03 0759) Cardiac Rhythm: Normal sinus rhythm (01/03 0700) Resp:  [20-25] 20 (01/03 0759) BP: (95-113)/(49-63) 103/58 (01/03 0759) SpO2:  [94 %-100 %] 94 % (01/03 0804)   Intake/Output from previous day: 01/02 0701 - 01/03 0700 In: 3 [I.V.:3] Out: 663 [Urine:650; Chest Tube:13] Intake/Output this shift: No intake/output data recorded.  Physical Exam: General appearance: alert, cooperative and no distress Neurologic: intact Heart: regular rate and rhythm Lungs: Breath sounds are clear but diminished bilaterally. CXR shows full expansion of the right lung.  The pleural tube is well secured and functioning appropriately. Air leak persists without notable change.  Lab Results: Recent Labs    09/29/19 1546 10/01/19 0256  WBC 11.1* 10.9*  HGB 11.0* 9.9*  HCT 34.2* 30.4*  PLT 390 393   BMET:  Recent Labs    09/29/19 1546 10/01/19 0256  NA 138 135  K 3.1* 4.3  CL 98 100  CO2 26 26  GLUCOSE 124* 94  BUN 20 18  CREATININE 0.65 0.74  CALCIUM 9.0 8.9    PT/INR: No results for input(s): LABPROT, INR in the last 72 hours. ABG No results found for: PHART, HCO3, TCO2, ACIDBASEDEF, O2SAT CBG (last 3)  No results for input(s): GLUCAP in the last 72 hours.  Assessment/Plan:  -Recurrent (#3) right spontaneous pneumothorax in the setting of severe COPD. Right lung remainsfully expanded this AM on CXR, slow but continuous air leak is unchanged. Will continue conservative management for now.  Will consider reducing suction on the Pleurevac to -10cmH2O tomorrow and see if her lung will maintain full expansion at that setting.    LOS: 3 days    Antony Odea, PA-C [phone]  10/02/2019  I have seen and examined the  patient and agree with the assessment and plan as outlined.  Rexene Alberts, MD 10/02/2019 11:25 AM

## 2019-10-02 NOTE — Progress Notes (Signed)
PROGRESS NOTE    Amanda [name]  [id number] DOB: March 31, 1941 DOA: 09/29/2019 PCP: Lemmie Evens, MD   Brief Narrative:  79 yo ww w/ severe COPD and ,ild dementia admitted w/ 3rd or c4th spontaneous PTX in past year. Recently had Covid-19 09-21-2019) and son just died from it. Had chest tube placed in R in ED 12/31 w/ improvement in dyspnea and PTX   Assessment & Plan:   Principal Problem:   Recurrent spontaneous pneumothorax Active Problems:   SOB (shortness of breath)   Spontaneous pneumothorax   Dementia (HCC)   Protein-calorie malnutrition, severe   Emphysema/COPD (HCC)   Hypokalemia   RECURRENT SPONTANEOUS PNEUMOTHORAX  - chest tube in and improved but still has air leak - management per CTS, considering reducing suction tomorrow and monitoring lung inflation - daughter asked about pleurodesis - defer to CTS per note today they recommend against it secondary to size of air leak - not a surgical candidate  COPD/EMPHYSEMA  - stable  - on Symbicort and albuterol  DEMENTIA  -- seems mild  SEVERE PROTEIN CALORIE MALNUTRITION  - nutrition consult appreciated -Follow-up prealbumin, LFT, PO4  HYPOKALEMIA  - replete - recheck lites daily  DVT prophylaxis: Lovenox SQ  Code Status: Partial, DO NOT INTUBATE    Code Status Orders  (From admission, onward)         Start     Ordered   09/29/19 1715  Limited resuscitation (code)  Continuous    Question Answer Comment  In the event of cardiac or respiratory ARREST: Initiate Code Blue, Call Rapid Response Yes   In the event of cardiac or respiratory ARREST: Perform CPR Yes   In the event of cardiac or respiratory ARREST: Perform Intubation/Mechanical Ventilation No   In the event of cardiac or respiratory ARREST: Use NIPPV/BiPAp only if indicated Yes   In the event of cardiac or respiratory ARREST: Administer ACLS medications if indicated Yes   In the event of cardiac or respiratory ARREST: Perform  Defibrillation or Cardioversion if indicated Yes      09/29/19 1717        Code Status History    Date Active Date Inactive Code Status Order ID Comments User Context   06/24/2018 1024 06/25/2018 1716 Full Code [id number]  Cvijetic, Forrest Moron, RN Inpatient   Advance Care Planning Activity     Family Communication: None today Disposition Plan:   Patient remains inpatient for management of chest tube and air leak.  Patient is not stable or safe for medical discharge Consults called: None Admission status: Inpatient   Consultants:   Cardiothoracic surgery  Procedures:  DG CHEST PORT 1 VIEW  Result Date: 10/02/2019 CLINICAL DATA:  Follow-up pneumothorax EXAM: PORTABLE CHEST 1 VIEW COMPARISON:  Chest radiograph 10/01/2019 FINDINGS: Stable cardiomediastinal contours. Right pigtail catheter remains positioned towards the right apex. No definite pneumothorax. The lungs are hyperinflated. There are diffuse coarsened interstitial markings compatible with advanced chronic bronchitis/emphysema. Compared to prior radiographs there appear to be diffusely increased interstitial markings throughout the left greater than right lungs concerning for superimposed edema or infection. Trace right pleural fluid. IMPRESSION: 1. No definite pneumothorax with right chest tube in place. 2. Stable appearance of the lungs with findings of advanced COPD/chronic bronchitis and concern for superimposed edema or infection given increase in interstitial markings compared to prior radiographs. Electronically Signed   By: Audie Pinto M.D.   On: 10/02/2019 10:14   DG CHEST PORT 1 VIEW  Result Date: 10/01/2019 CLINICAL  DATA:  Follow-up pneumothorax. EXAM: PORTABLE CHEST 1 VIEW COMPARISON:  09/30/2019 FINDINGS: Right-sided chest tube is again noted with pigtail overlying the right apex. Right-sided pneumothorax is no longer visualized. Small bilateral pleural effusions. The lungs are hyperinflated and there are diffuse  coarsened interstitial marking throughout both lungs compatible with advanced COPD/emphysema. Increased interstitial opacities throughout both lungs concerning for either mild pulmonary edema or atypical infection IMPRESSION: 1. Right-sided pneumothorax is no longer visualized. 2. Small bilateral pleural effusions. 3. Superimposed increase interstitial opacities which may represent atypical infection versus edema. Electronically Signed   By: Kerby Moors M.D.   On: 10/01/2019 09:10   Portable chest 1 View  Result Date: 09/30/2019 CLINICAL DATA:  Recurrent spontaneous pneumothorax with chest tube in place. EXAM: PORTABLE CHEST 1 VIEW COMPARISON:  09/29/2019 FINDINGS: Right chest tube in place. a small residual right-sided pneumothorax is identified. This appears increased in volume compared with 09/29/2019 at 3:55 p.m. Hyperinflation with chronic interstitial changes noted consistent with advanced COPD/emphysema. Diffuse interstitial thickening is noted bilaterally, unchanged. IMPRESSION: 1. Small residual right-sided pneumothorax. This appears increased in volume compared with 09/29/19 at 3:55 p.m. 2. Advanced changes of COPD/emphysema with superimposed increase interstitial markings bilaterally. Electronically Signed   By: Kerby Moors M.D.   On: 09/30/2019 09:44   DG Chest Portable 1 View  Result Date: 09/29/2019 CLINICAL DATA:  COPD. Tube placement. Recent COVID-19 with collapse lung. EXAM: PORTABLE CHEST 1 VIEW COMPARISON:  Earlier today at 2:20 p.m. FINDINGS: 3:55 p.m. Placement of a right-sided chest tube. Midline trachea. Normal heart size. Atherosclerosis in the transverse aorta. Numerous leads and wires project over the chest. Probable trace right pleural fluid. No residual pneumothorax. Diffuse interstitial thickening. Similar bibasilar airspace disease. Left apical pleuroparenchymal scarring. IMPRESSION: Re-expansion of the right lung after chest tube placement. Diffuse interstitial thickening  with similar bibasilar airspace disease, most likely atelectasis. Aortic Atherosclerosis (ICD10-I70.0). Electronically Signed   By: Abigail Miyamoto M.D.   On: 09/29/2019 16:07   DG Chest Portable 1 View  Result Date: 09/29/2019 CLINICAL DATA:  History of COVID on Thanksgiving. COPD. Wheezing in the right lung. EXAM: PORTABLE CHEST 1 VIEW COMPARISON:  Chest radiograph 08/30/2019 FINDINGS: Stable cardiomediastinal contours. There is a large right pneumothorax with compressive atelectasis of the right lung. Diffuse bilateral coarse interstitial markings. Lungs are hyperinflated. Biapical pleuroparenchymal scarring. No acute findings in the visualized skeleton. IMPRESSION: 1. Large right pneumothorax with compressive atelectasis of the right lung. Probable fracture in a right lower anterolateral rib. 2. Chronic changes consistent with COPD and chronic bronchitis. These results were called by telephone at the time of interpretation on 09/29/2019 at 2:44 pm to provider Fredia Sorrow , who verbally acknowledged these results. Electronically Signed   By: Audie Pinto M.D.   On: 09/29/2019 14:47     Antimicrobials:   None   Subjective: No acute events overnight resting comfortably in bed Chest tube still in place  Objective: Vitals:   10/01/19 2300 10/02/19 0300 10/02/19 0759 10/02/19 0804  BP: 113/61 (!) 95/50 (!) 103/58   Pulse: 93 89 91   Resp: (!) 25 20 20    Temp: 98 F (36.7 C) 98 F (36.7 C) 98.2 F (36.8 C)   TempSrc: Oral Oral Oral   SpO2: 96% 96% 94% 94%  Weight:      Height:        Intake/Output Summary (Last 24 hours) at 10/02/2019 1427 Last data filed at 10/02/2019 0622 Gross per 24 hour  Intake 3 ml  Output 663 ml  Net -660 ml   Filed Weights   09/29/19 1401 09/30/19 0005  Weight: 47.6 kg 47 kg    Examination:  General exam: Appears calm and comfortable  Respiratory system: Clear to auscultation. Respiratory effort normal. Cardiovascular system: S1 & S2 heard,  RRR. No JVD, murmurs, rubs, gallops or clicks. No pedal edema.  Chest tube in place mild rales bilaterally Gastrointestinal system: Abdomen is nondistended, soft and nontender. No organomegaly or masses felt. Normal bowel sounds heard. Central nervous system: Alert and oriented. No focal neurological deficits. Extremities: Warm well perfused Skin: No NEW rashes, lesions or ulcers Psychiatry: Judgement and insight appear normal. Mood & affect appropriate.     Data Reviewed: I have personally reviewed following labs and imaging studies  CBC: Recent Labs  Lab 09/29/19 1546 10/01/19 0256  WBC 11.1* 10.9*  NEUTROABS 9.3*  --   HGB 11.0* 9.9*  HCT 34.2* 30.4*  MCV 95.5 93.8  PLT 390 [id number]   Basic Metabolic Panel: Recent Labs  Lab 09/29/19 1546 10/01/19 0256  NA 138 135  K 3.1* 4.3  CL 98 100  CO2 26 26  GLUCOSE 124* 94  BUN 20 18  CREATININE 0.65 0.74  CALCIUM 9.0 8.9  MG  --  1.9  PHOS  --  3.3   GFR: Estimated Creatinine Clearance: 43 mL/min (by C-G formula based on SCr of 0.74 mg/dL). Liver Function Tests: Recent Labs  Lab 10/01/19 0256  AST 24  ALT 34  ALKPHOS 60  BILITOT 0.6  PROT 6.1*  ALBUMIN 2.1*   No results for input(s): LIPASE, AMYLASE in the last 168 hours. No results for input(s): AMMONIA in the last 168 hours. Coagulation Profile: No results for input(s): INR, PROTIME in the last 168 hours. Cardiac Enzymes: No results for input(s): CKTOTAL, CKMB, CKMBINDEX, TROPONINI in the last 168 hours. BNP (last 3 results) No results for input(s): PROBNP in the last 8760 hours. HbA1C: No results for input(s): HGBA1C in the last 72 hours. CBG: No results for input(s): GLUCAP in the last 168 hours. Lipid Profile: No results for input(s): CHOL, HDL, LDLCALC, TRIG, CHOLHDL, LDLDIRECT in the last 72 hours. Thyroid Function Tests: No results for input(s): TSH, T4TOTAL, FREET4, T3FREE, THYROIDAB in the last 72 hours. Anemia Panel: Recent Labs    10/01/19 0256    VITAMINB12 224   Sepsis Labs: No results for input(s): PROCALCITON, LATICACIDVEN in the last 168 hours.  Recent Results (from the past 240 hour(s))  Respiratory Panel by RT PCR (Flu A&B, Covid) - Nasopharyngeal Swab     Status: None   Collection Time: 09/29/19  6:05 PM   Specimen: Nasopharyngeal Swab  Result Value Ref Range Status   SARS Coronavirus 2 by RT PCR NEGATIVE NEGATIVE Final    Comment: (NOTE) SARS-CoV-2 target nucleic acids are NOT DETECTED. The SARS-CoV-2 RNA is generally detectable in upper respiratoy specimens during the acute phase of infection. The lowest concentration of SARS-CoV-2 viral copies this assay can detect is 131 copies/mL. A negative result does not preclude SARS-Cov-2 infection and should not be used as the sole basis for treatment or other patient management decisions. A negative result may occur with  improper specimen collection/handling, submission of specimen other than nasopharyngeal swab, presence of viral mutation(s) within the areas targeted by this assay, and inadequate number of viral copies (<131 copies/mL). A negative result must be combined with clinical observations, patient history, and epidemiological information. The expected result is Negative. Fact Sheet for Patients:  PinkCheek.be Fact Sheet for Healthcare Providers:  GravelBags.it This test is not yet ap proved or cleared by the [place] FDA and  has been authorized for detection and/or diagnosis of SARS-CoV-2 by FDA under an Emergency Use Authorization (EUA). This EUA will remain  in effect (meaning this test can be used) for the duration of the COVID-19 declaration under Section 564(b)(1) of the Act, 21 U.S.C. section 360bbb-3(b)(1), unless the authorization is terminated or revoked sooner.    Influenza A by PCR NEGATIVE NEGATIVE Final   Influenza B by PCR NEGATIVE NEGATIVE Final    Comment: (NOTE) The Xpert  Xpress SARS-CoV-2/FLU/RSV assay is intended as an aid in  the diagnosis of influenza from Nasopharyngeal swab specimens and  should not be used as a sole basis for treatment. Nasal washings and  aspirates are unacceptable for Xpert Xpress SARS-CoV-2/FLU/RSV  testing. Fact Sheet for Patients: PinkCheek.be Fact Sheet for Healthcare Providers: GravelBags.it This test is not yet approved or cleared by the [place] FDA and  has been authorized for detection and/or diagnosis of SARS-CoV-2 by  FDA under an Emergency Use Authorization (EUA). This EUA will remain  in effect (meaning this test can be used) for the duration of the  Covid-19 declaration under Section 564(b)(1) of the Act, 21  U.S.C. section 360bbb-3(b)(1), unless the authorization is  terminated or revoked. Performed at [facility], [address], [place], [place] [address]   MRSA PCR Screening     Status: None   Collection Time: 09/30/19  1:04 AM   Specimen: Nasopharyngeal  Result Value Ref Range Status   MRSA by PCR NEGATIVE NEGATIVE Final    Comment:        The GeneXpert MRSA Assay (FDA approved for NASAL specimens only), is one component of a comprehensive MRSA colonization surveillance program. It is not intended to diagnose MRSA infection nor to guide or monitor treatment for MRSA infections. Performed at [facility], [place] [address], [place], [place] [address]          Radiology Studies: DG CHEST PORT 1 VIEW  Result Date: 10/02/2019 CLINICAL DATA:  Follow-up pneumothorax EXAM: PORTABLE CHEST 1 VIEW COMPARISON:  Chest radiograph 10/01/2019 FINDINGS: Stable cardiomediastinal contours. Right pigtail catheter remains positioned towards the right apex. No definite pneumothorax. The lungs are hyperinflated. There are diffuse coarsened interstitial markings compatible with advanced chronic bronchitis/emphysema. Compared to prior radiographs there  appear to be diffusely increased interstitial markings throughout the left greater than right lungs concerning for superimposed edema or infection. Trace right pleural fluid. IMPRESSION: 1. No definite pneumothorax with right chest tube in place. 2. Stable appearance of the lungs with findings of advanced COPD/chronic bronchitis and concern for superimposed edema or infection given increase in interstitial markings compared to prior radiographs. Electronically Signed   By: Audie Pinto M.D.   On: 10/02/2019 10:14   DG CHEST PORT 1 VIEW  Result Date: 10/01/2019 CLINICAL DATA:  Follow-up pneumothorax. EXAM: PORTABLE CHEST 1 VIEW COMPARISON:  09/30/2019 FINDINGS: Right-sided chest tube is again noted with pigtail overlying the right apex. Right-sided pneumothorax is no longer visualized. Small bilateral pleural effusions. The lungs are hyperinflated and there are diffuse coarsened interstitial marking throughout both lungs compatible with advanced COPD/emphysema. Increased interstitial opacities throughout both lungs concerning for either mild pulmonary edema or atypical infection IMPRESSION: 1. Right-sided pneumothorax is no longer visualized. 2. Small bilateral pleural effusions. 3. Superimposed increase interstitial opacities which may represent atypical infection versus edema. Electronically Signed  By: Kerby Moors M.D.   On: 10/01/2019 09:10        Scheduled Meds: . ALPRAZolam  0.5 mg Oral Daily  . docusate sodium  100 mg Oral BID  . enoxaparin (LOVENOX) injection  40 mg Subcutaneous Q24H  . feeding supplement (ENSURE ENLIVE)  237 mL Oral TID BM  . lidocaine-EPINEPHrine  20 mL Intradermal Once  . mouth rinse  15 mL Mouth Rinse BID  . mometasone-formoterol  2 puff Inhalation BID  . multivitamin with minerals  1 tablet Oral Daily  . oxyCODONE-acetaminophen  1 tablet Oral TID  . sodium chloride flush  3 mL Intravenous Q12H   Continuous Infusions: . sodium chloride       LOS: 3 days      Time spent: 35 min    Nicolette Bang, MD [facility]  If 7PM-7AM, please contact night-coverage  10/02/2019, 2:27 PM

## 2019-10-03 ENCOUNTER — Inpatient Hospital Stay (HOSPITAL_COMMUNITY): Payer: Medicare Other

## 2019-10-03 DIAGNOSIS — J9311 Primary spontaneous pneumothorax: Secondary | ICD-10-CM

## 2019-10-03 NOTE — Progress Notes (Signed)
   Vital Signs MEWS/VS Documentation      10/03/2019 0740 10/03/2019 0741 10/03/2019 0748 10/03/2019 0900   MEWS Score:  2  2  -  5   MEWS Score Color:  Yellow  Yellow  -  Red   Resp:  (!) 24  -  -  (!) 37   Pulse:  95  -  -  (!) 104   BP:  (!) 95/54  -  -  -   Temp:  98.2 F (36.8 C)  -  -  -   O2 Device:  -  -  Nasal Cannula  -   O2 Flow Rate (L/min):  -  -  33 L/min  -   Level of Consciousness:  -  -  -  Alert      Went in room to assess elevated RR. Pt shows this RN photos of son that recently passed away due to COVID-19. Pt appears to be a little anxious and is using the finger the O2 probe is on to type on phone. Once patient stops typing on phone RR lowers to the low 20's. Xanax administered as scheduled to help pt with anxiety. Pt does not appear to be in any distress at this time and has no current complaints. Will continue to monitor.     Courtlynn [place]oman 10/03/2019,9:25 AM

## 2019-10-03 NOTE — Progress Notes (Addendum)
      [address]       [place] [address]             [phone]           Subjective: Patient states her breathing is "good"  Objective: Vital signs in last 24 hours: Temp:  [98.2 F (36.8 C)-98.4 F (36.9 C)] 98.2 F (36.8 C) (01/04 0740) Pulse Rate:  [91-102] 95 (01/04 0740) Cardiac Rhythm: Normal sinus rhythm (01/03 1900) Resp:  [14-27] 24 (01/04 0740) BP: (85-117)/(53-68) 95/54 (01/04 0740) SpO2:  [83 %-100 %] 94 % (01/04 0740)     Intake/Output from previous day: 01/03 0701 - 01/04 0700 In: -  Out: 525 [Urine:500; Chest Tube:25]   Physical Exam:  Cardiovascular: RRR Pulmonary: Clear to auscultation bilaterally but diminished overall Wounds: Dressing intact   Chest Tube: to 20 cm suction, +++ air leak  Lab Results: CBC: Recent Labs    10/01/19 0256  WBC 10.9*  HGB 9.9*  HCT 30.4*  PLT 393   BMET:  Recent Labs    10/01/19 0256  NA 135  K 4.3  CL 100  CO2 26  GLUCOSE 94  BUN 18  CREATININE 0.74  CALCIUM 8.9    PT/INR: No results for input(s): LABPROT, INR in the last 72 hours. ABG:  INR: Will add last result for INR, ABG once components are confirmed Will add last 4 CBG results once components are confirmed  Assessment/Plan:  1. CV - SR. 2.  Pulmonary - History of COPD. On 3 liters or oxygen via [place]. Chest tube with 25 cc last 24 hours, chest tube is to suction 20 cm and there is a +++ air leak. CXR this am is stable (no pneumothorax, small right pleural effusion). Will discuss with surgeon if should decrease suction. Check CXR in am   Donielle M ZimmermanPA-C 10/03/2019,7:46 AM [id number]  I have seen and examined the patient and agree with the assessment and plan as outlined.  Will try to slowly wean suction and see if lung remains reexpanded.  Rexene Alberts, MD 10/03/2019 7:59 AM

## 2019-10-03 NOTE — Progress Notes (Signed)
PROGRESS NOTE    Amanda [name]  [id number] DOB: 05-09-1941 DOA: 09/29/2019 PCP: Lemmie Evens, MD   Brief Narrative: Per HPI 79 year old female W/ severe COPD and ,ild dementia admitted w/ 3rd or c4th spontaneous PTX in past year. Recently had Covid-19 Sep 12, 2019) and son just died from it. Had chest tube placed in R in ED 12/31 w/ improvement in dyspnea and PTX.  Patient seen by cardiothoracic surgery. Patient has chest tube in place on the right lung remains fully expanded on repeat chest x-ray 1/4.  Subjective: Seen and examined this morning. No fever overnight's. Resting comfortably  Assessment & Plan:  Recurrent spontaneous pneumothorax: chest tube in place on the right lung remains fully expanded on repeat chest x-ray 1/4.  On 3 L nasal cannula.  Saturating well.  Continue chest tube plan as per CT surgery. Repeat chest x-ray in the morning. Daughter asked about pleurodesis - defer to CTSper note today they recommend against it secondary to size of air leak/ not a surgical candidate  Dementia continue supportive care.    Severe emphysema/COPD/chronic hypoxic respiratory failure: Stable on Symbicort albuterol.  Continue pulmonary support.  Hypokalemia-last potassium level improved.  Labs in a.m.  History of coronavirus a month ago, was admitted at outside facility and had a spontaneous pneumothorax that was treated with chest tube at that time.  COVID-19 PCR and AG negative on admission. Nutrition: Nutrition Problem: Increased nutrient needs Etiology: chronic illness(COPD, underweight) Signs/Symptoms: estimated needs Interventions: Ensure Enlive (each supplement provides 350kcal and 20 grams of protein), MVI  Body mass index is 17.24 kg/m.    DVT prophylaxis: lovenox Code Status: Not intubate Family Communication: plan of care discussed with patient at bedside.  Reports she is outside of town currently living with a daughter locally. Disposition Plan: Remains  inpatient pending clinical improvement, and once chest tube is removed.   Consultants: CT surgery Procedures: Chest tube  CXR 1/4 Chest tube on the right without evident pneumothorax. Underlying emphysematous change and fibrosis. Small right pleural effusion. No edema or consolidation is demonstrable. Stable cardiac silhouette. Carotid artery calcification noted bilaterally.  Microbiology: none Antimicrobials: Anti-infectives (From admission, onward)   None       Objective: Vitals:   10/03/19 1020 10/03/19 1025 10/03/19 1234 10/03/19 1420  BP:   (!) 109/58   Pulse: 96 95 (!) 102 97  Resp: (!) 23 (!) 21 (!) 27   Temp:   97.6 F (36.4 C)   TempSrc:   Oral   SpO2: 96% 97% 92% 97%  Weight:      Height:        Intake/Output Summary (Last 24 hours) at 10/03/2019 1509 Last data filed at 10/03/2019 0900 Gross per 24 hour  Intake --  Output 990 ml  Net -990 ml   Filed Weights   09/29/19 1401 09/30/19 0005  Weight: 47.6 kg 47 kg   Weight change:   Body mass index is 17.24 kg/m.  Intake/Output from previous day: 01/03 0701 - 01/04 0700 In: -  Out: 540 [Urine:500; Chest Tube:40] Intake/Output this shift: Total I/O In: -  Out: 450 [Urine:450]  Examination:  General exam: AAO, frail, elderly on nasal cannula Weak appearing. HEENT:Oral mucosa moist, Ear/Nose WNL grossly, dentition normal. Respiratory system: Diminished breath sounds bilaterally, chest tube from the right chest.   Cardiovascular system: S1 & S2 +, No JVD,. Gastrointestinal system: Abdomen soft, NT,ND, BS+ Nervous System:Alert, awake, moving extremities and grossly nonfocal Extremities: No edema, distal peripheral pulses palpable.  Skin: No rashes,no icterus. MSK: Normal muscle bulk,tone, power  Medications:  Scheduled Meds: . ALPRAZolam  0.5 mg Oral Daily  . docusate sodium  100 mg Oral BID  . enoxaparin (LOVENOX) injection  40 mg Subcutaneous Q24H  . feeding supplement (ENSURE ENLIVE)  237 mL  Oral TID BM  . lidocaine-EPINEPHrine  20 mL Intradermal Once  . mouth rinse  15 mL Mouth Rinse BID  . mometasone-formoterol  2 puff Inhalation BID  . multivitamin with minerals  1 tablet Oral Daily  . oxyCODONE-acetaminophen  1 tablet Oral TID  . sodium chloride flush  3 mL Intravenous Q12H   Continuous Infusions: . sodium chloride      Data Reviewed: I have personally reviewed following labs and imaging studies  CBC: Recent Labs  Lab 09/29/19 1546 10/01/19 0256  WBC 11.1* 10.9*  NEUTROABS 9.3*  --   HGB 11.0* 9.9*  HCT 34.2* 30.4*  MCV 95.5 93.8  PLT 390 [id number]   Basic Metabolic Panel: Recent Labs  Lab 09/29/19 1546 10/01/19 0256  NA 138 135  K 3.1* 4.3  CL 98 100  CO2 26 26  GLUCOSE 124* 94  BUN 20 18  CREATININE 0.65 0.74  CALCIUM 9.0 8.9  MG  --  1.9  PHOS  --  3.3   GFR: Estimated Creatinine Clearance: 43 mL/min (by C-G formula based on SCr of 0.74 mg/dL). Liver Function Tests: Recent Labs  Lab 10/01/19 0256  AST 24  ALT 34  ALKPHOS 60  BILITOT 0.6  PROT 6.1*  ALBUMIN 2.1*   No results for input(s): LIPASE, AMYLASE in the last 168 hours. No results for input(s): AMMONIA in the last 168 hours. Coagulation Profile: No results for input(s): INR, PROTIME in the last 168 hours. Cardiac Enzymes: No results for input(s): CKTOTAL, CKMB, CKMBINDEX, TROPONINI in the last 168 hours. BNP (last 3 results) No results for input(s): PROBNP in the last 8760 hours. HbA1C: No results for input(s): HGBA1C in the last 72 hours. CBG: No results for input(s): GLUCAP in the last 168 hours. Lipid Profile: No results for input(s): CHOL, HDL, LDLCALC, TRIG, CHOLHDL, LDLDIRECT in the last 72 hours. Thyroid Function Tests: No results for input(s): TSH, T4TOTAL, FREET4, T3FREE, THYROIDAB in the last 72 hours. Anemia Panel: Recent Labs    10/01/19 0256  VITAMINB12 224   Sepsis Labs: No results for input(s): PROCALCITON, LATICACIDVEN in the last 168 hours.  Recent  Results (from the past 240 hour(s))  Respiratory Panel by RT PCR (Flu A&B, Covid) - Nasopharyngeal Swab     Status: None   Collection Time: 09/29/19  6:05 PM   Specimen: Nasopharyngeal Swab  Result Value Ref Range Status   SARS Coronavirus 2 by RT PCR NEGATIVE NEGATIVE Final    Comment: (NOTE) SARS-CoV-2 target nucleic acids are NOT DETECTED. The SARS-CoV-2 RNA is generally detectable in upper respiratoy specimens during the acute phase of infection. The lowest concentration of SARS-CoV-2 viral copies this assay can detect is 131 copies/mL. A negative result does not preclude SARS-Cov-2 infection and should not be used as the sole basis for treatment or other patient management decisions. A negative result may occur with  improper specimen collection/handling, submission of specimen other than nasopharyngeal swab, presence of viral mutation(s) within the areas targeted by this assay, and inadequate number of viral copies (<131 copies/mL). A negative result must be combined with clinical observations, patient history, and epidemiological information. The expected result is Negative. Fact Sheet for Patients:  PinkCheek.be Fact  Sheet for Healthcare Providers:  GravelBags.it This test is not yet ap proved or cleared by the [place] FDA and  has been authorized for detection and/or diagnosis of SARS-CoV-2 by FDA under an Emergency Use Authorization (EUA). This EUA will remain  in effect (meaning this test can be used) for the duration of the COVID-19 declaration under Section 564(b)(1) of the Act, 21 U.S.C. section 360bbb-3(b)(1), unless the authorization is terminated or revoked sooner.    Influenza A by PCR NEGATIVE NEGATIVE Final   Influenza B by PCR NEGATIVE NEGATIVE Final    Comment: (NOTE) The Xpert Xpress SARS-CoV-2/FLU/RSV assay is intended as an aid in  the diagnosis of influenza from Nasopharyngeal swab specimens  and  should not be used as a sole basis for treatment. Nasal washings and  aspirates are unacceptable for Xpert Xpress SARS-CoV-2/FLU/RSV  testing. Fact Sheet for Patients: PinkCheek.be Fact Sheet for Healthcare Providers: GravelBags.it This test is not yet approved or cleared by the [place] FDA and  has been authorized for detection and/or diagnosis of SARS-CoV-2 by  FDA under an Emergency Use Authorization (EUA). This EUA will remain  in effect (meaning this test can be used) for the duration of the  Covid-19 declaration under Section 564(b)(1) of the Act, 21  U.S.C. section 360bbb-3(b)(1), unless the authorization is  terminated or revoked. Performed at [facility], [address] [place], [place] [address]   MRSA PCR Screening     Status: None   Collection Time: 09/30/19  1:04 AM   Specimen: Nasopharyngeal  Result Value Ref Range Status   MRSA by PCR NEGATIVE NEGATIVE Final    Comment:        The GeneXpert MRSA Assay (FDA approved for NASAL specimens only), is one component of a comprehensive MRSA colonization surveillance program. It is not intended to diagnose MRSA infection nor to guide or monitor treatment for MRSA infections. Performed at [facility], [place] [address], [place], [place] [address]       Radiology Studies: DG CHEST PORT 1 VIEW  Result Date: 10/03/2019 CLINICAL DATA:  Recent pneumothorax.  History of bladder carcinoma EXAM: PORTABLE CHEST 1 VIEW COMPARISON:  October 02, 2019 and August 30, 2019 FINDINGS: Chest tube remains on the right with the tip in the right apex region. Currently there is no pneumothorax appreciable. There is underlying fibrosis throughout the lungs. There is a small right pleural effusion. There is no frank edema or consolidation. The heart size is normal. Pulmonary vascularity reflects underlying emphysematous changes and is stable. There is aortic atherosclerosis.  No adenopathy evident. Calcification is noted in each carotid artery. IMPRESSION: Chest tube on the right without evident pneumothorax. Underlying emphysematous change and fibrosis. Small right pleural effusion. No edema or consolidation is demonstrable. Stable cardiac silhouette. Carotid artery calcification noted bilaterally. Aortic Atherosclerosis (ICD10-I70.0) and Emphysema (ICD10-J43.9). Electronically Signed   By: Lowella Grip III M.D.   On: 10/03/2019 07:35   DG CHEST PORT 1 VIEW  Result Date: 10/02/2019 CLINICAL DATA:  Follow-up pneumothorax EXAM: PORTABLE CHEST 1 VIEW COMPARISON:  Chest radiograph 10/01/2019 FINDINGS: Stable cardiomediastinal contours. Right pigtail catheter remains positioned towards the right apex. No definite pneumothorax. The lungs are hyperinflated. There are diffuse coarsened interstitial markings compatible with advanced chronic bronchitis/emphysema. Compared to prior radiographs there appear to be diffusely increased interstitial markings throughout the left greater than right lungs concerning for superimposed edema or infection. Trace right pleural fluid. IMPRESSION: 1. No definite pneumothorax with right chest tube  in place. 2. Stable appearance of the lungs with findings of advanced COPD/chronic bronchitis and concern for superimposed edema or infection given increase in interstitial markings compared to prior radiographs. Electronically Signed   By: Audie Pinto M.D.   On: 10/02/2019 10:14      LOS: 4 days   Time spent: More than 50% of that time was spent in counseling and/or coordination of care.  Antonieta Pert, MD [facility]  10/03/2019, 3:09 PM

## 2019-10-03 NOTE — Care Management Important Message (Signed)
Important Message  Patient Details  Name: Amanda [name] MRN: [id number] Date of Birth: [birth year]   Medicare Important Message Given:  Yes     [name] 10/03/2019, 3:30 PM

## 2019-10-04 ENCOUNTER — Inpatient Hospital Stay (HOSPITAL_COMMUNITY): Payer: Medicare Other

## 2019-10-04 LAB — BASIC METABOLIC PANEL
Anion gap: 12 (ref 5–15)
Anion gap: 11 (ref 5–15)
BUN: 14 mg/dL (ref 8–23)
BUN: 14 mg/dL (ref 8–23)
CO2: 25 mmol/L (ref 22–32)
CO2: 27 mmol/L (ref 22–32)
Calcium: 9.2 mg/dL (ref 8.9–10.3)
Calcium: 9.2 mg/dL (ref 8.9–10.3)
Chloride: 101 mmol/L (ref 98–111)
Chloride: 98 mmol/L (ref 98–111)
Creatinine, Ser: 0.8 mg/dL (ref 0.44–1.00)
Creatinine, Ser: 0.72 mg/dL (ref 0.44–1.00)
GFR calc Af Amer: >60 mL/min (ref >60)
GFR calc Af Amer: >60 mL/min (ref >60)
GFR calc non Af Amer: >60 mL/min (ref >60)
GFR calc non Af Amer: >60 mL/min (ref >60)
Glucose, Bld: 103 mg/dL — ABNORMAL HIGH (ref 70–99)
Glucose, Bld: 110 mg/dL — ABNORMAL HIGH (ref 70–99)
Potassium: 5.4 mmol/L — ABNORMAL HIGH (ref 3.5–5.1)
Potassium: 4.3 mmol/L (ref 3.5–5.1)
Sodium: 138 mmol/L (ref 135–145)
Sodium: 136 mmol/L (ref 135–145)

## 2019-10-04 LAB — CBC
HCT: 30.1 % — ABNORMAL LOW (ref 36.0–46.0)
Hemoglobin: 9.8 g/dL — ABNORMAL LOW (ref 12.0–15.0)
MCH: 30.5 pg (ref 26.0–34.0)
MCHC: 32.6 g/dL (ref 30.0–36.0)
MCV: 93.8 fL (ref 80.0–100.0)
Platelets: 418 10*3/uL — ABNORMAL HIGH (ref 150–400)
RBC: 3.21 MIL/uL — ABNORMAL LOW (ref 3.87–5.11)
RDW: 13.9 % (ref 11.5–15.5)
WBC: 8.4 10*3/uL (ref 4.0–10.5)
nRBC: 0 % (ref 0.0–0.2)

## 2019-10-04 NOTE — Progress Notes (Signed)
PROGRESS NOTE    DELESA [name]  [id number] DOB: 05-06-1941 DOA: 09/29/2019 PCP: Lemmie Evens, MD   Brief Narrative: Per HPI 79 year old female W/ severe COPD and ,ild dementia admitted w/ 3rd or c4th spontaneous PTX in past year. Recently had Covid-19 Sep 17, 2019) and son just died from it. Had chest tube placed in R in ED 12/31 w/ improvement in dyspnea and PTX.  Patient seen by cardiothoracic surgery. Patient has chest tube in place on the right lung remains fully expanded on repeat chest x-ray 1/4.  Subjective: No acute events overnight. Resting comfortably-on 3.5 L nasal cannula saturating well.[place] this morning stable No chest pain nausea vomiting or shortness of breath.  Assessment & Plan:  Recurrent spontaneous pneumothorax: chest tube in place on the right-10 cc last 24 hours, chest tube is to suction 10 cm in greatest posteriorly.  Chest x-ray appears to stable this morning.  Appreciated CT surgery improved for possible chest tube to waterseal and repeat x-ray in the morning. Daughter asked about pleurodesis - defer to CTS-they recommend against it secondary to size of air leak/ not a surgical candidate  Dementia, mild: continue supportive care.    Severe emphysema/COPD/chronic hypoxic respiratory failure on 3 L cannula at home: Stable on Symbicort albuterol.  Continue pulmonary support.  Mental effusion.  Hypokalemia-appears slightly elevated at 5.4.  Repeat this afternoon-if still high or worsening can use Lokelma  History of coronavirus a month ago, was admitted at outside facility and had a spontaneous pneumothorax that was treated with chest tube at that time.  COVID-19 PCR and AG negative on admission. Nutrition: Nutrition Problem: Increased nutrient needs Etiology: chronic illness(COPD, underweight) Signs/Symptoms: estimated needs Interventions: Ensure Enlive (each supplement provides 350kcal and 20 grams of protein), MVI  Body mass index is 16.29 kg/m.     DVT prophylaxis: lovenox Code Status: Not intubate Family Communication: plan of care discussed with patient at bedside.  Reports she is outside of town currently living with a daughter locally. I called her daughter to update Disposition Plan: Remains inpatient to manage her chest tube and resp status. PT OT eval pending.   Consultants: CT surgery Procedures: Chest tube  CXR 1/4 Chest tube on the right without evident pneumothorax. Underlying emphysematous change and fibrosis. Small right pleural effusion. No edema or consolidation is demonstrable. Stable cardiac silhouette. Carotid artery calcification noted bilaterally.  Microbiology: none Antimicrobials: Anti-infectives (From admission, onward)   None       Objective: Vitals:   10/03/19 2014 10/04/19 0000 10/04/19 0303 10/04/19 0731  BP: (!) 107/51 (!) 101/53 (!) 103/59   Pulse:  99    Resp: 14 (!) 23    Temp:   98.1 F (36.7 C)   TempSrc:   Oral   SpO2:  98%  96%  Weight:   44.4 kg   Height:        Intake/Output Summary (Last 24 hours) at 10/04/2019 0748 Last data filed at 10/03/2019 2000 Gross per 24 hour  Intake 240 ml  Output 910 ml  Net -670 ml   Filed Weights   09/29/19 1401 09/30/19 0005 10/04/19 0303  Weight: 47.6 kg 47 kg 44.4 kg   Weight change:   Body mass index is 16.29 kg/m.  Intake/Output from previous day: 01/04 0701 - 01/05 0700 In: 240 [P.O.:240] Out: 910 [Urine:900; Chest Tube:10] Intake/Output this shift: No intake/output data recorded.  Examination:  General exam: Alert awake oriented elderly frail on nasal cannula not in distress.  HEENT:Oral mucosa moist, Ear/Nose WNL grossly, dentition normal. Respiratory system: Bilateral air entry present and clear, no wheezing no crackles chest tube from the right chest.   Cardiovascular system: S1 & S2 +, No JVD,. Gastrointestinal system: Abdomen soft nontender nondistended bowel sounds present  Nervous System:Alert, awake, moving  extremities and grossly nonfocal Extremities: No ankle edema, distal peripheral pulses palpable.  Skin: No rashes,no icterus. MSK: Normal muscle bulk,tone, power  Medications:  Scheduled Meds: . ALPRAZolam  0.5 mg Oral Daily  . docusate sodium  100 mg Oral BID  . enoxaparin (LOVENOX) injection  40 mg Subcutaneous Q24H  . feeding supplement (ENSURE ENLIVE)  237 mL Oral TID BM  . lidocaine-EPINEPHrine  20 mL Intradermal Once  . mouth rinse  15 mL Mouth Rinse BID  . mometasone-formoterol  2 puff Inhalation BID  . multivitamin with minerals  1 tablet Oral Daily  . oxyCODONE-acetaminophen  1 tablet Oral TID  . sodium chloride flush  3 mL Intravenous Q12H   Continuous Infusions: . sodium chloride      Data Reviewed: I have personally reviewed following labs and imaging studies  CBC: Recent Labs  Lab 09/29/19 1546 10/01/19 0256 10/04/19 0452  WBC 11.1* 10.9* 8.4  NEUTROABS 9.3*  --   --   HGB 11.0* 9.9* 9.8*  HCT 34.2* 30.4* 30.1*  MCV 95.5 93.8 93.8  PLT 390 393 [id number]*   Basic Metabolic Panel: Recent Labs  Lab 09/29/19 1546 10/01/19 0256 10/04/19 0452  NA 138 135 138  K 3.1* 4.3 5.4*  CL 98 100 101  CO2 26 26 25   GLUCOSE 124* 94 103*  BUN 20 18 14   CREATININE 0.65 0.74 0.80  CALCIUM 9.0 8.9 9.2  MG  --  1.9  --   PHOS  --  3.3  --    GFR: Estimated Creatinine Clearance: 40.6 mL/min (by C-G formula based on SCr of 0.8 mg/dL). Liver Function Tests: Recent Labs  Lab 10/01/19 0256  AST 24  ALT 34  ALKPHOS 60  BILITOT 0.6  PROT 6.1*  ALBUMIN 2.1*   No results for input(s): LIPASE, AMYLASE in the last 168 hours. No results for input(s): AMMONIA in the last 168 hours. Coagulation Profile: No results for input(s): INR, PROTIME in the last 168 hours. Cardiac Enzymes: No results for input(s): CKTOTAL, CKMB, CKMBINDEX, TROPONINI in the last 168 hours. BNP (last 3 results) No results for input(s): PROBNP in the last 8760 hours. HbA1C: No results for input(s):  HGBA1C in the last 72 hours. CBG: No results for input(s): GLUCAP in the last 168 hours. Lipid Profile: No results for input(s): CHOL, HDL, LDLCALC, TRIG, CHOLHDL, LDLDIRECT in the last 72 hours. Thyroid Function Tests: No results for input(s): TSH, T4TOTAL, FREET4, T3FREE, THYROIDAB in the last 72 hours. Anemia Panel: No results for input(s): VITAMINB12, FOLATE, FERRITIN, TIBC, IRON, RETICCTPCT in the last 72 hours. Sepsis Labs: No results for input(s): PROCALCITON, LATICACIDVEN in the last 168 hours.  Recent Results (from the past 240 hour(s))  Respiratory Panel by RT PCR (Flu A&B, Covid) - Nasopharyngeal Swab     Status: None   Collection Time: 09/29/19  6:05 PM   Specimen: Nasopharyngeal Swab  Result Value Ref Range Status   SARS Coronavirus 2 by RT PCR NEGATIVE NEGATIVE Final    Comment: (NOTE) SARS-CoV-2 target nucleic acids are NOT DETECTED. The SARS-CoV-2 RNA is generally detectable in upper respiratoy specimens during the acute phase of infection. The lowest concentration of SARS-CoV-2 viral copies this  assay can detect is 131 copies/mL. A negative result does not preclude SARS-Cov-2 infection and should not be used as the sole basis for treatment or other patient management decisions. A negative result may occur with  improper specimen collection/handling, submission of specimen other than nasopharyngeal swab, presence of viral mutation(s) within the areas targeted by this assay, and inadequate number of viral copies (<131 copies/mL). A negative result must be combined with clinical observations, patient history, and epidemiological information. The expected result is Negative. Fact Sheet for Patients:  PinkCheek.be Fact Sheet for Healthcare Providers:  GravelBags.it This test is not yet ap proved or cleared by the [place] FDA and  has been authorized for detection and/or diagnosis of SARS-CoV-2 by FDA  under an Emergency Use Authorization (EUA). This EUA will remain  in effect (meaning this test can be used) for the duration of the COVID-19 declaration under Section 564(b)(1) of the Act, 21 U.S.C. section 360bbb-3(b)(1), unless the authorization is terminated or revoked sooner.    Influenza A by PCR NEGATIVE NEGATIVE Final   Influenza B by PCR NEGATIVE NEGATIVE Final    Comment: (NOTE) The Xpert Xpress SARS-CoV-2/FLU/RSV assay is intended as an aid in  the diagnosis of influenza from Nasopharyngeal swab specimens and  should not be used as a sole basis for treatment. Nasal washings and  aspirates are unacceptable for Xpert Xpress SARS-CoV-2/FLU/RSV  testing. Fact Sheet for Patients: PinkCheek.be Fact Sheet for Healthcare Providers: GravelBags.it This test is not yet approved or cleared by the [place] FDA and  has been authorized for detection and/or diagnosis of SARS-CoV-2 by  FDA under an Emergency Use Authorization (EUA). This EUA will remain  in effect (meaning this test can be used) for the duration of the  Covid-19 declaration under Section 564(b)(1) of the Act, 21  U.S.C. section 360bbb-3(b)(1), unless the authorization is  terminated or revoked. Performed at [facility], [address], [place], [place] [address]   MRSA PCR Screening     Status: None   Collection Time: 09/30/19  1:04 AM   Specimen: Nasopharyngeal  Result Value Ref Range Status   MRSA by PCR NEGATIVE NEGATIVE Final    Comment:        The GeneXpert MRSA Assay (FDA approved for NASAL specimens only), is one component of a comprehensive MRSA colonization surveillance program. It is not intended to diagnose MRSA infection nor to guide or monitor treatment for MRSA infections. Performed at [facility], [place] [address] [place], [place] [address]       Radiology Studies: DG CHEST PORT 1 VIEW  Result Date: 10/03/2019 CLINICAL DATA:   Recent pneumothorax.  History of bladder carcinoma EXAM: PORTABLE CHEST 1 VIEW COMPARISON:  October 02, 2019 and August 30, 2019 FINDINGS: Chest tube remains on the right with the tip in the right apex region. Currently there is no pneumothorax appreciable. There is underlying fibrosis throughout the lungs. There is a small right pleural effusion. There is no frank edema or consolidation. The heart size is normal. Pulmonary vascularity reflects underlying emphysematous changes and is stable. There is aortic atherosclerosis. No adenopathy evident. Calcification is noted in each carotid artery. IMPRESSION: Chest tube on the right without evident pneumothorax. Underlying emphysematous change and fibrosis. Small right pleural effusion. No edema or consolidation is demonstrable. Stable cardiac silhouette. Carotid artery calcification noted bilaterally. Aortic Atherosclerosis (ICD10-I70.0) and Emphysema (ICD10-J43.9). Electronically Signed   By: Lowella Grip III M.D.   On: 10/03/2019 07:35   DG  CHEST PORT 1 VIEW  Result Date: 10/02/2019 CLINICAL DATA:  Follow-up pneumothorax EXAM: PORTABLE CHEST 1 VIEW COMPARISON:  Chest radiograph 10/01/2019 FINDINGS: Stable cardiomediastinal contours. Right pigtail catheter remains positioned towards the right apex. No definite pneumothorax. The lungs are hyperinflated. There are diffuse coarsened interstitial markings compatible with advanced chronic bronchitis/emphysema. Compared to prior radiographs there appear to be diffusely increased interstitial markings throughout the left greater than right lungs concerning for superimposed edema or infection. Trace right pleural fluid. IMPRESSION: 1. No definite pneumothorax with right chest tube in place. 2. Stable appearance of the lungs with findings of advanced COPD/chronic bronchitis and concern for superimposed edema or infection given increase in interstitial markings compared to prior radiographs. Electronically Signed   By:  Audie Pinto M.D.   On: 10/02/2019 10:14      LOS: 5 days   Time spent: More than 50% of that time was spent in counseling and/or coordination of care.  Antonieta Pert, MD [facility]  10/04/2019, 7:48 AM

## 2019-10-04 NOTE — Progress Notes (Addendum)
      [address]       [place] [address]             [phone]           Subjective: Patient has no specific complaint this am  Objective: Vital signs in last 24 hours: Temp:  [97.6 F (36.4 C)-98.5 F (36.9 C)] 98.1 F (36.7 C) (01/05 0303) Pulse Rate:  [95-104] 99 (01/05 0000) Cardiac Rhythm: Normal sinus rhythm (01/04 1900) Resp:  [14-37] 23 (01/05 0000) BP: (94-109)/(49-59) 103/59 (01/05 0303) SpO2:  [90 %-99 %] 98 % (01/05 0000) Weight:  [44.4 kg] 44.4 kg (01/05 0303)     Intake/Output from previous day: 01/04 0701 - 01/05 0700 In: 240 [P.O.:240] Out: 910 [Urine:900; Chest Tube:10]   Physical Exam:  Cardiovascular: RRR Pulmonary: Clear to auscultation bilaterally but diminished overall Wounds: Dressing intact   Chest Tube: to 10 cm suction, + air leak but less so than yesterday  Lab Results: CBC: Recent Labs    10/04/19 0452  WBC 8.4  HGB 9.8*  HCT 30.1*  PLT 418*   BMET:  Recent Labs    10/04/19 0452  NA 138  K 5.4*  CL 101  CO2 25  GLUCOSE 103*  BUN 14  CREATININE 0.80  CALCIUM 9.2    PT/INR: No results for input(s): LABPROT, INR in the last 72 hours. ABG:  INR: Will add last result for INR, ABG once components are confirmed Will add last 4 CBG results once components are confirmed  Assessment/Plan:  1. CV - SR. 2.  Pulmonary - History of COPD. On 3 liters or oxygen via [place]. Chest tube with 10 cc last 24 hours, chest tube is to suction 10 cm and there is a + air leak. CXR this am appears stable (no pneumothorax, small right pleural effusion). Hope to place chest tube to water seal as tolerated decreased suction. Check CXR in am   Donielle M ZimmermanPA-C 10/04/2019,7:29 AM [id number]   I have seen and examined the patient and agree with the assessment and plan as outlined.  Chest tube to water seal today.  Rexene [place], MD 10/04/2019 8:47 AM

## 2019-10-04 NOTE — TOC Initial Note (Signed)
Transition of Care [facility]) - Initial/Assessment Note    Patient Details  Name: Amanda [name] MRN: [id number] Date of Birth: Dec 22, 1940  Transition of Care [facility]) CM/SW Contact:    Pollie Friar, RN Phone Number: 10/04/2019, 2:22 PM  Clinical Narrative:                 CM spoke to patients daughter: Amanda [name] about [facility] services. Decision for [place] reached and Amanda [name] with [facility] accepted.  TOC following for further d/c needs.  Expected Discharge Plan: [place] Barriers to Discharge: Continued Medical Work up   Patient Goals and CMS Choice   CMS Medicare.gov Compare Post Acute Care list provided to:: Patient Represenative (must comment) Choice offered to / list presented to : Adult Children  Expected Discharge Plan and Services Expected Discharge Plan: [place]   Discharge Planning Services: CM Consult Post Acute Care Choice: [place] arrangements for the past 2 months: [place]: [place] Date [facility] Contacted: 10/04/19   Representative spoke with at [place]: Amanda [name]  Prior Living Arrangements/Services Living arrangements for the past 2 months: [place] Lives with:: Adult Children, Other (Comment)(daughter and granddaughter) Patient language and need for interpreter reviewed:: Yes        Need for Family Participation in Patient Care: Yes (Comment) Care giver support system in place?: Yes (comment)(daughter during day and granddaughter at night)   Criminal Activity/Legal Involvement Pertinent to Current Situation/Hospitalization: No - Comment as needed  Activities of Daily Living Home Assistive Devices/Equipment: Environmental consultant (specify type)(4 Wheel roller) ADL Screening (condition at time of admission) Patient's cognitive ability adequate to safely complete daily activities?: Yes Is the patient deaf or have difficulty hearing?: No Does the patient have  difficulty seeing, even when wearing glasses/contacts?: No Does the patient have difficulty concentrating, remembering, or making decisions?: No Patient able to express need for assistance with ADLs?: Yes Does the patient have difficulty dressing or bathing?: No Independently performs ADLs?: Yes (appropriate for developmental age) Does the patient have difficulty walking or climbing stairs?: Yes Weakness of Legs: Both Weakness of Arms/Hands: None  Permission Sought/Granted                  Emotional Assessment           Psych Involvement: No (comment)  Admission diagnosis:  Spontaneous pneumothorax [J93.83] 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 Hypokalemia 09/30/2019  . Recurrent spontaneous pneumothorax 09/29/2019  . Emphysema/COPD ([place]) 07/06/2018  . Protein-calorie malnutrition, severe 06/25/2018  . Dementia ([place]) 06/22/2018  . SOB (shortness of breath) 06/21/2018  . Spontaneous pneumothorax 06/21/2018   PCP:  Lemmie Evens, MD Pharmacy:   [facility], [place] [place] [address] Phone: [phone] Fax: [phone]     Social Determinants of Health (SDOH) Interventions    Readmission Risk Interventions No flowsheet data found.

## 2019-10-04 NOTE — Evaluation (Signed)
Physical Therapy Evaluation Patient Details Name: AKAI SWEETING MRN: [id number] DOB: [birth year] Today's Date: 10/04/2019   History of Present Illness  ROSALYNNE PRYER is a 79 y.o. female with medical history significant of  known history of multiple spontaneous pneumothorax on the right, she has had a prior history of COPD as well as a history of bladder cancer.  Pt was COVID+ last month and son just passed on 10-16-23 from [place]. She presents to the hospital with worsening shortness of breath.  Clinical Impression  Pt admitted with above. Pt mobility greatly limited by DOE. Pt with SOB and report of 4/4 on DOE scale when transferring from bed to chair. SpO2 dec to 83% on 3Lo2 via [place]. Pt requiring minA for transfers and ADls. Pt reports she can stay with her granddaughter who can provide 24/7 assist upon d/c. Acute PT to cont to follow to progress ambulation and indep with mobility.    Follow Up Recommendations Home health PT;Supervision/Assistance - 24 hour    Equipment Recommendations  None recommended by PT(has RW)    Recommendations for Other Services       Precautions / Restrictions Precautions Precautions: Fall Precaution Comments: watch SpO2 with mobility, R chest tube Restrictions Weight Bearing Restrictions: No      Mobility  Bed Mobility Overal bed mobility: Needs Assistance Bed Mobility: Supine to Sit     Supine to sit: Min guard     General bed mobility comments: increased time, rolled to L and pulled upon bed rail, able to bring LEs off EOB, min guard for safety and lines  Transfers Overall transfer level: Needs assistance Equipment used: Rolling walker (2 wheeled) Transfers: Sit to/from [facility] Sit to Stand: Min assist Stand pivot transfers: Min assist       General transfer comment: minA to power up and steady, minA for waker management and lines to complete std pvt. Pt SpO2 dec to 83% on 3LO2 via [place] during stand pvt with DOE at 4/4.    Ambulation/Gait             General Gait Details: limited to std pvt ot chair due to dec in SpO2 to 83% on SOB  Stairs            Wheelchair Mobility    Modified Rankin (Stroke Patients Only)       Balance Overall balance assessment: Needs assistance Sitting-balance support: Feet supported;No upper extremity supported Sitting balance-Leahy Scale: Fair     Standing balance support: Bilateral upper extremity supported Standing balance-Leahy Scale: Poor Standing balance comment: dependent on RW                             Pertinent Vitals/Pain Pain Assessment: 0-10 Pain Score: 7  Pain Location: R flank at chest tube Pain Descriptors / Indicators: Sharp;Constant Pain Intervention(s): Monitored during session    Home Living Family/patient expects to be discharged to:: Private residence Living Arrangements: Alone(pt was living with son, however he passed away on 10/16/2023 ) Available Help at Discharge: Family;Available 24 hours/day Type of Home: House Home Access: Level entry     Home Layout: One level Home Equipment: Walker - 4 wheels Additional Comments: pt reports she can stay with her granddaughter and have 24/7 assist. Pt did live with son but is now alone as pt's on passed away on [id number] from covid complications    Prior Function Level of Independence: Independent with assistive device(s)  Comments: pt uses rollator, recently daughter has been helping with showering and ADLs due to SOB     [place] Dominance   Dominant Patella: Right    Extremity/Trunk Assessment   Upper Extremity Assessment Upper Extremity Assessment: Generalized weakness    Lower Extremity Assessment Lower Extremity Assessment: Generalized weakness    Cervical / Trunk Assessment Cervical / Trunk Assessment: Lordotic(chest tube)  Communication   Communication: HOH  Cognition Arousal/Alertness: Awake/alert Behavior During Therapy: WFL for tasks  assessed/performed Overall Cognitive Status: Within Functional Limits for tasks assessed                                 General Comments: 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, pt also tearful regarding her son's passing on 12/26 from [place].       General Comments General comments (skin integrity, edema, etc.): pt with chest tube, pt with noted SOB    Exercises     Assessment/Plan    PT Assessment Patient needs continued PT services  PT Problem List Decreased strength;Decreased range of motion;Decreased activity tolerance;Decreased balance;Decreased mobility;Decreased coordination;Decreased cognition;Decreased knowledge of use of DME;Decreased safety awareness       PT Treatment Interventions DME instruction;Gait training;Stair training;Functional mobility training;Therapeutic activities;Therapeutic exercise;Balance training;Neuromuscular re-education;Cognitive remediation    PT Goals (Current goals can be found in the Care Plan section)  Acute Rehab PT Goals PT Goal Formulation: With patient Time For Goal Achievement: 10/18/19 Potential to Achieve Goals: Fair    Frequency Min 3X/week   Barriers to discharge        Co-evaluation               AM-PAC PT "6 Clicks" Mobility  Outcome Measure Help needed turning from your back to your side while in a flat bed without using bedrails?: A Little Help needed moving from lying on your back to sitting on the side of a flat bed without using bedrails?: A Little Help needed moving to and from a bed to a chair (including a wheelchair)?: A Little Help needed standing up from a chair using your arms (e.g., wheelchair or bedside chair)?: A Little Help needed to walk in hospital room?: A Little Help needed climbing 3-5 steps with a railing? : A Lot 6 Click Score: 17    End of Session Equipment Utilized During Treatment: Oxygen(3LO2 via [place]) Activity Tolerance: Patient limited by fatigue Patient left: in  chair;with call bell/phone within reach Nurse Communication: Mobility status;Other (comment)(Drop in SpO2) PT Visit Diagnosis: Muscle weakness (generalized) (M62.81);Difficulty in walking, not elsewhere classified (R26.2)    Time: [id number] PT Time Calculation (min) (ACUTE ONLY): 25 min   Charges:   PT Evaluation $PT Eval Moderate Complexity: 1 Mod PT Treatments $Therapeutic Activity: 8-22 mins        Kittie Plater, PT, DPT Acute Rehabilitation Services Pager #: [phone] Office #: [phone]   Berline Lopes 10/04/2019, 11:06 AM

## 2019-10-05 ENCOUNTER — Inpatient Hospital Stay (HOSPITAL_COMMUNITY): Payer: Medicare Other

## 2019-10-05 LAB — CBC
HCT: 29.7 % — ABNORMAL LOW (ref 36.0–46.0)
Hemoglobin: 9.5 g/dL — ABNORMAL LOW (ref 12.0–15.0)
MCH: 30.4 pg (ref 26.0–34.0)
MCHC: 32 g/dL (ref 30.0–36.0)
MCV: 95.2 fL (ref 80.0–100.0)
Platelets: 439 10*3/uL — ABNORMAL HIGH (ref 150–400)
RBC: 3.12 MIL/uL — ABNORMAL LOW (ref 3.87–5.11)
RDW: 14.1 % (ref 11.5–15.5)
WBC: 7.9 10*3/uL (ref 4.0–10.5)
nRBC: 0 % (ref 0.0–0.2)

## 2019-10-05 LAB — BASIC METABOLIC PANEL
Anion gap: 11 (ref 5–15)
BUN: 13 mg/dL (ref 8–23)
CO2: 25 mmol/L (ref 22–32)
Calcium: 8.9 mg/dL (ref 8.9–10.3)
Chloride: 100 mmol/L (ref 98–111)
Creatinine, Ser: 0.7 mg/dL (ref 0.44–1.00)
GFR calc Af Amer: >60 mL/min (ref >60)
GFR calc non Af Amer: >60 mL/min (ref >60)
Glucose, Bld: 94 mg/dL (ref 70–99)
Potassium: 4.5 mmol/L (ref 3.5–5.1)
Sodium: 136 mmol/L (ref 135–145)

## 2019-10-05 NOTE — Progress Notes (Signed)
PROGRESS NOTE    Amanda [name]  [id number] DOB: 02-Nov-1940 DOA: 09/29/2019 PCP: Lemmie Evens, MD   Brief Narrative: Per HPI 79 year old female W/ severe COPD and ,ild dementia admitted w/ 3rd or c4th spontaneous PTX in past year. Recently had Covid-19 Sep 17, 2019) and son just died from it. Had chest tube placed in R in ED 12/31 w/ improvement in dyspnea and PTX.  Patient seen by cardiothoracic surgery. Patient has chest tube in place on the right lung remains fully expanded on repeat chest x-ray 1/4. 1/6-chest tube to waterseal.  Subjective: Resting comfortably no nausea vomiting chest pain. On home oxygen setting. Reports that the surgeon told that he needs to stay for few days.  Assessment & Plan:  Recurrent spontaneous pneumothorax: Chest tube remains in place, repeat chest x-ray.  Stable.  Keeping chest tube to waterseal and leaving it for few days.  Noted surgery plans to consider talc pleurodesis if air leak resolves vs endobronchial valves if it persists.  Dementia, mild: continue supportive care.    Severe emphysema/COPD/chronic hypoxic respiratory failure on 3 L cannula at home: Stable on Symbicort albuterol.  Continue pulmonary support.  Hypokalemia-potassium is stable.    History of coronavirus a month ago, was admitted at outside facility and had a spontaneous pneumothorax that was treated with chest tube at that time.  COVID-19 PCR and AG negative on admission. Nutrition: Nutrition Problem: Increased nutrient needs Etiology: chronic illness(COPD, underweight) Signs/Symptoms: estimated needs Interventions: Ensure Enlive (each supplement provides 350kcal and 20 grams of protein), MVI  Body mass index is 16.29 kg/m.    DVT prophylaxis: lovenox Code Status: Not intubate Family Communication: plan of care discussed with patient at bedside.  Reports she is outside of town currently living with a daughter locally. I had called her daughter and updated  1/5 Disposition Plan: Remains inpatient to manage her chest tube and home once able to come off chest tube, may need further procedure if unable to have chest tube removed or if air leak persist.    Consultants: CT surgery Procedures: Chest tube  CXR 1/4 Chest tube on the right without evident pneumothorax. Underlying emphysematous change and fibrosis. Small right pleural effusion. No edema or consolidation is demonstrable. Stable cardiac silhouette. Carotid artery calcification noted bilaterally.  Microbiology: none Antimicrobials: Anti-infectives (From admission, onward)   None       Objective: Vitals:   10/05/19 0300 10/05/19 0313 10/05/19 0654 10/05/19 0830  BP:  (!) 109/50 108/62 (!) 101/54  Pulse: 89  94 95  Resp: 18  (!) 24 20  Temp:  98.1 F (36.7 C)  98 F (36.7 C)  TempSrc:  Oral  Oral  SpO2: 97%  96% 95%  Weight:      Height:        Intake/Output Summary (Last 24 hours) at 10/05/2019 1113 Last data filed at 10/05/2019 1044 Gross per 24 hour  Intake --  Output 400 ml  Net -400 ml   Filed Weights   09/29/19 1401 09/30/19 0005 10/04/19 0303  Weight: 47.6 kg 47 kg 44.4 kg   Weight change:   Body mass index is 16.29 kg/m.  Intake/Output from previous day: 01/05 0701 - 01/06 0700 In: -  Out: 400 [Urine:400] Intake/Output this shift: No intake/output data recorded.  Examination:  General exam: Alert awake oriented elderly frail on nasal cannula not in distress.   HEENT:Oral mucosa moist, Ear/Nose WNL grossly, dentition normal. Respiratory system: Bilateral diminished breath sounds, is clear, chest tube in  the right chest.   Cardiovascular system: S1 & S2 +, No JVD,. Gastrointestinal system: Abdomen soft nontender nondistended.   Nervous System:Alert, awake, moving extremities and grossly nonfocal Extremities: No ankle edema, distal peripheral pulses palpable.  Skin: No rashes,no icterus. MSK: Normal muscle bulk,tone,  power  Medications:  Scheduled Meds: . ALPRAZolam  0.5 mg Oral Daily  . docusate sodium  100 mg Oral BID  . enoxaparin (LOVENOX) injection  40 mg Subcutaneous Q24H  . feeding supplement (ENSURE ENLIVE)  237 mL Oral TID BM  . lidocaine-EPINEPHrine  20 mL Intradermal Once  . mouth rinse  15 mL Mouth Rinse BID  . mometasone-formoterol  2 puff Inhalation BID  . multivitamin with minerals  1 tablet Oral Daily  . oxyCODONE-acetaminophen  1 tablet Oral TID  . sodium chloride flush  3 mL Intravenous Q12H   Continuous Infusions: . sodium chloride      Data Reviewed: I have personally reviewed following labs and imaging studies  CBC: Recent Labs  Lab 09/29/19 1546 10/01/19 0256 10/04/19 0452 10/05/19 0343  WBC 11.1* 10.9* 8.4 7.9  NEUTROABS 9.3*  --   --   --   HGB 11.0* 9.9* 9.8* 9.5*  HCT 34.2* 30.4* 30.1* 29.7*  MCV 95.5 93.8 93.8 95.2  PLT 390 393 418* [id number]*   Basic Metabolic Panel: Recent Labs  Lab 09/29/19 1546 10/01/19 0256 10/04/19 0452 10/04/19 1237 10/05/19 0343  NA 138 135 138 136 136  K 3.1* 4.3 5.4* 4.3 4.5  CL 98 100 101 98 100  CO2 26 26 25 27 25   GLUCOSE 124* 94 103* 110* 94  BUN 20 18 14 14 13   CREATININE 0.65 0.74 0.80 0.72 0.70  CALCIUM 9.0 8.9 9.2 9.2 8.9  MG  --  1.9  --   --   --   PHOS  --  3.3  --   --   --    GFR: Estimated Creatinine Clearance: 40.6 mL/min (by C-G formula based on SCr of 0.7 mg/dL). Liver Function Tests: Recent Labs  Lab 10/01/19 0256  AST 24  ALT 34  ALKPHOS 60  BILITOT 0.6  PROT 6.1*  ALBUMIN 2.1*   No results for input(s): LIPASE, AMYLASE in the last 168 hours. No results for input(s): AMMONIA in the last 168 hours. Coagulation Profile: No results for input(s): INR, PROTIME in the last 168 hours. Cardiac Enzymes: No results for input(s): CKTOTAL, CKMB, CKMBINDEX, TROPONINI in the last 168 hours. BNP (last 3 results) No results for input(s): PROBNP in the last 8760 hours. HbA1C: No results for input(s):  HGBA1C in the last 72 hours. CBG: No results for input(s): GLUCAP in the last 168 hours. Lipid Profile: No results for input(s): CHOL, HDL, LDLCALC, TRIG, CHOLHDL, LDLDIRECT in the last 72 hours. Thyroid Function Tests: No results for input(s): TSH, T4TOTAL, FREET4, T3FREE, THYROIDAB in the last 72 hours. Anemia Panel: No results for input(s): VITAMINB12, FOLATE, FERRITIN, TIBC, IRON, RETICCTPCT in the last 72 hours. Sepsis Labs: No results for input(s): PROCALCITON, LATICACIDVEN in the last 168 hours.  Recent Results (from the past 240 hour(s))  Respiratory Panel by RT PCR (Flu A&B, Covid) - Nasopharyngeal Swab     Status: None   Collection Time: 09/29/19  6:05 PM   Specimen: Nasopharyngeal Swab  Result Value Ref Range Status   SARS Coronavirus 2 by RT PCR NEGATIVE NEGATIVE Final    Comment: (NOTE) SARS-CoV-2 target nucleic acids are NOT DETECTED. The SARS-CoV-2 RNA is generally detectable in  upper respiratoy specimens during the acute phase of infection. The lowest concentration of SARS-CoV-2 viral copies this assay can detect is 131 copies/mL. A negative result does not preclude SARS-Cov-2 infection and should not be used as the sole basis for treatment or other patient management decisions. A negative result may occur with  improper specimen collection/handling, submission of specimen other than nasopharyngeal swab, presence of viral mutation(s) within the areas targeted by this assay, and inadequate number of viral copies (<131 copies/mL). A negative result must be combined with clinical observations, patient history, and epidemiological information. The expected result is Negative. Fact Sheet for Patients:  PinkCheek.be Fact Sheet for Healthcare Providers:  GravelBags.it This test is not yet ap proved or cleared by the [place] FDA and  has been authorized for detection and/or diagnosis of SARS-CoV-2 by FDA  under an Emergency Use Authorization (EUA). This EUA will remain  in effect (meaning this test can be used) for the duration of the COVID-19 declaration under Section 564(b)(1) of the Act, 21 U.S.C. section 360bbb-3(b)(1), unless the authorization is terminated or revoked sooner.    Influenza A by PCR NEGATIVE NEGATIVE Final   Influenza B by PCR NEGATIVE NEGATIVE Final    Comment: (NOTE) The Xpert Xpress SARS-CoV-2/FLU/RSV assay is intended as an aid in  the diagnosis of influenza from Nasopharyngeal swab specimens and  should not be used as a sole basis for treatment. Nasal washings and  aspirates are unacceptable for Xpert Xpress SARS-CoV-2/FLU/RSV  testing. Fact Sheet for Patients: PinkCheek.be Fact Sheet for Healthcare Providers: GravelBags.it This test is not yet approved or cleared by the [place] FDA and  has been authorized for detection and/or diagnosis of SARS-CoV-2 by  FDA under an Emergency Use Authorization (EUA). This EUA will remain  in effect (meaning this test can be used) for the duration of the  Covid-19 declaration under Section 564(b)(1) of the Act, 21  U.S.C. section 360bbb-3(b)(1), unless the authorization is  terminated or revoked. Performed at [facility], [address], [place], [place] [address]   MRSA PCR Screening     Status: None   Collection Time: 09/30/19  1:04 AM   Specimen: Nasopharyngeal  Result Value Ref Range Status   MRSA by PCR NEGATIVE NEGATIVE Final    Comment:        The GeneXpert MRSA Assay (FDA approved for NASAL specimens only), is one component of a comprehensive MRSA colonization surveillance program. It is not intended to diagnose MRSA infection nor to guide or monitor treatment for MRSA infections. Performed at [facility], [place] [address] [place], [place] [address]       Radiology Studies: DG CHEST PORT 1 VIEW  Result Date: 10/05/2019 CLINICAL DATA:   Recurrent pneumothorax. Shortness of breath. EXAM: PORTABLE CHEST 1 VIEW COMPARISON:  10/04/2019 FINDINGS: A right chest tube terminates over the lung apex, unchanged. The cardiomediastinal silhouette is unchanged with normal heart size. The lungs remain hyperinflated with unchanged diffuse interstitial densities and biapical scarring. No definite superimposed airspace consolidation is identified. There is a small right pleural effusion. No pneumothorax is identified. IMPRESSION: 1. Stable position of right chest tube without a pneumothorax identified. 2. Chronic interstitial lung disease and small right pleural effusion. Electronically Signed   By: Logan Bores M.D.   On: 10/05/2019 08:55   DG CHEST PORT 1 VIEW  Result Date: 10/04/2019 CLINICAL DATA:  Shortness of breath.  Follow-up pneumothorax. EXAM: PORTABLE CHEST 1 VIEW COMPARISON:  10/03/2019.  06/22/2018.  CT 06/22/2018. FINDINGS: Right chest tube in stable position. No pneumothorax noted. Heart size stable. Stable changes of chronic interstitial lung disease. Stable bilateral pleural-parenchymal thickening consistent with scarring. IMPRESSION: 1.  Right chest tube in stable position.  No pneumothorax. 2. Stable chronic interstitial lung disease and bilateral pleural-parenchymal scarring. Electronically Signed   By: Marcello Moores  Register   On: 10/04/2019 07:52      LOS: 6 days   Time spent: More than 50% of that time was spent in counseling and/or coordination of care.  Antonieta Pert, MD [facility]  10/05/2019, 11:13 AM

## 2019-10-05 NOTE — Progress Notes (Signed)
Physical Therapy Treatment Patient Details Name: Amanda [name] MRN: [id number] DOB: 01-28-1941 Today's Date: 10/05/2019    History of Present Illness Amanda [name] is a 79 y.o. female with medical history significant of  known history of multiple spontaneous pneumothorax on the right, she has had a prior history of COPD as well as a history of bladder cancer.  Pt was COVID+ last month and son just passed on Oct 12, 2023 from [place]. She presents to the hospital with worsening shortness of breath.    PT Comments    Pt pleasent, motivated, and agreeable to work with therapy. SpO2 dropped to 85% with each activity (supine>sit, Sit>stand, SPT, Marching) Cues required to slow breathing. Pt reports she responds best to "relax" to help her decrease her RR. RR reached 40 with BPM at 140 when first rising into a sitting position. Vitals recovered in ~1 min with rest. On 3 L O2 throughout session.    Follow Up Recommendations  Home health PT;Supervision/Assistance - 24 hour     Equipment Recommendations  None recommended by PT(has RW)    Recommendations for Other Services       Precautions / Restrictions Precautions Precautions: Fall Precaution Comments: watch SpO2 with mobility, R chest tube Restrictions Weight Bearing Restrictions: No    Mobility  Bed Mobility Overal bed mobility: Needs Assistance Bed Mobility: Supine to Sit     Supine to sit: Min guard     General bed mobility comments: increased time, rolled to L and pulled upon bed rail, able to bring LEs off EOB, min guard for safety and lines  Transfers Overall transfer level: Needs assistance Equipment used: Rolling walker (2 wheeled) Transfers: Sit to/from [facility] Sit to Stand: Min assist Stand pivot transfers: Min guard       General transfer comment: min A to stabalize RW as pt declines putting hands on arm rests to push off.  Min guard for transfer and assist with line  management.  Ambulation/Gait Ambulation/Gait assistance: Min guard Gait Distance (Feet): (marching in place) Assistive device: Rolling walker (2 wheeled)       General Gait Details: Pt able to march in place 2x. 1x for ~2 seconds, and the second time for ~8 seconds. Seated rest required between each attempt.   Stairs             Wheelchair Mobility    Modified Rankin (Stroke Patients Only)       Balance Overall balance assessment: Needs assistance Sitting-balance support: Feet supported;No upper extremity supported Sitting balance-Leahy Scale: Fair     Standing balance support: Bilateral upper extremity supported Standing balance-Leahy Scale: Poor Standing balance comment: dependent on RW                            Cognition Arousal/Alertness: Awake/alert Behavior During Therapy: WFL for tasks assessed/performed Overall Cognitive Status: Within Functional Limits for tasks assessed                                 General Comments: 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      Exercises      General Comments        Pertinent Vitals/Pain Pain Assessment: Faces Faces Pain Scale: Hurts a little bit Pain Location: R flank at chest tube Pain Descriptors / Indicators: Sharp;Constant Pain Intervention(s): Monitored during session;Limited activity within patient's tolerance;Repositioned  Home Living                      Prior Function            PT Goals (current goals can now be found in the care plan section) Acute Rehab PT Goals PT Goal Formulation: With patient Time For Goal Achievement: 10/18/19 Potential to Achieve Goals: Fair Progress towards PT goals: Progressing toward goals    Frequency    Min 3X/week      PT Plan Current plan remains appropriate    Co-evaluation              AM-PAC PT "6 Clicks" Mobility   Outcome Measure  Help needed turning from your back to your side while in a  flat bed without using bedrails?: A Little Help needed moving from lying on your back to sitting on the side of a flat bed without using bedrails?: A Little Help needed moving to and from a bed to a chair (including a wheelchair)?: A Little Help needed standing up from a chair using your arms (e.g., wheelchair or bedside chair)?: A Little Help needed to walk in hospital room?: A Little Help needed climbing 3-5 steps with a railing? : A Lot 6 Click Score: 17    End of Session Equipment Utilized During Treatment: Oxygen(3LO2 via [place]) Activity Tolerance: Patient limited by fatigue;Patient tolerated treatment well Patient left: in chair;with call bell/phone within reach Nurse Communication: Mobility status;Other (comment)(Drop in SpO2) PT Visit Diagnosis: Muscle weakness (generalized) (M62.81);Difficulty in walking, not elsewhere classified (R26.2)     Time: [id number] PT Time Calculation (min) (ACUTE ONLY): 25 min  Charges:  $Gait Training: 8-22 mins $Therapeutic Activity: 8-22 mins                     Benjiman Core, [place] Pager [phone] Acute Rehab  Allena Katz 10/05/2019, 11:37 AM

## 2019-10-05 NOTE — Progress Notes (Signed)
Pt stated she had been refusing ensure d/t gi issues related to supplement. Pt stated ok with drinking boost only.

## 2019-10-05 NOTE — Progress Notes (Addendum)
      [address]       [place] [address]             [phone]           Subjective: Patient has no specific complaint this am. She states Dr. Roxy Manns said "tube has to stay for a few days"  Objective: Vital signs in last 24 hours: Temp:  [98 F (36.7 C)-98.6 F (37 C)] 98.1 F (36.7 C) (01/06 0313) Pulse Rate:  [89-96] 94 (01/06 0654) Cardiac Rhythm: Normal sinus rhythm (01/05 1900) Resp:  [18-28] 24 (01/06 0654) BP: (94-111)/(48-62) 108/62 (01/06 0654) SpO2:  [96 %-100 %] 96 % (01/06 0654)     Intake/Output from previous day: 01/05 0701 - 01/06 0700 In: -  Out: 400 [Urine:400]   Physical Exam:  Cardiovascular: RRR Pulmonary: Clear to auscultation bilaterally but diminished overall Wounds: Dressing intact   Chest Tube: to water seal, + 1 intermittent air leak   Lab Results: CBC: Recent Labs    10/04/19 0452 10/05/19 0343  WBC 8.4 7.9  HGB 9.8* 9.5*  HCT 30.1* 29.7*  PLT 418* 439*   BMET:  Recent Labs    10/04/19 1237 10/05/19 0343  NA 136 136  K 4.3 4.5  CL 98 100  CO2 27 25  GLUCOSE 110* 94  BUN 14 13  CREATININE 0.72 0.70  CALCIUM 9.2 8.9    PT/INR: No results for input(s): LABPROT, INR in the last 72 hours. ABG:  INR: Will add last result for INR, ABG once components are confirmed Will add last 4 CBG results once components are confirmed  Assessment/Plan:  1. CV - SR. 2.  Pulmonary - History of COPD. On 3 liters or oxygen via [place]. Chest tube with scant output last 24 hours, chest tube is to water seal and there is a + 1, intermittent air leak. CXR this am appears stable. Will leave chest tube for a few days to water seal, then consider removal. Check CXR in am   Donielle M ZimmermanPA-C 10/05/2019,7:39 AM   I have seen and examined the patient and agree with the assessment and plan as outlined.  Will consider talc pleurodesis if air leak resolves versus endobronchial valves if it persists.  Rexene Alberts,  MD 10/05/2019 9:07 AM

## 2019-10-05 NOTE — Progress Notes (Signed)
C.n.a. reports pt saw a little girl in room (none was noted by staff).

## 2019-10-06 ENCOUNTER — Inpatient Hospital Stay (HOSPITAL_COMMUNITY): Payer: Medicare Other

## 2019-10-06 DIAGNOSIS — J9311 Primary spontaneous pneumothorax: Secondary | ICD-10-CM

## 2019-10-06 LAB — AMMONIA: Ammonia: 16 umol/L (ref 9–35)

## 2019-10-06 LAB — URINALYSIS, ROUTINE W REFLEX MICROSCOPIC
Bilirubin Urine: NEGATIVE
Glucose, UA: NEGATIVE mg/dL
Hgb urine dipstick: NEGATIVE
Ketones, ur: NEGATIVE mg/dL
Leukocytes,Ua: NEGATIVE
Nitrite: NEGATIVE
Protein, ur: 30 mg/dL — AB
Specific Gravity, Urine: 1.015 (ref 1.005–1.030)
pH: 7 (ref 5.0–8.0)

## 2019-10-06 LAB — CREATININE, SERUM
Creatinine, Ser: 0.76 mg/dL (ref 0.44–1.00)
GFR calc Af Amer: >60 mL/min (ref >60)
GFR calc non Af Amer: >60 mL/min (ref >60)

## 2019-10-06 LAB — TSH: TSH: 0.635 u[IU]/mL (ref 0.350–4.500)

## 2019-10-06 LAB — VITAMIN B12: Vitamin B-12: 280 pg/mL (ref 180–914)

## 2019-10-06 MED ORDER — BOOST / RESOURCE BREEZE PO LIQD CUSTOM
1.0000 | Freq: Three times a day (TID) | ORAL | Status: DC
  Administered 2019-10-06 – 2019-10-19 (×31): 1 via ORAL

## 2019-10-06 MED ORDER — POLYETHYLENE GLYCOL 3350 17 G PO PACK
17.0000 g | PACK | Freq: Every day | ORAL | Status: DC | PRN
  Administered 2019-10-06 – 2019-10-11 (×3): 17 g via ORAL
  Filled 2019-10-06 (×5): qty 1

## 2019-10-06 MED ORDER — SENNA 8.6 MG PO TABS
1.0000 | ORAL_TABLET | Freq: Every day | ORAL | Status: DC
  Administered 2019-10-06 – 2019-10-19 (×12): 8.6 mg via ORAL
  Filled 2019-10-06 (×12): qty 1

## 2019-10-06 MED ORDER — OXYCODONE-ACETAMINOPHEN 5-325 MG PO TABS: 1.0000 | ORAL_TABLET | Freq: Three times a day (TID) | ORAL | Status: DC | PRN

## 2019-10-06 NOTE — Progress Notes (Addendum)
      [address]       [place] [address]             [phone]           Subjective: Patient states room is cold. She states her breathing is good  Objective: Vital signs in last 24 hours: Temp:  [97.9 F (36.6 C)-99.1 F (37.3 C)] 98.6 F (37 C) (01/07 0409) Pulse Rate:  [55-95] 91 (01/07 0500) Cardiac Rhythm: Normal sinus rhythm (01/06 1903) Resp:  [16-25] 16 (01/07 0409) BP: (97-109)/(52-61) 102/52 (01/07 0409) SpO2:  [82 %-100 %] 100 % (01/07 0500)     Intake/Output from previous day: 01/06 0701 - 01/07 0700 In: -  Out: 425 [Urine:425]   Physical Exam:  Cardiovascular: RRR Pulmonary: Clear to auscultation bilaterally but diminished overall Wounds: Dressing intact   Chest Tube: to water seal; small, intermittent air leak that worsens with cough  Lab Results: CBC: Recent Labs    10/04/19 0452 10/05/19 0343  WBC 8.4 7.9  HGB 9.8* 9.5*  HCT 30.1* 29.7*  PLT 418* 439*   BMET:  Recent Labs    10/04/19 1237 10/05/19 0343 10/06/19 0400  NA 136 136  --   K 4.3 4.5  --   CL 98 100  --   CO2 27 25  --   GLUCOSE 110* 94  --   BUN 14 13  --   CREATININE 0.72 0.70 0.76  CALCIUM 9.2 8.9  --     PT/INR: No results for input(s): LABPROT, INR in the last 72 hours. ABG:  INR: Will add last result for INR, ABG once components are confirmed Will add last 4 CBG results once components are confirmed  Assessment/Plan:  1. CV - SR. 2.  Pulmonary - History of COPD. On 3 liters or oxygen via [place]. Chest tube with scant output last 24 hours, chest tube is to water seal and there is an intermittent air leak that increases with cough.  Check CXR    Sharalyn Ink [facility] 10/06/2019,7:24 AM    I have seen and examined the patient and agree with the assessment and plan as outlined.  Significant air leak persists and pneumothorax has increased.  Will replace tube to suction.  Will ask one of my partners to consider EBV  placement.   Rexene Alberts, MD 10/06/2019 8:23 AM

## 2019-10-06 NOTE — Progress Notes (Signed)
Spoke with Dr. Lupita Leash and Dr. Ricard Dillon. Made aware pt chest tube had been on suction with some min-moderate leak no drainage at present and clarified that was the plan d/t notes mentioning water seal vs suctioning. Informed that pt has an increase in confusion. Dr. Lupita Leash made aware that the pt has been seeing "dead people". Also that pt has a BPA on no BM for 72+ hours.

## 2019-10-06 NOTE — Progress Notes (Signed)
Nutrition Follow-up  DOCUMENTATION CODES:   Underweight  INTERVENTION:   -Boost Breeze po TID, each supplement provides 250 kcal and 9 grams of protein  -Multivitamin with minerals daily  NUTRITION DIAGNOSIS:   Increased nutrient needs related to chronic illness(COPD, underweight) as evidenced by estimated needs.  Ongoing.  GOAL:   Patient will meet greater than or equal to 90% of their needs  Progressing.  MONITOR:   PO intake, Supplement acceptance, Labs, Skin  ASSESSMENT:   79 yo female admitted with recurrent spontaneous pneumothorax. Chest tube placed 12/31. PMH includes dementia, COPD, COVID-18 Aug 2019, bladder cancer, seizures.  No PO documented since last week. Pt is drinking Boost Breeze supplement instead of Ensure supplements. Will switch orders.  Admission weight: 105 lbs. Current weight: 97 lbs.  I/Os: -2.9L since admit UOP: 425 ml x 24 hrs  Labs reviewed. Medications: Multivitamin with minerals daily  Diet Order:   Diet Order            Diet regular Room service appropriate? No; Fluid consistency: Thin  Diet effective now              EDUCATION NEEDS:   Not appropriate for education at this time  Skin:  Skin Assessment: Reviewed RN Assessment(R chest tube)  Last BM:  1/3  Height:   Ht Readings from Last 1 Encounters:  09/30/19 5\' 5"  (1.651 m)    Weight:   Wt Readings from Last 1 Encounters:  10/04/19 44.4 kg    Ideal Body Weight:  56.8 kg  BMI:  Body mass index is 16.29 kg/m.  Estimated Nutritional Needs:   Kcal:  1400-1600  Protein:  70-80 gm  Fluid:  >/= 1.4 L  Clayton Bibles, MS, RD, LDN Inpatient Clinical Dietitian Pager: [phone] After Hours Pager: [phone]

## 2019-10-06 NOTE — Progress Notes (Signed)
PROGRESS NOTE    Amanda [name]  [id number] DOB: 1940-10-08 DOA: 09/29/2019 PCP: Lemmie Evens, MD   Brief Narrative: Per HPI 79 year old female W/ severe COPD and ,ild dementia admitted w/ 3rd or c4th spontaneous PTX in past year. Recently had Covid-19 08-25-2019) and son just died from it. Had chest tube placed in R in ED 12/31 w/ improvement in dyspnea and PTX.  Patient seen by cardiothoracic surgery. Patient has chest tube in place on the right lung remains fully expanded on repeat chest x-ray 1/4. 1/6-chest tube to waterseal.  Subjective:  Resting comfortably on home oxygen setting.   Denies nausea vomiting chest pain.  Assessment & Plan:  Recurrent spontaneous pneumothorax: Chest tube with scant output 24 hours and chest tube has been on waterseal, x-ray shows pneumothorax has increased and has air leak, persistent- noted cardiothoracic surgery plan for EBV placement.   Dementia, mild: continue supportive care.    Severe emphysema/COPD/chronic hypoxic respiratory failure on 3 L cannula at home: Stable on Symbicort albuterol.  Continue pulmonary support.  Hypokalemia-resolved.     History of coronavirus a month ago, was admitted at outside facility and had a spontaneous pneumothorax that was treated with chest tube at that time.  COVID-19 PCR and AG negative on admission. Nutrition: Nutrition Problem: Increased nutrient needs Etiology: chronic illness(COPD, underweight) Signs/Symptoms: estimated needs Interventions: Ensure Enlive (each supplement provides 350kcal and 20 grams of protein), MVI  Body mass index is 16.29 kg/m.    DVT prophylaxis: lovenox Code Status: Not intubate Family Communication: plan of care discussed with patient at bedside.  Reports she is outside of town currently living with a daughter locally. I had called her daughter and updated 1/5. [facility] to call and d/w abt EBV. Disposition Plan: Remains inpatient pending further plan for chest tube given  recurrent pneumothorax and now plan for EBV.     Consultants: CT surgery Procedures: Chest tube  CXR 1/4 Chest tube on the right without evident pneumothorax. Underlying emphysematous change and fibrosis. Small right pleural effusion. No edema or consolidation is demonstrable. Stable cardiac silhouette. Carotid artery calcification noted bilaterally.  Microbiology: none Antimicrobials: Anti-infectives (From admission, onward)   None       Objective: Vitals:   10/06/19 0500 10/06/19 0759 10/06/19 0831 10/06/19 1131  BP:  (!) 111/59 (!) 112/58 (!) 104/55  Pulse: 91  90 96  Resp:  18 19 16   Temp:  98.5 F (36.9 C)  98.5 F (36.9 C)  TempSrc:  Oral  Oral  SpO2: 100%  97% 96%  Weight:      Height:        Intake/Output Summary (Last 24 hours) at 10/06/2019 1307 Last data filed at 10/06/2019 0647 Gross per 24 hour  Intake --  Output 425 ml  Net -425 ml   Filed Weights   09/29/19 1401 09/30/19 0005 10/04/19 0303  Weight: 47.6 kg 47 kg 44.4 kg   Weight change:   Body mass index is 16.29 kg/m.  Intake/Output from previous day: 01/06 0701 - 01/07 0700 In: -  Out: 425 [Urine:425] Intake/Output this shift: No intake/output data recorded.  Examination:  General exam: Alert awake oriented elderly frail on nasal cannula not in distress.   HEENT:Oral mucosa moist, Ear/Nose WNL grossly, dentition normal. Respiratory system: Bilateral air entry present, diminished, no wheezing or crackles.  Chest tube present on the right side.   Cardiovascular system: S1 & S2 +, No JVD,. Gastrointestinal system: Abdomen soft nontender nondistended.   Nervous  System: aao, non focal Extremities: No ankle edema, distal peripheral pulses palpable.  Skin: No rashes,no icterus. MSK: Normal muscle bulk,tone, power  Medications:  Scheduled Meds: . ALPRAZolam  0.5 mg Oral Daily  . docusate sodium  100 mg Oral BID  . enoxaparin (LOVENOX) injection  40 mg Subcutaneous Q24H  . feeding  supplement  1 Container Oral TID BM  . lidocaine-EPINEPHrine  20 mL Intradermal Once  . mouth rinse  15 mL Mouth Rinse BID  . mometasone-formoterol  2 puff Inhalation BID  . multivitamin with minerals  1 tablet Oral Daily  . oxyCODONE-acetaminophen  1 tablet Oral TID  . sodium chloride flush  3 mL Intravenous Q12H   Continuous Infusions: . sodium chloride      Data Reviewed: I have personally reviewed following labs and imaging studies  CBC: Recent Labs  Lab 09/29/19 1546 10/01/19 0256 10/04/19 0452 10/05/19 0343  WBC 11.1* 10.9* 8.4 7.9  NEUTROABS 9.3*  --   --   --   HGB 11.0* 9.9* 9.8* 9.5*  HCT 34.2* 30.4* 30.1* 29.7*  MCV 95.5 93.8 93.8 95.2  PLT 390 393 418* [id number]*   Basic Metabolic Panel: Recent Labs  Lab 09/29/19 1546 10/01/19 0256 10/04/19 0452 10/04/19 1237 10/05/19 0343 10/06/19 0400  NA 138 135 138 136 136  --   K 3.1* 4.3 5.4* 4.3 4.5  --   CL 98 100 101 98 100  --   CO2 26 26 25 27 25   --   GLUCOSE 124* 94 103* 110* 94  --   BUN 20 18 14 14 13   --   CREATININE 0.65 0.74 0.80 0.72 0.70 0.76  CALCIUM 9.0 8.9 9.2 9.2 8.9  --   MG  --  1.9  --   --   --   --   PHOS  --  3.3  --   --   --   --    GFR: Estimated Creatinine Clearance: 40.6 mL/min (by C-G formula based on SCr of 0.76 mg/dL). Liver Function Tests: Recent Labs  Lab 10/01/19 0256  AST 24  ALT 34  ALKPHOS 60  BILITOT 0.6  PROT 6.1*  ALBUMIN 2.1*   No results for input(s): LIPASE, AMYLASE in the last 168 hours. No results for input(s): AMMONIA in the last 168 hours. Coagulation Profile: No results for input(s): INR, PROTIME in the last 168 hours. Cardiac Enzymes: No results for input(s): CKTOTAL, CKMB, CKMBINDEX, TROPONINI in the last 168 hours. BNP (last 3 results) No results for input(s): PROBNP in the last 8760 hours. HbA1C: No results for input(s): HGBA1C in the last 72 hours. CBG: No results for input(s): GLUCAP in the last 168 hours. Lipid Profile: No results for  input(s): CHOL, HDL, LDLCALC, TRIG, CHOLHDL, LDLDIRECT in the last 72 hours. Thyroid Function Tests: No results for input(s): TSH, T4TOTAL, FREET4, T3FREE, THYROIDAB in the last 72 hours. Anemia Panel: No results for input(s): VITAMINB12, FOLATE, FERRITIN, TIBC, IRON, RETICCTPCT in the last 72 hours. Sepsis Labs: No results for input(s): PROCALCITON, LATICACIDVEN in the last 168 hours.  Recent Results (from the past 240 hour(s))  Respiratory Panel by RT PCR (Flu A&B, Covid) - Nasopharyngeal Swab     Status: None   Collection Time: 09/29/19  6:05 PM   Specimen: Nasopharyngeal Swab  Result Value Ref Range Status   SARS Coronavirus 2 by RT PCR NEGATIVE NEGATIVE Final    Comment: (NOTE) SARS-CoV-2 target nucleic acids are NOT DETECTED. The SARS-CoV-2 RNA is  generally detectable in upper respiratoy specimens during the acute phase of infection. The lowest concentration of SARS-CoV-2 viral copies this assay can detect is 131 copies/mL. A negative result does not preclude SARS-Cov-2 infection and should not be used as the sole basis for treatment or other patient management decisions. A negative result may occur with  improper specimen collection/handling, submission of specimen other than nasopharyngeal swab, presence of viral mutation(s) within the areas targeted by this assay, and inadequate number of viral copies (<131 copies/mL). A negative result must be combined with clinical observations, patient history, and epidemiological information. The expected result is Negative. Fact Sheet for Patients:  PinkCheek.be Fact Sheet for Healthcare Providers:  GravelBags.it This test is not yet ap proved or cleared by the [place] FDA and  has been authorized for detection and/or diagnosis of SARS-CoV-2 by FDA under an Emergency Use Authorization (EUA). This EUA will remain  in effect (meaning this test can be used) for the duration  of the COVID-19 declaration under Section 564(b)(1) of the Act, 21 U.S.C. section 360bbb-3(b)(1), unless the authorization is terminated or revoked sooner.    Influenza A by PCR NEGATIVE NEGATIVE Final   Influenza B by PCR NEGATIVE NEGATIVE Final    Comment: (NOTE) The Xpert Xpress SARS-CoV-2/FLU/RSV assay is intended as an aid in  the diagnosis of influenza from Nasopharyngeal swab specimens and  should not be used as a sole basis for treatment. Nasal washings and  aspirates are unacceptable for Xpert Xpress SARS-CoV-2/FLU/RSV  testing. Fact Sheet for Patients: PinkCheek.be Fact Sheet for Healthcare Providers: GravelBags.it This test is not yet approved or cleared by the [place] FDA and  has been authorized for detection and/or diagnosis of SARS-CoV-2 by  FDA under an Emergency Use Authorization (EUA). This EUA will remain  in effect (meaning this test can be used) for the duration of the  Covid-19 declaration under Section 564(b)(1) of the Act, 21  U.S.C. section 360bbb-3(b)(1), unless the authorization is  terminated or revoked. Performed at [facility], [address] [place], [place] [address]   MRSA PCR Screening     Status: None   Collection Time: 09/30/19  1:04 AM   Specimen: Nasopharyngeal  Result Value Ref Range Status   MRSA by PCR NEGATIVE NEGATIVE Final    Comment:        The GeneXpert MRSA Assay (FDA approved for NASAL specimens only), is one component of a comprehensive MRSA colonization surveillance program. It is not intended to diagnose MRSA infection nor to guide or monitor treatment for MRSA infections. Performed at [facility], [place] [address] [place], [place] [address]       Radiology Studies: DG CHEST PORT 1 VIEW  Result Date: 10/06/2019 CLINICAL DATA:  Recurrent spontaneous pneumothorax. EXAM: PORTABLE CHEST 1 VIEW COMPARISON:  10/05/2019.  06/21/2018. FINDINGS: Interim  repositioning of right chest tube, its tip is over the right mid chest. Right pneumothorax may be slightly larger on today's exam. Unchanged diffuse bilateral interstitial prominence. Cardiomegaly. No pulmonary venous congestion. Stable bilateral pleural thickening consistent scarring. Interim resolution of right pleural effusion. Tiny left pleural effusion cannot be excluded IMPRESSION: 1. Interim repositioning of right chest tube, its tip is over the right mid chest. Right pneumothorax may be slightly larger on today's exam. 2. Unchanged diffuse bilateral interstitial prominence consistent chronic interstitial lung disease. 3.  Cardiomegaly.  No pulmonary venous congestion. 4. Interim resolution of right pleural effusion. Tiny left pleural effusion cannot be excluded. Electronically Signed  By: [place]   On: 10/06/2019 08:10   DG CHEST PORT 1 VIEW  Result Date: 10/05/2019 CLINICAL DATA:  Recurrent pneumothorax. Shortness of breath. EXAM: PORTABLE CHEST 1 VIEW COMPARISON:  10/04/2019 FINDINGS: A right chest tube terminates over the lung apex, unchanged. The cardiomediastinal silhouette is unchanged with normal heart size. The lungs remain hyperinflated with unchanged diffuse interstitial densities and biapical scarring. No definite superimposed airspace consolidation is identified. There is a small right pleural effusion. No pneumothorax is identified. IMPRESSION: 1. Stable position of right chest tube without a pneumothorax identified. 2. Chronic interstitial lung disease and small right pleural effusion. Electronically Signed   By: Logan Bores M.D.   On: 10/05/2019 08:55      LOS: 7 days   Time spent: More than 50% of that time was spent in counseling and/or coordination of care.  Antonieta Pert, MD [facility]  10/06/2019, 1:07 PM

## 2019-10-07 ENCOUNTER — Inpatient Hospital Stay (HOSPITAL_COMMUNITY): Payer: Medicare Other

## 2019-10-07 LAB — COMPREHENSIVE METABOLIC PANEL
ALT: 21 U/L (ref 0–44)
AST: 23 U/L (ref 15–41)
Albumin: 2.2 g/dL — ABNORMAL LOW (ref 3.5–5.0)
Alkaline Phosphatase: 60 U/L (ref 38–126)
Anion gap: 10 (ref 5–15)
BUN: 12 mg/dL (ref 8–23)
CO2: 28 mmol/L (ref 22–32)
Calcium: 9.1 mg/dL (ref 8.9–10.3)
Chloride: 102 mmol/L (ref 98–111)
Creatinine, Ser: 0.74 mg/dL (ref 0.44–1.00)
GFR calc Af Amer: >60 mL/min (ref >60)
GFR calc non Af Amer: >60 mL/min (ref >60)
Glucose, Bld: 118 mg/dL — ABNORMAL HIGH (ref 70–99)
Potassium: 4.3 mmol/L (ref 3.5–5.1)
Sodium: 140 mmol/L (ref 135–145)
Total Bilirubin: 0.3 mg/dL (ref 0.3–1.2)
Total Protein: 6.1 g/dL — ABNORMAL LOW (ref 6.5–8.1)

## 2019-10-07 LAB — CBC
HCT: 30.6 % — ABNORMAL LOW (ref 36.0–46.0)
Hemoglobin: 9.6 g/dL — ABNORMAL LOW (ref 12.0–15.0)
MCH: 29.8 pg (ref 26.0–34.0)
MCHC: 31.4 g/dL (ref 30.0–36.0)
MCV: 95 fL (ref 80.0–100.0)
Platelets: 481 10*3/uL — ABNORMAL HIGH (ref 150–400)
RBC: 3.22 MIL/uL — ABNORMAL LOW (ref 3.87–5.11)
RDW: 14 % (ref 11.5–15.5)
WBC: 7.4 10*3/uL (ref 4.0–10.5)
nRBC: 0 % (ref 0.0–0.2)

## 2019-10-07 MED FILL — Fentanyl Citrate Preservative Free (PF) Inj 100 MCG/2ML: INTRAMUSCULAR | Qty: 2 | Status: AC

## 2019-10-07 NOTE — Progress Notes (Signed)
Physical Therapy Treatment Patient Details Name: Amanda [name] MRN: [id number] DOB: 19-Mar-1941 Today's Date: 10/07/2019    History of Present Illness Pt is a 79 y.o. female with recent COVID(+) last month, admitted 09/29/19 with worsening SOB. Pt with R-side pneumothorax s/p chest tube placement. Also with AMS; mild dementia history per primary. PMH includes multiple spontaneous pneumothorax, COPD, bladder CA. Of note, son recently passed 09/24/19 from [place].   PT Comments    Pt progressing with mobility. Able to tolerate multiple transfers and very short ambulation distance with RW and occasional minA; pt with poor activity tolerance and easily fatigued with SOB after minimal activity, requires prolonged seated rest to recover. Recommend use of w/c for home and in community. SpO2 down to 80s on 4L O2 [place], returning to >/88% with seated rest. Pt thinking people present in room that were not despite attempts to reorient.    Follow Up Recommendations  Home health PT;Supervision/Assistance - 24 hour     Equipment Recommendations  Wheelchair (measurements PT);Wheelchair cushion (measurements PT)    Recommendations for Other Services       Precautions / Restrictions Precautions Precautions: Fall;Other (comment) Precaution Comments: watch SpO2 with mobility, R chest tube Restrictions Weight Bearing Restrictions: No    Mobility  Bed Mobility Overal bed mobility: Needs Assistance Bed Mobility: Supine to Sit     Supine to sit: Supervision;HOB elevated     General bed mobility comments: Increased time, use of bed rail rolling onto L-side  Transfers Overall transfer level: Needs assistance Equipment used: Rolling walker (2 wheeled) Transfers: Sit to/from Stand Sit to Stand: Min guard;Min assist         General transfer comment: Multiple sit<>stands from bed, BSC and recliner to RW, close min guard for balance, pt initially pulling on RW despite cues requiring minA to  stabilize  Ambulation/Gait Ambulation/Gait assistance: Min guard Gait Distance (Feet): 5 Feet Assistive device: Rolling walker (2 wheeled) Gait Pattern/deviations: Step-to pattern;Trunk flexed;Narrow base of support   Gait velocity interpretation: <1.31 ft/sec, indicative of household ambulator General Gait Details: Short gait distance from bed>BSC>recliner with RW and min guard, pt requiring prolonged seated rest to recover after transfers due to fatigue and feeling SOB; additional bout of marching in place, pt only tolerating ~10 sec before needing to sit. SpO2 down to 80s on 4L O2 [place], HR up to 120s   Stairs             Wheelchair Mobility    Modified Rankin (Stroke Patients Only)       Balance Overall balance assessment: Needs assistance   Sitting balance-Leahy Scale: Fair     Standing balance support: Bilateral upper extremity supported Standing balance-Leahy Scale: Poor Standing balance comment: dependent on RW                            Cognition Arousal/Alertness: Awake/alert Behavior During Therapy: WFL for tasks assessed/performed Overall Cognitive Status: History of cognitive impairments - at baseline Area of Impairment: Orientation;Attention;Memory;Following commands;Awareness;Problem solving                 Orientation Level: Disoriented to;Place;Time;Situation Current Attention Level: Sustained;Selective Memory: Decreased short-term memory Following Commands: Follows one step commands consistently;Follows one step commands with increased time   Awareness: Intellectual Problem Solving: Slow processing;Requires verbal cues General Comments: Pt frequently thinking other people in room that were not present during session, frequent reorientation; seems to be related to some poor vision as  well. Aware she is constipated and requesting enema, also requesting first dose of ativan (per RN, recently received)      Exercises      General  Comments        Pertinent Vitals/Pain Pain Assessment: Faces Faces Pain Scale: Hurts a little bit Pain Location: R-side abdomen Pain Descriptors / Indicators: Sore Pain Intervention(s): Monitored during session    Home Living                      Prior Function            PT Goals (current goals can now be found in the care plan section) Progress towards PT goals: Progressing toward goals    Frequency           PT Plan Current plan remains appropriate    Co-evaluation              AM-PAC PT "6 Clicks" Mobility   Outcome Measure  Help needed turning from your back to your side while in a flat bed without using bedrails?: None Help needed moving from lying on your back to sitting on the side of a flat bed without using bedrails?: A Little Help needed moving to and from a bed to a chair (including a wheelchair)?: A Little Help needed standing up from a chair using your arms (e.g., wheelchair or bedside chair)?: A Little Help needed to walk in hospital room?: A Little Help needed climbing 3-5 steps with a railing? : A Lot 6 Click Score: 18    End of Session Equipment Utilized During Treatment: Oxygen Activity Tolerance: Patient tolerated treatment well;Patient limited by fatigue Patient left: in chair;with call bell/phone within reach(chair alarm malfunction, NT aware) Nurse Communication: Mobility status PT Visit Diagnosis: Muscle weakness (generalized) (M62.81);Difficulty in walking, not elsewhere classified (R26.2)     Time: [id number] PT Time Calculation (min) (ACUTE ONLY): 30 min  Charges:  $Therapeutic Activity: 23-37 mins                    Mabeline Caras, PT, DPT Acute Rehabilitation Services  Pager [phone] Office [place] 10/07/2019, 11:32 AM

## 2019-10-07 NOTE — Progress Notes (Signed)
      [address]       [place] [address]             [phone]      Asked to see patient by Dr. Roxy Manns due to persistent air leak  79 yo woman with severe emphysema who presented with a recurrent spontaneous pneumothorax. Pigtail catheter placed with good reexpansion but she has had a persistent air leak and failed going to water seal. She is a poor candidate for VATS, blebectomy due to age, co-morbidities and severity of pulmonary disease.   I offered her the option of off label placement of intrabronchial valves as an alternative that is far less risky. I described the procedure to her. She understands we would do it in the OR under general anesthesia, but there are no incisions. She understands the indications, risks, benefits and alternatives. She understands the risks include but are not limited to death, MI, blood clots, stroke, infection. She understands we would make decision re: removal after we see how she does with them.   She understands there is no guarantee the leak will resolve, but this may be beneficial.  She will think about about it and talk to her siblings. If she decides to proceed will place IBV on Monday  Remo Lipps C. Roxan Hockey, MD [facility] [phone]

## 2019-10-07 NOTE — Progress Notes (Signed)
PROGRESS NOTE    Amanda [name]  [id number] DOB: April 09, 1941 DOA: 09/29/2019 PCP: Lemmie Evens, MD   Brief Narrative: Per HPI 79 year old female W/ severe COPD and ,ild dementia admitted w/ 3rd or c4th spontaneous PTX in past year. Recently had Covid-19 09/05/19) and son just died from it. Had chest tube placed in R in ED 12/31 w/ improvement in dyspnea and PTX.  Patient seen by cardiothoracic surgery. Patient has chest tube in place on the right lung remains fully expanded on repeat chest x-ray 1/4. 1/6-chest tube to waterseal. 1/7-increased pneumothorax on x-ray and chest tube back on suction  Subjective: Resting comfortably in the bedside chair.  Alert awake.  Reports sometimes she is seeing people in the room Calm and is not agitated.  Work-up shows no infection.  Assessment & Plan:  Recurrent spontaneous pneumothorax: Chest tube with scant output 24 hours and chest tube has been on waterseal, x-ray shows pneumothorax has increased and has air leak, persistent chest tube placed back on suction.  Still with a small air leak, chest x-ray this a.m. shows lesser right pneumothorax.  CTS Dr. Roxy Manns following ?EBV placement.   Dementia, mild with intermittent confusion/hallucination: continue supportive care.  TSH ammonia B12 level stable.  UA without infection, no fever.  Likely sundowning.  Patient has been seeing people in the room.  But this morning when asked questions he is alert awake oriented to month year current president, place.  Severe emphysema/COPD/chronic hypoxic respiratory failure on 3 L cannula at home: Stable on Symbicort albuterol.  Continue pulmonary support.  Hypokalemia-resolved.     Anemia chronic likely from chronic disease.  Hemoglobin stable at 9.6 g.  History of coronavirus a month ago, was admitted at outside facility and had a spontaneous pneumothorax that was treated with chest tube at that time.  COVID-19 PCR and AG negative on admission.    Nutrition: Nutrition Problem: Increased nutrient needs Etiology: chronic illness(COPD, underweight) Signs/Symptoms: estimated needs Interventions: Ensure Enlive (each supplement provides 350kcal and 20 grams of protein), MVI  Body mass index is 16.29 kg/m.    DVT prophylaxis: lovenox Code Status: Not intubation Family Communication: plan of care discussed with patient at bedside.  Reports she is outside of town currently living with a daughter locally. I had called her daughter and updated previously at this time -no new changes, will update daughter.  TCTS to d/w abt EBV. Disposition Plan: Remains inpatient pending further plan for chest tube given recurrent pneumothorax and now plan for EBV.     Consultants: CT surgery Procedures: Chest tube  CXR 1/4 Chest tube on the right without evident pneumothorax. Underlying emphysematous change and fibrosis. Small right pleural effusion. No edema or consolidation is demonstrable. Stable cardiac silhouette. Carotid artery calcification noted bilaterally.  Microbiology: none Antimicrobials: Anti-infectives (From admission, onward)   None       Objective: Vitals:   10/07/19 0329 10/07/19 0700 10/07/19 0834 10/07/19 1219  BP: (!) 104/55 (!) 100/55    Pulse: 88 91    Resp: (!) 21 20    Temp: 98 F (36.7 C)  98.5 F (36.9 C) 97.8 F (36.6 C)  TempSrc:   Oral Oral  SpO2: 98% 99%    Weight:      Height:        Intake/Output Summary (Last 24 hours) at 10/07/2019 1303 Last data filed at 10/06/2019 1536 Gross per 24 hour  Intake -  Output 0 ml  Net 0 ml   [employer]  09/29/19 1401 09/30/19 0005 10/04/19 0303  Weight: 47.6 kg 47 kg 44.4 kg   Weight change:   Body mass index is 16.29 kg/m.  Intake/Output from previous day: No intake/output data recorded. Intake/Output this shift: No intake/output data recorded.  Examination:  General exam: Alert awake elderly, frail on nasal cannula on bedside chair.  HEENT:Oral  mucosa moist, Ear/Nose WNL grossly, dentition normal. Respiratory system: Bilateral air entry present without wheezing or crackles, chest tube present on the right.   Cardiovascular system: S1 & S2 +, No JVD,. Gastrointestinal system: Abdomen soft nontender nondistended.   Nervous System:  alert awake oriented to month year current president, place. non focal Extremities: No ankle edema, distal peripheral pulses palpable.  Skin: No rashes,no icterus. MSK: Normal muscle bulk,tone, power  Medications:  Scheduled Meds: . ALPRAZolam  0.5 mg Oral Daily  . docusate sodium  100 mg Oral BID  . enoxaparin (LOVENOX) injection  40 mg Subcutaneous Q24H  . feeding supplement  1 Container Oral TID BM  . lidocaine-EPINEPHrine  20 mL Intradermal Once  . mouth rinse  15 mL Mouth Rinse BID  . mometasone-formoterol  2 puff Inhalation BID  . multivitamin with minerals  1 tablet Oral Daily  . senna  1 tablet Oral Daily  . sodium chloride flush  3 mL Intravenous Q12H   Continuous Infusions: . sodium chloride      Data Reviewed: I have personally reviewed following labs and imaging studies  CBC: Recent Labs  Lab 10/01/19 0256 10/04/19 0452 10/05/19 0343 10/07/19 0253  WBC 10.9* 8.4 7.9 7.4  HGB 9.9* 9.8* 9.5* 9.6*  HCT 30.4* 30.1* 29.7* 30.6*  MCV 93.8 93.8 95.2 95.0  PLT 393 418* 439* [id number]*   Basic Metabolic Panel: Recent Labs  Lab 10/01/19 0256 10/04/19 0452 10/04/19 1237 10/05/19 0343 10/06/19 0400 10/07/19 0253  NA 135 138 136 136  --  140  K 4.3 5.4* 4.3 4.5  --  4.3  CL 100 101 98 100  --  102  CO2 26 25 27 25   --  28  GLUCOSE 94 103* 110* 94  --  118*  BUN 18 14 14 13   --  12  CREATININE 0.74 0.80 0.72 0.70 0.76 0.74  CALCIUM 8.9 9.2 9.2 8.9  --  9.1  MG 1.9  --   --   --   --   --   PHOS 3.3  --   --   --   --   --    GFR: Estimated Creatinine Clearance: 40.6 mL/min (by C-G formula based on SCr of 0.74 mg/dL). Liver Function Tests: Recent Labs  Lab 10/01/19 0256  10/07/19 0253  AST 24 23  ALT 34 21  ALKPHOS 60 60  BILITOT 0.6 0.3  PROT 6.1* 6.1*  ALBUMIN 2.1* 2.2*   No results for input(s): LIPASE, AMYLASE in the last 168 hours. Recent Labs  Lab 10/06/19 1701  AMMONIA 16   Coagulation Profile: No results for input(s): INR, PROTIME in the last 168 hours. Cardiac Enzymes: No results for input(s): CKTOTAL, CKMB, CKMBINDEX, TROPONINI in the last 168 hours. BNP (last 3 results) No results for input(s): PROBNP in the last 8760 hours. HbA1C: No results for input(s): HGBA1C in the last 72 hours. CBG: No results for input(s): GLUCAP in the last 168 hours. Lipid Profile: No results for input(s): CHOL, HDL, LDLCALC, TRIG, CHOLHDL, LDLDIRECT in the last 72 hours. Thyroid Function Tests: Recent Labs    10/06/19 1701  TSH  0.635   Anemia Panel: Recent Labs    10/06/19 1701  VITAMINB12 280   Sepsis Labs: No results for input(s): PROCALCITON, LATICACIDVEN in the last 168 hours.  Recent Results (from the past 240 hour(s))  Respiratory Panel by RT PCR (Flu A&B, Covid) - Nasopharyngeal Swab     Status: None   Collection Time: 09/29/19  6:05 PM   Specimen: Nasopharyngeal Swab  Result Value Ref Range Status   SARS Coronavirus 2 by RT PCR NEGATIVE NEGATIVE Final    Comment: (NOTE) SARS-CoV-2 target nucleic acids are NOT DETECTED. The SARS-CoV-2 RNA is generally detectable in upper respiratoy specimens during the acute phase of infection. The lowest concentration of SARS-CoV-2 viral copies this assay can detect is 131 copies/mL. A negative result does not preclude SARS-Cov-2 infection and should not be used as the sole basis for treatment or other patient management decisions. A negative result may occur with  improper specimen collection/handling, submission of specimen other than nasopharyngeal swab, presence of viral mutation(s) within the areas targeted by this assay, and inadequate number of viral copies (<131 copies/mL). A negative  result must be combined with clinical observations, patient history, and epidemiological information. The expected result is Negative. Fact Sheet for Patients:  PinkCheek.be Fact Sheet for Healthcare Providers:  GravelBags.it This test is not yet ap proved or cleared by the [place] FDA and  has been authorized for detection and/or diagnosis of SARS-CoV-2 by FDA under an Emergency Use Authorization (EUA). This EUA will remain  in effect (meaning this test can be used) for the duration of the COVID-19 declaration under Section 564(b)(1) of the Act, 21 U.S.C. section 360bbb-3(b)(1), unless the authorization is terminated or revoked sooner.    Influenza A by PCR NEGATIVE NEGATIVE Final   Influenza B by PCR NEGATIVE NEGATIVE Final    Comment: (NOTE) The Xpert Xpress SARS-CoV-2/FLU/RSV assay is intended as an aid in  the diagnosis of influenza from Nasopharyngeal swab specimens and  should not be used as a sole basis for treatment. Nasal washings and  aspirates are unacceptable for Xpert Xpress SARS-CoV-2/FLU/RSV  testing. Fact Sheet for Patients: PinkCheek.be Fact Sheet for Healthcare Providers: GravelBags.it This test is not yet approved or cleared by the [place] FDA and  has been authorized for detection and/or diagnosis of SARS-CoV-2 by  FDA under an Emergency Use Authorization (EUA). This EUA will remain  in effect (meaning this test can be used) for the duration of the  Covid-19 declaration under Section 564(b)(1) of the Act, 21  U.S.C. section 360bbb-3(b)(1), unless the authorization is  terminated or revoked. Performed at [facility], [address] [place], [place] [address]   MRSA PCR Screening     Status: None   Collection Time: 09/30/19  1:04 AM   Specimen: Nasopharyngeal  Result Value Ref Range Status   MRSA by PCR NEGATIVE NEGATIVE Final     Comment:        The GeneXpert MRSA Assay (FDA approved for NASAL specimens only), is one component of a comprehensive MRSA colonization surveillance program. It is not intended to diagnose MRSA infection nor to guide or monitor treatment for MRSA infections. Performed at [facility], [place] [address], [place], [place] [address]       Radiology Studies: DG CHEST PORT 1 VIEW  Result Date: 10/07/2019 CLINICAL DATA:  Chest tube. EXAM: PORTABLE CHEST 1 VIEW COMPARISON:  10/06/2019.  10/05/2019.  06/25/2018. FINDINGS: Right chest tube in stable position. Right pneumothorax has improved  from prior exam with mild residual. Chronic bilateral interstitial changes are again noted. Superimposed active interstitial process cannot be excluded. Stable bilateral pleural thickening. Heart size stable. IMPRESSION: 1. Right chest tube in stable position. Right pneumothorax is improved from prior exam with mild residual. 2. Chronic bilateral interstitial changes are again noted. Superimposed active interstitial process cannot be excluded. No interim change from prior exam. Electronically Signed   By: Marcello Moores  Register   On: 10/07/2019 06:33   DG CHEST PORT 1 VIEW  Result Date: 10/06/2019 CLINICAL DATA:  Recurrent spontaneous pneumothorax. EXAM: PORTABLE CHEST 1 VIEW COMPARISON:  10/05/2019.  06/21/2018. FINDINGS: Interim repositioning of right chest tube, its tip is over the right mid chest. Right pneumothorax may be slightly larger on today's exam. Unchanged diffuse bilateral interstitial prominence. Cardiomegaly. No pulmonary venous congestion. Stable bilateral pleural thickening consistent scarring. Interim resolution of right pleural effusion. Tiny left pleural effusion cannot be excluded IMPRESSION: 1. Interim repositioning of right chest tube, its tip is over the right mid chest. Right pneumothorax may be slightly larger on today's exam. 2. Unchanged diffuse bilateral interstitial prominence consistent  chronic interstitial lung disease. 3.  Cardiomegaly.  No pulmonary venous congestion. 4. Interim resolution of right pleural effusion. Tiny left pleural effusion cannot be excluded. Electronically Signed   By: Marcello Moores  Register   On: 10/06/2019 08:10      LOS: 8 days   Time spent: More than 50% of that time was spent in counseling and/or coordination of care.  Antonieta Pert, MD [facility]  10/07/2019, 1:03 PM

## 2019-10-07 NOTE — Progress Notes (Addendum)
      [address]       [place] [address]             [phone]           Subjective: Patient had confusion yesterday afternoon. She states she is not at her house but when asked if in hospital she stated "no".  Objective: Vital signs in last 24 hours: Temp:  [97.8 F (36.6 C)-98.5 F (36.9 C)] (P) 98 F (36.7 C) (01/08 0329) Pulse Rate:  [88-96] (P) 88 (01/08 0329) Cardiac Rhythm: Normal sinus rhythm (01/07 1901) Resp:  [16-21] (P) 21 (01/08 0329) BP: (101-112)/(55-59) (P) 104/55 (01/08 0329) SpO2:  [95 %-98 %] (P) 98 % (01/08 0329)     Intake/Output from previous day: No intake/output data recorded.   Physical Exam:  Cardiovascular: RRR Pulmonary: Clear to auscultation bilaterally but diminished overall Wounds: Dressing intact   Chest Tube: to 20 cm suction, small air leak   Lab Results: CBC: Recent Labs    10/05/19 0343 10/07/19 0253  WBC 7.9 7.4  HGB 9.5* 9.6*  HCT 29.7* 30.6*  PLT 439* 481*   BMET:  Recent Labs    10/05/19 0343 10/06/19 0400 10/07/19 0253  NA 136  --  140  K 4.5  --  4.3  CL 100  --  102  CO2 25  --  28  GLUCOSE 94  --  118*  BUN 13  --  12  CREATININE 0.70 0.76 0.74  CALCIUM 8.9  --  9.1    PT/INR: No results for input(s): LABPROT, INR in the last 72 hours. ABG:  INR: Will add last result for INR, ABG once components are confirmed Will add last 4 CBG results once components are confirmed  Assessment/Plan:  1. CV - SR. 2.  Pulmonary - History of COPD. On 4 liters or oxygen via [place]. Chest tube with scant output last 24 hours and has persistent, small air leak. Chest tube placed back to 20 cm uction yesterday as developed a right pneumothorax after put to water seal. CXR this am shows lesser right pneumothorax. Will discuss with Dr. Roxy Manns if planning on EBVs next week 3. Mental status-mild dementia history, per primary  Donielle M ZimmermanPA-C 10/07/2019,7:47 AM    I have seen and examined the  patient and agree with the assessment and plan as outlined.  Continue tube to suction for now.  Rexene Alberts, MD 10/07/2019 9:26 AM  ]

## 2019-10-08 ENCOUNTER — Inpatient Hospital Stay (HOSPITAL_COMMUNITY): Payer: Medicare Other

## 2019-10-08 ENCOUNTER — Encounter (HOSPITAL_COMMUNITY): Payer: Self-pay | Admitting: Internal Medicine

## 2019-10-08 LAB — PROTIME-INR
INR: 1 (ref 0.8–1.2)
Prothrombin Time: 13.5 seconds (ref 11.4–15.2)

## 2019-10-08 MED ORDER — TRAMADOL HCL 50 MG PO TABS
50.0000 mg | ORAL_TABLET | Freq: Three times a day (TID) | ORAL | Status: DC | PRN
  Administered 2019-10-08 – 2019-10-09 (×4): 50 mg via ORAL
  Filled 2019-10-08 (×4): qty 1

## 2019-10-08 MED ORDER — FENTANYL CITRATE (PF) 100 MCG/2ML IJ SOLN
INTRAMUSCULAR | Status: AC | PRN
  Administered 2019-10-08 (×2): 25 ug via INTRAVENOUS

## 2019-10-08 MED ORDER — MIDAZOLAM HCL 2 MG/2ML IJ SOLN
INTRAMUSCULAR | Status: AC | PRN
  Administered 2019-10-08 (×2): 0.5 mg via INTRAVENOUS

## 2019-10-08 MED ORDER — LIDOCAINE HCL 1 % IJ SOLN
INTRAMUSCULAR | Status: AC
  Filled 2019-10-08: qty 20

## 2019-10-08 MED ORDER — MIDAZOLAM HCL 2 MG/2ML IJ SOLN
INTRAMUSCULAR | Status: AC
  Filled 2019-10-08: qty 2

## 2019-10-08 MED ORDER — FENTANYL CITRATE (PF) 100 MCG/2ML IJ SOLN
INTRAMUSCULAR | Status: AC
  Filled 2019-10-08: qty 2

## 2019-10-08 NOTE — Procedures (Signed)
Interventional Radiology Procedure:   Indications: Right chest tube was dislodged and residual small pneumothorax  Procedure: CT guided right chest tube placement  Findings: Right apical pneumothorax.  Placed 14 Fr drain and near resolution of pneumothorax.  Complications: None     EBL: Minimal, less than 10 ml  Plan: CXR today.  Wall suction.  Tube management per CT surgery.    Juell Radney R. Anselm Pancoast, MD  Pager: [phone]

## 2019-10-08 NOTE — Consult Note (Signed)
Chief Complaint: Patient was seen in consultation today for right chest tube placement Chief Complaint  Patient presents with  . Shortness of Breath    Referring Physician(s): Mellette  Supervising Physician: Markus Daft  Patient Status: [facility] - In-pt  History of Present Illness: Amanda [name] is a 79 y.o. female with history of bladder cancer, severe COPD, COVID-19 in November 2020 and some mild dementia who was admitted recently with spontaneous right pneumothorax.  Initially had chest tube placed in ED on 12/31.  She has since had issues with persistent air leak and failed going to waterseal.  She is a poor candidate for VATS and blebectomy due to age and comorbidities.  This morning right chest tube was found dislodged and request now received from to TCTS for chest tube replacement.  Latest chest x-ray shows some interval enlargement of small right pneumothorax.  She currently denies fever, headache, chest pain, worsening dyspnea, cough, abdominal/back pain, nausea, vomiting or bleeding.  She is anxious.  Past Medical History:  Diagnosis Date  . Arthritis   . Bladder cancer ([place])   . Blood transfusion without reported diagnosis   . COPD (chronic obstructive pulmonary disease) ([place])   . COVID-19 07/2019  . Renal disorder    kidney stones  . Seizures ([place])   . Spontaneous pneumothorax - recurrent 05/2018   right    Past Surgical History:  Procedure Laterality Date  . ABDOMINAL HYSTERECTOMY    . CYSTECTOMY W/ URETEROILEAL CONDUIT  2009    Allergies: Ciprofloxacin, Lactose, Levofloxacin, Sulfa antibiotics, Sulfamethoxazole, Aspirin, Cephalexin, Clindamycin, Erythromycin base, Hydromorphone, and Penicillins  Medications: Prior to Admission medications   Medication Sig Start Date End Date Taking? Authorizing Provider  acetaminophen (TYLENOL) 500 MG tablet Take 500-1,000 mg by mouth daily as needed for mild pain or moderate pain.    Yes [provider]    albuterol (PROVENTIL HFA;VENTOLIN HFA) 108 (90 Base) MCG/ACT inhaler Inhale 1-2 puffs into the lungs every 6 (six) hours as needed for wheezing or shortness of breath. 09/29/18  Yes Hayden Rasmussen, MD  ALPRAZolam Duanne Moron) 0.5 MG tablet Take 0.5 mg by mouth daily.    Yes [provider]  Cholecalciferol (VITAMIN D-1000 MAX ST) 1000 units tablet Take 1,000 Units by mouth daily.    Yes [provider]  Multiple Vitamin (MULTIVITAMIN) capsule Take 1 capsule by mouth daily.    Yes [provider]  oxyCODONE-acetaminophen (PERCOCET/ROXICET) 5-325 MG tablet Take 1 tablet by mouth 3 (three) times daily. 09/05/19  Yes [provider]  SYMBICORT 80-4.5 MCG/ACT inhaler Inhale 2 puffs into the lungs 2 (two) times daily. 09/05/19  Yes [provider]  vitamin C (ASCORBIC ACID) 250 MG tablet Take 500 mg by mouth daily.    Yes [provider]     History reviewed. No pertinent family history.  Social History   Socioeconomic History  . Marital status: Widowed    Spouse name: Not on file  . Number of children: Not on file  . Years of education: Not on file  . Highest education level: Not on file  Occupational History  . Not on file  Tobacco Use  . Smoking status: Current Some Day Smoker    Types: Cigarettes  . Smokeless tobacco: Never Used  Substance and Sexual Activity  . Alcohol use: Never  . Drug use: Never  . Sexual activity: Not Currently  Other Topics Concern  . Not on file  Social History Narrative   married >  10 yrs, widowed. She has 1 daughter (contact person), 2 sons, 4 grand-daughters. One of her sons died at age 83 in the fall January 06, 2019 from Covid 19 PNA. 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    Social Determinants of Health   Financial Resource Strain:   . Difficulty of Paying Living Expenses: Not on file  Food Insecurity:   . Worried About Charity fundraiser in the Last Year: Not on file  . Ran Out of  Food in the Last Year: Not on file  Transportation Needs:   . Lack of Transportation (Medical): Not on file  . Lack of Transportation (Non-Medical): Not on file  Physical Activity:   . Days of Exercise per Week: Not on file  . Minutes of Exercise per Session: Not on file  Stress:   . Feeling of Stress : Not on file  Social Connections:   . Frequency of Communication with Friends and Family: Not on file  . Frequency of Social Gatherings with Friends and Family: Not on file  . Attends Religious Services: Not on file  . Active Member of Clubs or Organizations: Not on file  . Attends Archivist Meetings: Not on file  . Marital Status: Not on file      Review of Systems see above  Vital Signs: BP (!) 105/56 (BP Location: Right Arm)   Pulse 92   Temp 98.1 F (36.7 C) (Oral)   Resp 18   Ht 5\' 5"  (1.651 m)   Wt 97 lb 14.2 oz (44.4 kg)   SpO2 100%   BMI 16.29 kg/m   Physical Exam patient awake, alert.  Chest with distant breath sounds bilaterally.  Heart with regular rate and rhythm.  Abdomen soft, positive bowel sounds, nontender, colostomy in place.  No lower extremity edema.  Imaging: DG CHEST PORT 1 VIEW  Result Date: 10/08/2019 CLINICAL DATA:  Pneumothorax. EXAM: PORTABLE CHEST 1 VIEW COMPARISON:  10/07/2019 FINDINGS: The cardiomediastinal silhouette is unchanged with normal heart size. The lungs remain hyperinflated with chronic widespread interstitial opacities and asymmetric left apical pleuroparenchymal scarring. The right chest tube has been removed, and a small right pneumothorax has enlarged. There is unchanged blunting of the right costophrenic angle which may represent a trace pleural effusion. IMPRESSION: Interval enlargement of small right pneumothorax following chest tube removal. Electronically Signed   By: Logan Bores M.D.   On: 10/08/2019 10:04   DG CHEST PORT 1 VIEW  Result Date: 10/07/2019 CLINICAL DATA:  Chest tube. EXAM: PORTABLE CHEST 1 VIEW  COMPARISON:  10/06/2019.  10/05/2019.  06/25/2018. FINDINGS: Right chest tube in stable position. Right pneumothorax has improved from prior exam with mild residual. Chronic bilateral interstitial changes are again noted. Superimposed active interstitial process cannot be excluded. Stable bilateral pleural thickening. Heart size stable. IMPRESSION: 1. Right chest tube in stable position. Right pneumothorax is improved from prior exam with mild residual. 2. Chronic bilateral interstitial changes are again noted. Superimposed active interstitial process cannot be excluded. No interim change from prior exam. Electronically Signed   By: Marcello Moores  Register   On: 10/07/2019 06:33   DG CHEST PORT 1 VIEW  Result Date: 10/06/2019 CLINICAL DATA:  Recurrent spontaneous pneumothorax. EXAM: PORTABLE CHEST 1 VIEW COMPARISON:  10/05/2019.  06/21/2018. FINDINGS: Interim repositioning of right chest tube, its tip is over the right mid chest. Right pneumothorax may be slightly larger on today's exam. Unchanged diffuse bilateral interstitial prominence. Cardiomegaly. No pulmonary venous  congestion. Stable bilateral pleural thickening consistent scarring. Interim resolution of right pleural effusion. Tiny left pleural effusion cannot be excluded IMPRESSION: 1. Interim repositioning of right chest tube, its tip is over the right mid chest. Right pneumothorax may be slightly larger on today's exam. 2. Unchanged diffuse bilateral interstitial prominence consistent chronic interstitial lung disease. 3.  Cardiomegaly.  No pulmonary venous congestion. 4. Interim resolution of right pleural effusion. Tiny left pleural effusion cannot be excluded. Electronically Signed   By: Marcello Moores  Register   On: 10/06/2019 08:10   DG CHEST PORT 1 VIEW  Result Date: 10/05/2019 CLINICAL DATA:  Recurrent pneumothorax. Shortness of breath. EXAM: PORTABLE CHEST 1 VIEW COMPARISON:  10/04/2019 FINDINGS: A right chest tube terminates over the lung apex,  unchanged. The cardiomediastinal silhouette is unchanged with normal heart size. The lungs remain hyperinflated with unchanged diffuse interstitial densities and biapical scarring. No definite superimposed airspace consolidation is identified. There is a small right pleural effusion. No pneumothorax is identified. IMPRESSION: 1. Stable position of right chest tube without a pneumothorax identified. 2. Chronic interstitial lung disease and small right pleural effusion. Electronically Signed   By: Logan Bores M.D.   On: 10/05/2019 08:55   DG CHEST PORT 1 VIEW  Result Date: 10/04/2019 CLINICAL DATA:  Shortness of breath.  Follow-up pneumothorax. EXAM: PORTABLE CHEST 1 VIEW COMPARISON:  10/03/2019.  06/22/2018.  CT 06/22/2018. FINDINGS: Right chest tube in stable position. No pneumothorax noted. Heart size stable. Stable changes of chronic interstitial lung disease. Stable bilateral pleural-parenchymal thickening consistent with scarring. IMPRESSION: 1.  Right chest tube in stable position.  No pneumothorax. 2. Stable chronic interstitial lung disease and bilateral pleural-parenchymal scarring. Electronically Signed   By: Marcello Moores  Register   On: 10/04/2019 07:52   DG CHEST PORT 1 VIEW  Result Date: 10/03/2019 CLINICAL DATA:  Recent pneumothorax.  History of bladder carcinoma EXAM: PORTABLE CHEST 1 VIEW COMPARISON:  October 02, 2019 and August 30, 2019 FINDINGS: Chest tube remains on the right with the tip in the right apex region. Currently there is no pneumothorax appreciable. There is underlying fibrosis throughout the lungs. There is a small right pleural effusion. There is no frank edema or consolidation. The heart size is normal. Pulmonary vascularity reflects underlying emphysematous changes and is stable. There is aortic atherosclerosis. No adenopathy evident. Calcification is noted in each carotid artery. IMPRESSION: Chest tube on the right without evident pneumothorax. Underlying emphysematous change and  fibrosis. Small right pleural effusion. No edema or consolidation is demonstrable. Stable cardiac silhouette. Carotid artery calcification noted bilaterally. Aortic Atherosclerosis (ICD10-I70.0) and Emphysema (ICD10-J43.9). Electronically Signed   By: Lowella Grip III M.D.   On: 10/03/2019 07:35   DG CHEST PORT 1 VIEW  Result Date: 10/02/2019 CLINICAL DATA:  Follow-up pneumothorax EXAM: PORTABLE CHEST 1 VIEW COMPARISON:  Chest radiograph 10/01/2019 FINDINGS: Stable cardiomediastinal contours. Right pigtail catheter remains positioned towards the right apex. No definite pneumothorax. The lungs are hyperinflated. There are diffuse coarsened interstitial markings compatible with advanced chronic bronchitis/emphysema. Compared to prior radiographs there appear to be diffusely increased interstitial markings throughout the left greater than right lungs concerning for superimposed edema or infection. Trace right pleural fluid. IMPRESSION: 1. No definite pneumothorax with right chest tube in place. 2. Stable appearance of the lungs with findings of advanced COPD/chronic bronchitis and concern for superimposed edema or infection given increase in interstitial markings compared to prior radiographs. Electronically Signed   By: Audie Pinto M.D.   On: 10/02/2019 10:14  DG CHEST PORT 1 VIEW  Result Date: 10/01/2019 CLINICAL DATA:  Follow-up pneumothorax. EXAM: PORTABLE CHEST 1 VIEW COMPARISON:  09/30/2019 FINDINGS: Right-sided chest tube is again noted with pigtail overlying the right apex. Right-sided pneumothorax is no longer visualized. Small bilateral pleural effusions. The lungs are hyperinflated and there are diffuse coarsened interstitial marking throughout both lungs compatible with advanced COPD/emphysema. Increased interstitial opacities throughout both lungs concerning for either mild pulmonary edema or atypical infection IMPRESSION: 1. Right-sided pneumothorax is no longer visualized. 2. Small  bilateral pleural effusions. 3. Superimposed increase interstitial opacities which may represent atypical infection versus edema. Electronically Signed   By: Kerby Moors M.D.   On: 10/01/2019 09:10   Portable chest 1 View  Result Date: 09/30/2019 CLINICAL DATA:  Recurrent spontaneous pneumothorax with chest tube in place. EXAM: PORTABLE CHEST 1 VIEW COMPARISON:  09/29/2019 FINDINGS: Right chest tube in place. a small residual right-sided pneumothorax is identified. This appears increased in volume compared with 09/29/2019 at 3:55 p.m. Hyperinflation with chronic interstitial changes noted consistent with advanced COPD/emphysema. Diffuse interstitial thickening is noted bilaterally, unchanged. IMPRESSION: 1. Small residual right-sided pneumothorax. This appears increased in volume compared with 09/29/19 at 3:55 p.m. 2. Advanced changes of COPD/emphysema with superimposed increase interstitial markings bilaterally. Electronically Signed   By: Kerby Moors M.D.   On: 09/30/2019 09:44   DG Chest Portable 1 View  Result Date: 09/29/2019 CLINICAL DATA:  COPD. Tube placement. Recent COVID-19 with collapse lung. EXAM: PORTABLE CHEST 1 VIEW COMPARISON:  Earlier today at 2:20 p.m. FINDINGS: 3:55 p.m. Placement of a right-sided chest tube. Midline trachea. Normal heart size. Atherosclerosis in the transverse aorta. Numerous leads and wires project over the chest. Probable trace right pleural fluid. No residual pneumothorax. Diffuse interstitial thickening. Similar bibasilar airspace disease. Left apical pleuroparenchymal scarring. IMPRESSION: Re-expansion of the right lung after chest tube placement. Diffuse interstitial thickening with similar bibasilar airspace disease, most likely atelectasis. Aortic Atherosclerosis (ICD10-I70.0). Electronically Signed   By: Abigail Miyamoto M.D.   On: 09/29/2019 16:07   DG Chest Portable 1 View  Result Date: 09/29/2019 CLINICAL DATA:  History of COVID on Thanksgiving. COPD.  Wheezing in the right lung. EXAM: PORTABLE CHEST 1 VIEW COMPARISON:  Chest radiograph 08/30/2019 FINDINGS: Stable cardiomediastinal contours. There is a large right pneumothorax with compressive atelectasis of the right lung. Diffuse bilateral coarse interstitial markings. Lungs are hyperinflated. Biapical pleuroparenchymal scarring. No acute findings in the visualized skeleton. IMPRESSION: 1. Large right pneumothorax with compressive atelectasis of the right lung. Probable fracture in a right lower anterolateral rib. 2. Chronic changes consistent with COPD and chronic bronchitis. These results were called by telephone at the time of interpretation on 09/29/2019 at 2:44 pm to provider Fredia Sorrow , who verbally acknowledged these results. Electronically Signed   By: Audie Pinto M.D.   On: 09/29/2019 14:47    Labs:  CBC: Recent Labs    10/01/19 0256 10/04/19 0452 10/05/19 0343 10/07/19 0253  WBC 10.9* 8.4 7.9 7.4  HGB 9.9* 9.8* 9.5* 9.6*  HCT 30.4* 30.1* 29.7* 30.6*  PLT 393 418* 439* 481*    COAGS: Recent Labs    10/08/19 1237  INR 1.0    BMP: Recent Labs    10/04/19 0452 10/04/19 1237 10/05/19 0343 10/06/19 0400 10/07/19 0253  NA 138 136 136  --  140  K 5.4* 4.3 4.5  --  4.3  CL 101 98 100  --  102  CO2 25 27 25   --  28  GLUCOSE 103* 110* 94  --  118*  BUN 14 14 13   --  12  CALCIUM 9.2 9.2 8.9  --  9.1  CREATININE 0.80 0.72 0.70 0.76 0.74  GFRNONAA >60 >60 >60 >60 >60  GFRAA >60 >60 >60 >60 >60    LIVER FUNCTION TESTS: Recent Labs    10/01/19 0256 10/07/19 0253  BILITOT 0.6 0.3  AST 24 23  ALT 34 21  ALKPHOS 60 60  PROT 6.1* 6.1*  ALBUMIN 2.1* 2.2*    TUMOR MARKERS: No results for input(s): AFPTM, CEA, CA199, CHROMGRNA in the last 8760 hours.  Assessment and Plan: 79 y.o. female with history of bladder cancer, severe COPD, COVID-19 in November 2020 and some mild dementia who was admitted recently with spontaneous right pneumothorax.  Initially  had chest tube placed in ED on 12/31.  She has since had issues with persistent air leak and failed going to waterseal.  She is a poor candidate for VATS and blebectomy due to age and comorbidities.  This morning right chest tube was found dislodged and request now received from to TCTS for chest tube replacement.  Latest chest x-ray shows some interval enlargement of small right pneumothorax.  Case has been reviewed by Dr. Anselm Pancoast.  Details/risks of chest tube placement, including but not limited to, internal bleeding, infection, persistent pneumothorax discussed with patient with her understanding and consent. Procedure tent scheduled for later today.   Thank you for this interesting consult.  I greatly enjoyed meeting Amanda [name] and look forward to participating in their care.  A copy of this report was sent to the requesting provider on this date.  Electronically Signed: D. Rowe Robert, PA-C 10/08/2019, 1:24 PM   I spent a total of 25 minutes    in face to face in clinical consultation, greater than 50% of which was counseling/coordinating care for right chest tube placement

## 2019-10-08 NOTE — Progress Notes (Addendum)
      [address]       [place] [address]             [phone]           Subjective: I asked patient if she pulled out chest tube and she said no;however, it was lying beside her in the bed.  Objective: Vital signs in last 24 hours: Temp:  [97.5 F (36.4 C)-98 F (36.7 C)] 97.6 F (36.4 C) (01/09 0840) Pulse Rate:  [92-96] 96 (01/09 0840) Cardiac Rhythm: Normal sinus rhythm (01/09 0700) Resp:  [14-18] 16 (01/09 0840) BP: (97-110)/(49-66) 97/49 (01/09 0840) SpO2:  [99 %-100 %] 100 % (01/09 0840)     Intake/Output from previous day: 01/08 0701 - 01/09 0700 In: 3 [I.V.:3] Out: -    Physical Exam:  Cardiovascular: RRR Pulmonary: Clear to auscultation bilaterally but diminished overall Wounds: Dressing intact     Lab Results: CBC: Recent Labs    10/07/19 0253  WBC 7.4  HGB 9.6*  HCT 30.6*  PLT 481*   BMET:  Recent Labs    10/06/19 0400 10/07/19 0253  NA  --  140  K  --  4.3  CL  --  102  CO2  --  28  GLUCOSE  --  118*  BUN  --  12  CREATININE 0.76 0.74  CALCIUM  --  9.1    PT/INR: No results for input(s): LABPROT, INR in the last 72 hours. ABG:  INR: Will add last result for INR, ABG once components are confirmed Will add last 4 CBG results once components are confirmed  Assessment/Plan:  1. CV - SR. 2.  Pulmonary - History of COPD. On 4 liters or oxygen via [place]. Chest tube with scant outpu last 24 hourst , to suction (as failed trial of water seal previously) and has persistent, small air leak. CXR this am shows pigtail removed with an increase in right pneumothorax.  She is not in respiratory distress this am. Likely needs pigtail replaced;will discuss with Dr. Kipp Brood. 3. Mental status-mild dementia history, per primary  Donielle M ZimmermanPA-C 10/08/2019,10:12 AM     ]   Agree with above CT came out Will replace with IR  [place]

## 2019-10-08 NOTE — Progress Notes (Signed)
PROGRESS NOTE    Amanda [name]  [id number] DOB: 1941/01/07 DOA: 09/29/2019 PCP: Lemmie Evens, MD   Brief Narrative: Per HPI 79 year old female W/ severe COPD and ,ild dementia admitted w/ 3rd or c4th spontaneous PTX in past year. Recently had Covid-19 09/22/2019) and son just died from it. Had chest tube placed in R in ED 12/31 w/ improvement in dyspnea and PTX.  Patient seen by cardiothoracic surgery. Patient has chest tube in place on the right lung remains fully expanded on repeat chest x-ray 1/4. 1/6-chest tube to waterseal-repeat chest x-ray showing worsening pneumothorax chest tube placed back to suction 1/9-chest tube was dislodged but patient not in distress to TCTS called IR to place PIG tail catheter  Subjective:  Seen this morning alert awake oriented with baseline dementia. Not in acute distress.  Chest tube in place.  Assessment & Plan:  Recurrent spontaneous pneumothorax: when changed to  waterseal, pneumothorax got worse, placed back on suction.TCTS planning on endobronchial valve sometime next week.  However chest tube got dislodged 1/9-  IR consulted to place pigtail catheter today. Continue plan of care as per TCTS.  May need one-to-one to prevent chest tube dislodgment.  Dementia, mild: continue supportive care.  Patient had possible sundowning and also reported visual hallucination.  Checked UA TSH ammonia B12 and stable.  She is on chronic benzo,was on scheduled oxycodone which have changed to PRN, also added tramadol.  Minimize opiate use.  Continue supportive care.  Severe emphysema/COPD/chronic hypoxic respiratory failure on 3 L cannula at home: Stable on Symbicort albuterol.  Continue pulmonary support.  Hypokalemia-resolved.     History of coronavirus a month ago, was admitted at outside facility and had a spontaneous pneumothorax that was treated with chest tube at that time.  COVID-19 PCR and AG negative on admission. Nutrition: Nutrition Problem:  Increased nutrient needs Etiology: chronic illness(COPD, underweight) Signs/Symptoms: estimated needs Interventions: Ensure Enlive (each supplement provides 350kcal and 20 grams of protein), MVI  Body mass index is 16.29 kg/m.    DVT prophylaxis: lovenox Code Status: Not to intubate Family Communication: plan of care discussed with patient at bedside.  Reports she is outside of town currently living with a daughter locally. I had called her daughter and updated previously. TCTS to call and d/w abt EBV. Disposition Plan: Remains inpatient pending further plan for chest tube given recurrent pneumothorax and now plan for EBV.    Consultants: CT surgery Procedures: Chest tube  CXR 1/4 Chest tube on the right without evident pneumothorax. Underlying emphysematous change and fibrosis. Small right pleural effusion. No edema or consolidation is demonstrable. Stable cardiac silhouette. Carotid artery calcification noted bilaterally.  Microbiology: none Antimicrobials: Anti-infectives (From admission, onward)   None       Objective: Vitals:   10/07/19 2100 10/07/19 2349 10/08/19 0415 10/08/19 0840  BP:  (!) 97/51 107/65 (!) 97/49  Pulse:  96 92 96  Resp: 18 18 14 16   Temp:  97.7 F (36.5 C) 98 F (36.7 C) 97.6 F (36.4 C)  TempSrc:  Oral  Oral  SpO2:  99% 99% 100%  Weight:      Height:        Intake/Output Summary (Last 24 hours) at 10/08/2019 1131 Last data filed at 10/07/2019 2243 Gross per 24 hour  Intake 3 ml  Output --  Net 3 ml   Filed Weights   09/29/19 1401 09/30/19 0005 10/04/19 0303  Weight: 47.6 kg 47 kg 44.4 kg   Weight change:  Body mass index is 16.29 kg/m.  Intake/Output from previous day: 01/08 0701 - 01/09 0700 In: 3 [I.V.:3] Out: -  Intake/Output this shift: No intake/output data recorded.  Examination:  General exam: Alert awake oriented to place, people, on nasal cannula, not in distress.   HEENT:Oral mucosa moist, Ear/Nose WNL grossly,  dentition normal. Respiratory system: Bilateral air entry present bilateral basal crackles, chest tube present on the right chest with dressing intact.    Cardiovascular system: S1 & S2 +, No JVD,. Gastrointestinal system: Abdomen soft nontender nondistended.   Nervous System: aao, non focal Extremities: No ankle edema, distal peripheral pulses palpable.  Skin: No rashes,no icterus. MSK: Normal muscle bulk,tone, power  Medications:  Scheduled Meds: . ALPRAZolam  0.5 mg Oral Daily  . docusate sodium  100 mg Oral BID  . enoxaparin (LOVENOX) injection  40 mg Subcutaneous Q24H  . feeding supplement  1 Container Oral TID BM  . lidocaine-EPINEPHrine  20 mL Intradermal Once  . mouth rinse  15 mL Mouth Rinse BID  . mometasone-formoterol  2 puff Inhalation BID  . multivitamin with minerals  1 tablet Oral Daily  . senna  1 tablet Oral Daily  . sodium chloride flush  3 mL Intravenous Q12H   Continuous Infusions: . sodium chloride      Data Reviewed: I have personally reviewed following labs and imaging studies  CBC: Recent Labs  Lab 10/04/19 0452 10/05/19 0343 10/07/19 0253  WBC 8.4 7.9 7.4  HGB 9.8* 9.5* 9.6*  HCT 30.1* 29.7* 30.6*  MCV 93.8 95.2 95.0  PLT 418* 439* [id number]*   Basic Metabolic Panel: Recent Labs  Lab 10/04/19 0452 10/04/19 1237 10/05/19 0343 10/06/19 0400 10/07/19 0253  NA 138 136 136  --  140  K 5.4* 4.3 4.5  --  4.3  CL 101 98 100  --  102  CO2 25 27 25   --  28  GLUCOSE 103* 110* 94  --  118*  BUN 14 14 13   --  12  CREATININE 0.80 0.72 0.70 0.76 0.74  CALCIUM 9.2 9.2 8.9  --  9.1   GFR: Estimated Creatinine Clearance: 40.6 mL/min (by C-G formula based on SCr of 0.74 mg/dL). Liver Function Tests: Recent Labs  Lab 10/07/19 0253  AST 23  ALT 21  ALKPHOS 60  BILITOT 0.3  PROT 6.1*  ALBUMIN 2.2*   No results for input(s): LIPASE, AMYLASE in the last 168 hours. Recent Labs  Lab 10/06/19 1701  AMMONIA 16   Coagulation Profile: No results for  input(s): INR, PROTIME in the last 168 hours. Cardiac Enzymes: No results for input(s): CKTOTAL, CKMB, CKMBINDEX, TROPONINI in the last 168 hours. BNP (last 3 results) No results for input(s): PROBNP in the last 8760 hours. HbA1C: No results for input(s): HGBA1C in the last 72 hours. CBG: No results for input(s): GLUCAP in the last 168 hours. Lipid Profile: No results for input(s): CHOL, HDL, LDLCALC, TRIG, CHOLHDL, LDLDIRECT in the last 72 hours. Thyroid Function Tests: Recent Labs    10/06/19 1701  TSH 0.635   Anemia Panel: Recent Labs    10/06/19 1701  VITAMINB12 280   Sepsis Labs: No results for input(s): PROCALCITON, LATICACIDVEN in the last 168 hours.  Recent Results (from the past 240 hour(s))  Respiratory Panel by RT PCR (Flu A&B, Covid) - Nasopharyngeal Swab     Status: None   Collection Time: 09/29/19  6:05 PM   Specimen: Nasopharyngeal Swab  Result Value Ref Range Status  SARS Coronavirus 2 by RT PCR NEGATIVE NEGATIVE Final    Comment: (NOTE) SARS-CoV-2 target nucleic acids are NOT DETECTED. The SARS-CoV-2 RNA is generally detectable in upper respiratoy specimens during the acute phase of infection. The lowest concentration of SARS-CoV-2 viral copies this assay can detect is 131 copies/mL. A negative result does not preclude SARS-Cov-2 infection and should not be used as the sole basis for treatment or other patient management decisions. A negative result may occur with  improper specimen collection/handling, submission of specimen other than nasopharyngeal swab, presence of viral mutation(s) within the areas targeted by this assay, and inadequate number of viral copies (<131 copies/mL). A negative result must be combined with clinical observations, patient history, and epidemiological information. The expected result is Negative. Fact Sheet for Patients:  PinkCheek.be Fact Sheet for Healthcare Providers:    GravelBags.it This test is not yet ap proved or cleared by the [place] FDA and  has been authorized for detection and/or diagnosis of SARS-CoV-2 by FDA under an Emergency Use Authorization (EUA). This EUA will remain  in effect (meaning this test can be used) for the duration of the COVID-19 declaration under Section 564(b)(1) of the Act, 21 U.S.C. section 360bbb-3(b)(1), unless the authorization is terminated or revoked sooner.    Influenza A by PCR NEGATIVE NEGATIVE Final   Influenza B by PCR NEGATIVE NEGATIVE Final    Comment: (NOTE) The Xpert Xpress SARS-CoV-2/FLU/RSV assay is intended as an aid in  the diagnosis of influenza from Nasopharyngeal swab specimens and  should not be used as a sole basis for treatment. Nasal washings and  aspirates are unacceptable for Xpert Xpress SARS-CoV-2/FLU/RSV  testing. Fact Sheet for Patients: PinkCheek.be Fact Sheet for Healthcare Providers: GravelBags.it This test is not yet approved or cleared by the [place] FDA and  has been authorized for detection and/or diagnosis of SARS-CoV-2 by  FDA under an Emergency Use Authorization (EUA). This EUA will remain  in effect (meaning this test can be used) for the duration of the  Covid-19 declaration under Section 564(b)(1) of the Act, 21  U.S.C. section 360bbb-3(b)(1), unless the authorization is  terminated or revoked. Performed at [facility], [address] [place], [place] [address]   MRSA PCR Screening     Status: None   Collection Time: 09/30/19  1:04 AM   Specimen: Nasopharyngeal  Result Value Ref Range Status   MRSA by PCR NEGATIVE NEGATIVE Final    Comment:        The GeneXpert MRSA Assay (FDA approved for NASAL specimens only), is one component of a comprehensive MRSA colonization surveillance program. It is not intended to diagnose MRSA infection nor to guide or monitor treatment  for MRSA infections. Performed at [facility], [place] [address], [place], [place] [address]       Radiology Studies: DG CHEST PORT 1 VIEW  Result Date: 10/08/2019 CLINICAL DATA:  Pneumothorax. EXAM: PORTABLE CHEST 1 VIEW COMPARISON:  10/07/2019 FINDINGS: The cardiomediastinal silhouette is unchanged with normal heart size. The lungs remain hyperinflated with chronic widespread interstitial opacities and asymmetric left apical pleuroparenchymal scarring. The right chest tube has been removed, and a small right pneumothorax has enlarged. There is unchanged blunting of the right costophrenic angle which may represent a trace pleural effusion. IMPRESSION: Interval enlargement of small right pneumothorax following chest tube removal. Electronically Signed   By: Logan Bores M.D.   On: 10/08/2019 10:04   DG CHEST PORT 1 VIEW  Result Date: 10/07/2019 CLINICAL  DATA:  Chest tube. EXAM: PORTABLE CHEST 1 VIEW COMPARISON:  10/06/2019.  10/05/2019.  06/25/2018. FINDINGS: Right chest tube in stable position. Right pneumothorax has improved from prior exam with mild residual. Chronic bilateral interstitial changes are again noted. Superimposed active interstitial process cannot be excluded. Stable bilateral pleural thickening. Heart size stable. IMPRESSION: 1. Right chest tube in stable position. Right pneumothorax is improved from prior exam with mild residual. 2. Chronic bilateral interstitial changes are again noted. Superimposed active interstitial process cannot be excluded. No interim change from prior exam. Electronically Signed   By: Marcello Moores  Register   On: 10/07/2019 06:33      LOS: 9 days   Time spent: More than 50% of that time was spent in counseling and/or coordination of care.  Antonieta Pert, MD [facility]  10/08/2019, 11:31 AM

## 2019-10-09 ENCOUNTER — Inpatient Hospital Stay (HOSPITAL_COMMUNITY): Payer: Medicare Other

## 2019-10-09 LAB — CBC
HCT: 29.7 % — ABNORMAL LOW (ref 36.0–46.0)
HCT: 29.7 % — ABNORMAL LOW (ref 36.0–46.0)
Hemoglobin: 9.4 g/dL — ABNORMAL LOW (ref 12.0–15.0)
Hemoglobin: 9.4 g/dL — ABNORMAL LOW (ref 12.0–15.0)
MCH: 30 pg (ref 26.0–34.0)
MCH: 29.7 pg (ref 26.0–34.0)
MCHC: 31.6 g/dL (ref 30.0–36.0)
MCHC: 31.6 g/dL (ref 30.0–36.0)
MCV: 94.9 fL (ref 80.0–100.0)
MCV: 94 fL (ref 80.0–100.0)
Platelets: 487 10*3/uL — ABNORMAL HIGH (ref 150–400)
Platelets: 478 10*3/uL — ABNORMAL HIGH (ref 150–400)
RBC: 3.13 MIL/uL — ABNORMAL LOW (ref 3.87–5.11)
RBC: 3.16 MIL/uL — ABNORMAL LOW (ref 3.87–5.11)
RDW: 14.5 % (ref 11.5–15.5)
RDW: 14.3 % (ref 11.5–15.5)
WBC: 8.2 10*3/uL (ref 4.0–10.5)
WBC: 6.7 10*3/uL (ref 4.0–10.5)
nRBC: 0 % (ref 0.0–0.2)
nRBC: 0 % (ref 0.0–0.2)

## 2019-10-09 LAB — COMPREHENSIVE METABOLIC PANEL
ALT: 21 U/L (ref 0–44)
AST: 21 U/L (ref 15–41)
Albumin: 2.1 g/dL — ABNORMAL LOW (ref 3.5–5.0)
Alkaline Phosphatase: 61 U/L (ref 38–126)
Anion gap: 6 (ref 5–15)
BUN: 13 mg/dL (ref 8–23)
CO2: 28 mmol/L (ref 22–32)
Calcium: 9.3 mg/dL (ref 8.9–10.3)
Chloride: 104 mmol/L (ref 98–111)
Creatinine, Ser: 0.66 mg/dL (ref 0.44–1.00)
GFR calc Af Amer: >60 mL/min (ref >60)
GFR calc non Af Amer: >60 mL/min (ref >60)
Glucose, Bld: 105 mg/dL — ABNORMAL HIGH (ref 70–99)
Potassium: 4.6 mmol/L (ref 3.5–5.1)
Sodium: 138 mmol/L (ref 135–145)
Total Bilirubin: 0.5 mg/dL (ref 0.3–1.2)
Total Protein: 6.2 g/dL — ABNORMAL LOW (ref 6.5–8.1)

## 2019-10-09 LAB — BASIC METABOLIC PANEL
Anion gap: 10 (ref 5–15)
BUN: 15 mg/dL (ref 8–23)
CO2: 27 mmol/L (ref 22–32)
Calcium: 9.1 mg/dL (ref 8.9–10.3)
Chloride: 103 mmol/L (ref 98–111)
Creatinine, Ser: 0.75 mg/dL (ref 0.44–1.00)
GFR calc Af Amer: >60 mL/min (ref >60)
GFR calc non Af Amer: >60 mL/min (ref >60)
Glucose, Bld: 106 mg/dL — ABNORMAL HIGH (ref 70–99)
Potassium: 4 mmol/L (ref 3.5–5.1)
Sodium: 140 mmol/L (ref 135–145)

## 2019-10-09 LAB — PROTIME-INR
INR: 1.1 (ref 0.8–1.2)
Prothrombin Time: 13.9 seconds (ref 11.4–15.2)

## 2019-10-09 NOTE — Progress Notes (Signed)
Referring Physician(s): [place]  Supervising Physician: Markus Daft  Patient Status:  [facility] - In-pt  Chief Complaint: Right pneumothorax   Subjective: Patient doing fairly well this morning.  She does have some tenderness at right chest tube insertion site.  Denies worsening cough or shortness of breath.   Allergies: Ciprofloxacin, Lactose, Levofloxacin, Sulfa antibiotics, Sulfamethoxazole, Aspirin, Cephalexin, Clindamycin, Erythromycin base, Hydromorphone, and Penicillins  Medications: Prior to Admission medications   Medication Sig Start Date End Date Taking? Authorizing Provider  acetaminophen (TYLENOL) 500 MG tablet Take 500-1,000 mg by mouth daily as needed for mild pain or moderate pain.    Yes [provider]  albuterol (PROVENTIL HFA;VENTOLIN HFA) 108 (90 Base) MCG/ACT inhaler Inhale 1-2 puffs into the lungs every 6 (six) hours as needed for wheezing or shortness of breath. 09/29/18  Yes Hayden Rasmussen, MD  ALPRAZolam Duanne Moron) 0.5 MG tablet Take 0.5 mg by mouth daily.    Yes [provider]  Cholecalciferol (VITAMIN D-1000 MAX ST) 1000 units tablet Take 1,000 Units by mouth daily.    Yes [provider]  Multiple Vitamin (MULTIVITAMIN) capsule Take 1 capsule by mouth daily.    Yes [provider]  oxyCODONE-acetaminophen (PERCOCET/ROXICET) 5-325 MG tablet Take 1 tablet by mouth 3 (three) times daily. 09/05/19  Yes [provider]  SYMBICORT 80-4.5 MCG/ACT inhaler Inhale 2 puffs into the lungs 2 (two) times daily. 09/05/19  Yes [provider]  vitamin C (ASCORBIC ACID) 250 MG tablet Take 500 mg by mouth daily.    Yes [provider]     Vital Signs: BP 109/62 (BP Location: Right Arm)   Pulse 87   Temp 97.6 F (36.4 C) (Oral)   Resp 18   Ht 5\' 5"  (1.651 m)   Wt 97 lb 14.2 oz (44.4 kg)   SpO2 98%   BMI 16.29 kg/m   Physical Exam awake, alert.  Right chest tube intact, insertion site okay, no apparent  airleak in Pleur-evac; chest tube to wall suction.  Imaging: DG CHEST PORT 1 VIEW  Result Date: 10/09/2019 CLINICAL DATA:  Right pneumothorax. EXAM: PORTABLE CHEST 1 VIEW COMPARISON:  10/08/2019 FINDINGS: The cardiomediastinal silhouette is unchanged with normal heart size. A new pigtail right chest tube has been placed. There is a small right-sided pneumothorax predominantly located laterally in the upper hemithorax which is slightly larger than on yesterday's chest radiograph and significantly larger than on the yesterday's procedural CT images following chest tube placement. The lungs remain hyperinflated with similar appearance of widespread coarse interstitial densities and asymmetric left apical pleuroparenchymal scarring. There is a persistent small right pleural effusion. Mild bibasilar atelectasis and or scarring is also unchanged. IMPRESSION: Right chest tube in place with small right pneumothorax, larger than on yesterday's postprocedural CT images following chest tube placement. Electronically Signed   By: Logan Bores M.D.   On: 10/09/2019 09:03   DG CHEST PORT 1 VIEW  Result Date: 10/08/2019 CLINICAL DATA:  Pneumothorax. EXAM: PORTABLE CHEST 1 VIEW COMPARISON:  10/07/2019 FINDINGS: The cardiomediastinal silhouette is unchanged with normal heart size. The lungs remain hyperinflated with chronic widespread interstitial opacities and asymmetric left apical pleuroparenchymal scarring. The right chest tube has been removed, and a small right pneumothorax has enlarged. There is unchanged blunting of the right costophrenic angle which may represent a trace pleural effusion. IMPRESSION: Interval enlargement of small right pneumothorax following chest tube removal. Electronically Signed   By: Logan Bores M.D.   On: 10/08/2019 10:04  DG CHEST PORT 1 VIEW  Result Date: 10/07/2019 CLINICAL DATA:  Chest tube. EXAM: PORTABLE CHEST 1 VIEW COMPARISON:  10/06/2019.  10/05/2019.  06/25/2018. FINDINGS: Right  chest tube in stable position. Right pneumothorax has improved from prior exam with mild residual. Chronic bilateral interstitial changes are again noted. Superimposed active interstitial process cannot be excluded. Stable bilateral pleural thickening. Heart size stable. IMPRESSION: 1. Right chest tube in stable position. Right pneumothorax is improved from prior exam with mild residual. 2. Chronic bilateral interstitial changes are again noted. Superimposed active interstitial process cannot be excluded. No interim change from prior exam. Electronically Signed   By: Marcello Moores  Register   On: 10/07/2019 06:33   DG CHEST PORT 1 VIEW  Result Date: 10/06/2019 CLINICAL DATA:  Recurrent spontaneous pneumothorax. EXAM: PORTABLE CHEST 1 VIEW COMPARISON:  10/05/2019.  06/21/2018. FINDINGS: Interim repositioning of right chest tube, its tip is over the right mid chest. Right pneumothorax may be slightly larger on today's exam. Unchanged diffuse bilateral interstitial prominence. Cardiomegaly. No pulmonary venous congestion. Stable bilateral pleural thickening consistent scarring. Interim resolution of right pleural effusion. Tiny left pleural effusion cannot be excluded IMPRESSION: 1. Interim repositioning of right chest tube, its tip is over the right mid chest. Right pneumothorax may be slightly larger on today's exam. 2. Unchanged diffuse bilateral interstitial prominence consistent chronic interstitial lung disease. 3.  Cardiomegaly.  No pulmonary venous congestion. 4. Interim resolution of right pleural effusion. Tiny left pleural effusion cannot be excluded. Electronically Signed   By: Marcello Moores  Register   On: 10/06/2019 08:10   CT IMAGE GUIDED DRAINAGE BY PERCUTANEOUS CATHETER  Result Date: 10/08/2019 INDICATION: 79 year old with history of a right pneumothorax and recently dislodged chest tube. Patient has recurrent small pneumothorax and request for new chest tube placement. EXAM: CT-GUIDED RIGHT CHEST TUBE  PLACEMENT MEDICATIONS: Moderate sedation ANESTHESIA/SEDATION: 1.0 mg IV Versed 50 mcg IV Fentanyl Moderate Sedation Time:  12 minutes The patient was continuously monitored during the procedure by the interventional radiology nurse under my direct supervision. COMPLICATIONS: None immediate. TECHNIQUE: Informed written consent was obtained from the patient after a thorough discussion of the procedural risks, benefits and alternatives. All questions were addressed. Maximal Sterile Barrier Technique was utilized including caps, mask, sterile gowns, sterile gloves, sterile drape, Ruderman hygiene and skin antiseptic. A timeout was performed prior to the initiation of the procedure. PROCEDURE: Patient was placed supine on the CT scanner. Images through the chest were obtained. Right pneumothorax was identified and targeted. The right axillary region was prepped with chlorhexidine and sterile field was created. Skin and soft tissues were anesthetized with 1% lidocaine. Using CT guidance, a Yueh catheter was directed into the pleural space and air was aspirated. Stiff Amplatz wire was advanced into the pleural space. The tract was dilated to accommodate a 14 [place] multipurpose drain. Follow up CT images demonstrated decreased size of the pneumothorax after the chest tube was placed to wall suction. Catheter was sutured in place and secured to skin with a StatLock device. Bandage was placed over the chest tube. FINDINGS: Severe emphysema with a small irregular pneumothorax in the right upper chest. No significant lymphadenopathy. Trace right pleural fluid. Chest tube was placed in the right upper axillary region. Tip of the tube is near the anterior apex of the right lung. Pneumothorax had markedly decreased in size following placement of the chest tube. IMPRESSION: CT-guided placement of a right chest tube for the residual right pneumothorax. Electronically Signed   By: Markus Daft  M.D.   On: 10/08/2019 18:10     Labs:  CBC: Recent Labs    10/04/19 0452 10/05/19 0343 10/07/19 0253 10/09/19 0305  WBC 8.4 7.9 7.4 8.2  HGB 9.8* 9.5* 9.6* 9.4*  HCT 30.1* 29.7* 30.6* 29.7*  PLT 418* 439* 481* 487*    COAGS: Recent Labs    10/08/19 1237  INR 1.0    BMP: Recent Labs    10/04/19 1237 10/05/19 0343 10/06/19 0400 10/07/19 0253 10/09/19 0305  NA 136 136  --  140 140  K 4.3 4.5  --  4.3 4.0  CL 98 100  --  102 103  CO2 27 25  --  28 27  GLUCOSE 110* 94  --  118* 106*  BUN 14 13  --  12 15  CALCIUM 9.2 8.9  --  9.1 9.1  CREATININE 0.72 0.70 0.76 0.74 0.75  GFRNONAA >60 >60 >60 >60 >60  GFRAA >60 >60 >60 >60 >60    LIVER FUNCTION TESTS: Recent Labs    10/01/19 0256 10/07/19 0253  BILITOT 0.6 0.3  AST 24 23  ALT 34 21  ALKPHOS 60 60  PROT 6.1* 6.1*  ALBUMIN 2.1* 2.2*    Assessment and Plan: 79 year old female with history of bladder cancer, severe COPD, COVID-19 in November 2020 and some mild dementia who was admitted recently with spontaneous right pneumothorax.  Initially had chest tube placed in ED on 12/31 with persistent air leak issues and failed waterseal testing.  Right chest tube was dislodged on 1/9; status post replacement of right chest tube placement by Dr. Anselm Pancoast 1/9; afebrile, WBC normal, hemoglobin stable, creat normal, chest x-ray today with small right pneumothorax slightly larger than on yesterday's post procedure CT images; continue tube to wall suction for now, follow-up chest x-ray in a.m. as per TCTS note; patient under consideration for IVB placement on Monday.   Electronically Signed: D. Rowe Robert, PA-C 10/09/2019, 9:29 AM   I spent a total of 15 minutes at the the patient's bedside AND on the patient's hospital floor or unit, greater than 50% of which was counseling/coordinating care for right chest tube    Patient ID: Amanda [name], female   DOB: 22-Mar-1941, 79 y.o.   MRN: [id number]

## 2019-10-09 NOTE — Progress Notes (Signed)
PROGRESS NOTE    SHARAINE LOOKABILL  [id number] DOB: 10-17-1940 DOA: 09/29/2019 PCP: Lemmie Evens, MD   Brief Narrative:  Per HPI 79 year old female W/ severe COPD and ,ild dementia admitted w/ 3rd or c4th spontaneous PTX in past year. Recently had Covid-19 09/20/2019) and son just died from it. Had chest tube placed in R in ED 12/31 w/ improvement in dyspnea and PTX.  Patient seen by cardiothoracic surgery. Patient has chest tube in place on the right lung remains fully expanded on repeat chest x-ray 1/4. 1/6-chest tube to waterseal-repeat chest x-ray showing worsening pneumothorax chest tube placed back to suction 1/9-chest tube was dislodged but patient not in distress to TCTS called IR to place PIG tail catheter which was done 1/9 PM   Assessment & Plan:   Principal Problem:   Recurrent spontaneous pneumothorax Active Problems:   SOB (shortness of breath)   Spontaneous pneumothorax   Dementia (HCC)   Protein-calorie malnutrition, severe   Emphysema/COPD (HCC)   Hypokalemia   Recurrent spontaneous pneumothorax: when changed to  waterseal, pneumothorax got worse, placed back on suction.TCTS planning on endobronchial valve sometime next week.  However chest tube got dislodged 1/9-  IR consulted and replaced chest tube last evening.  Plan is for IBV valve system in the morning secondary to persistent air leak  Dementia, mild: continue supportive care.  Patient had possible sundowning and also reported visual hallucination.  Checked UA TSH ammonia B12 and stable.  She is on chronic benzo,was on scheduled oxycodone which have changed to PRN, also added tramadol.  Minimize opiate use.  Continue supportive care.  Severe emphysema/COPD/chronic hypoxic respiratory failure on 3 L cannula at home: Stable on Symbicort albuterol.  Continue pulmonary support.  Hypokalemia-resolved.     History of coronavirus a month ago, was admitted at outside facility and had a spontaneous  pneumothorax that was treated with chest tube at that time.  COVID-19 PCR and AG negative on admission.  Nutrition: Nutrition Problem: Increased nutrient needs Etiology: chronic illness(COPD, underweight) Signs/Symptoms: estimated needs Interventions: Ensure Enlive (each supplement provides 350kcal and 20 grams of protein), MVI  Body mass index is 16.29 kg/m.   DVT prophylaxis: Lovenox SQ        Code Status: DO NOT INTUBATE    Code Status Orders  (From admission, onward)         Start     Ordered   09/29/19 1715  Limited resuscitation (code)  Continuous    Question Answer Comment  In the event of cardiac or respiratory ARREST: Initiate Code Blue, Call Rapid Response Yes   In the event of cardiac or respiratory ARREST: Perform CPR Yes   In the event of cardiac or respiratory ARREST: Perform Intubation/Mechanical Ventilation No   In the event of cardiac or respiratory ARREST: Use NIPPV/BiPAp only if indicated Yes   In the event of cardiac or respiratory ARREST: Administer ACLS medications if indicated Yes   In the event of cardiac or respiratory ARREST: Perform Defibrillation or Cardioversion if indicated Yes      09/29/19 1717        Code Status History    Date Active Date Inactive Code Status Order ID Comments User Context   06/24/2018 1024 06/25/2018 1716 Full Code [id number]  Cvijetic, Forrest Moron, RN Inpatient   Advance Care Planning Activity     Family Comm: None today TCTS to discuss IBV health system with daughter *  Disposition Plan:   Patient remained inpatient for continued management  of chest tube secondary to recurrent pneumothorax and plan for IVB VALVE system in a.m. Consults called: None Admission status: Inpatient   Consultants:   Cardiothoracic surgery  Procedures:  DG CHEST PORT 1 VIEW  Result Date: 10/09/2019 CLINICAL DATA:  Right pneumothorax. EXAM: PORTABLE CHEST 1 VIEW COMPARISON:  10/08/2019 FINDINGS: The cardiomediastinal silhouette is unchanged  with normal heart size. A new pigtail right chest tube has been placed. There is a small right-sided pneumothorax predominantly located laterally in the upper hemithorax which is slightly larger than on yesterday's chest radiograph and significantly larger than on the yesterday's procedural CT images following chest tube placement. The lungs remain hyperinflated with similar appearance of widespread coarse interstitial densities and asymmetric left apical pleuroparenchymal scarring. There is a persistent small right pleural effusion. Mild bibasilar atelectasis and or scarring is also unchanged. IMPRESSION: Right chest tube in place with small right pneumothorax, larger than on yesterday's postprocedural CT images following chest tube placement. Electronically Signed   By: Logan Bores M.D.   On: 10/09/2019 09:03   DG CHEST PORT 1 VIEW  Result Date: 10/08/2019 CLINICAL DATA:  Pneumothorax. EXAM: PORTABLE CHEST 1 VIEW COMPARISON:  10/07/2019 FINDINGS: The cardiomediastinal silhouette is unchanged with normal heart size. The lungs remain hyperinflated with chronic widespread interstitial opacities and asymmetric left apical pleuroparenchymal scarring. The right chest tube has been removed, and a small right pneumothorax has enlarged. There is unchanged blunting of the right costophrenic angle which may represent a trace pleural effusion. IMPRESSION: Interval enlargement of small right pneumothorax following chest tube removal. Electronically Signed   By: Logan Bores M.D.   On: 10/08/2019 10:04   DG CHEST PORT 1 VIEW  Result Date: 10/07/2019 CLINICAL DATA:  Chest tube. EXAM: PORTABLE CHEST 1 VIEW COMPARISON:  10/06/2019.  10/05/2019.  06/25/2018. FINDINGS: Right chest tube in stable position. Right pneumothorax has improved from prior exam with mild residual. Chronic bilateral interstitial changes are again noted. Superimposed active interstitial process cannot be excluded. Stable bilateral pleural thickening.  Heart size stable. IMPRESSION: 1. Right chest tube in stable position. Right pneumothorax is improved from prior exam with mild residual. 2. Chronic bilateral interstitial changes are again noted. Superimposed active interstitial process cannot be excluded. No interim change from prior exam. Electronically Signed   By: Marcello Moores  Register   On: 10/07/2019 06:33   DG CHEST PORT 1 VIEW  Result Date: 10/06/2019 CLINICAL DATA:  Recurrent spontaneous pneumothorax. EXAM: PORTABLE CHEST 1 VIEW COMPARISON:  10/05/2019.  06/21/2018. FINDINGS: Interim repositioning of right chest tube, its tip is over the right mid chest. Right pneumothorax may be slightly larger on today's exam. Unchanged diffuse bilateral interstitial prominence. Cardiomegaly. No pulmonary venous congestion. Stable bilateral pleural thickening consistent scarring. Interim resolution of right pleural effusion. Tiny left pleural effusion cannot be excluded IMPRESSION: 1. Interim repositioning of right chest tube, its tip is over the right mid chest. Right pneumothorax may be slightly larger on today's exam. 2. Unchanged diffuse bilateral interstitial prominence consistent chronic interstitial lung disease. 3.  Cardiomegaly.  No pulmonary venous congestion. 4. Interim resolution of right pleural effusion. Tiny left pleural effusion cannot be excluded. Electronically Signed   By: Marcello Moores  Register   On: 10/06/2019 08:10   DG CHEST PORT 1 VIEW  Result Date: 10/05/2019 CLINICAL DATA:  Recurrent pneumothorax. Shortness of breath. EXAM: PORTABLE CHEST 1 VIEW COMPARISON:  10/04/2019 FINDINGS: A right chest tube terminates over the lung apex, unchanged. The cardiomediastinal silhouette is unchanged with normal heart size. The  lungs remain hyperinflated with unchanged diffuse interstitial densities and biapical scarring. No definite superimposed airspace consolidation is identified. There is a small right pleural effusion. No pneumothorax is identified. IMPRESSION:  1. Stable position of right chest tube without a pneumothorax identified. 2. Chronic interstitial lung disease and small right pleural effusion. Electronically Signed   By: Logan Bores M.D.   On: 10/05/2019 08:55   DG CHEST PORT 1 VIEW  Result Date: 10/04/2019 CLINICAL DATA:  Shortness of breath.  Follow-up pneumothorax. EXAM: PORTABLE CHEST 1 VIEW COMPARISON:  10/03/2019.  06/22/2018.  CT 06/22/2018. FINDINGS: Right chest tube in stable position. No pneumothorax noted. Heart size stable. Stable changes of chronic interstitial lung disease. Stable bilateral pleural-parenchymal thickening consistent with scarring. IMPRESSION: 1.  Right chest tube in stable position.  No pneumothorax. 2. Stable chronic interstitial lung disease and bilateral pleural-parenchymal scarring. Electronically Signed   By: Marcello Moores  Register   On: 10/04/2019 07:52   DG CHEST PORT 1 VIEW  Result Date: 10/03/2019 CLINICAL DATA:  Recent pneumothorax.  History of bladder carcinoma EXAM: PORTABLE CHEST 1 VIEW COMPARISON:  October 02, 2019 and August 30, 2019 FINDINGS: Chest tube remains on the right with the tip in the right apex region. Currently there is no pneumothorax appreciable. There is underlying fibrosis throughout the lungs. There is a small right pleural effusion. There is no frank edema or consolidation. The heart size is normal. Pulmonary vascularity reflects underlying emphysematous changes and is stable. There is aortic atherosclerosis. No adenopathy evident. Calcification is noted in each carotid artery. IMPRESSION: Chest tube on the right without evident pneumothorax. Underlying emphysematous change and fibrosis. Small right pleural effusion. No edema or consolidation is demonstrable. Stable cardiac silhouette. Carotid artery calcification noted bilaterally. Aortic Atherosclerosis (ICD10-I70.0) and Emphysema (ICD10-J43.9). Electronically Signed   By: Lowella Grip III M.D.   On: 10/03/2019 07:35   DG CHEST PORT 1  VIEW  Result Date: 10/02/2019 CLINICAL DATA:  Follow-up pneumothorax EXAM: PORTABLE CHEST 1 VIEW COMPARISON:  Chest radiograph 10/01/2019 FINDINGS: Stable cardiomediastinal contours. Right pigtail catheter remains positioned towards the right apex. No definite pneumothorax. The lungs are hyperinflated. There are diffuse coarsened interstitial markings compatible with advanced chronic bronchitis/emphysema. Compared to prior radiographs there appear to be diffusely increased interstitial markings throughout the left greater than right lungs concerning for superimposed edema or infection. Trace right pleural fluid. IMPRESSION: 1. No definite pneumothorax with right chest tube in place. 2. Stable appearance of the lungs with findings of advanced COPD/chronic bronchitis and concern for superimposed edema or infection given increase in interstitial markings compared to prior radiographs. Electronically Signed   By: Audie Pinto M.D.   On: 10/02/2019 10:14   DG CHEST PORT 1 VIEW  Result Date: 10/01/2019 CLINICAL DATA:  Follow-up pneumothorax. EXAM: PORTABLE CHEST 1 VIEW COMPARISON:  09/30/2019 FINDINGS: Right-sided chest tube is again noted with pigtail overlying the right apex. Right-sided pneumothorax is no longer visualized. Small bilateral pleural effusions. The lungs are hyperinflated and there are diffuse coarsened interstitial marking throughout both lungs compatible with advanced COPD/emphysema. Increased interstitial opacities throughout both lungs concerning for either mild pulmonary edema or atypical infection IMPRESSION: 1. Right-sided pneumothorax is no longer visualized. 2. Small bilateral pleural effusions. 3. Superimposed increase interstitial opacities which may represent atypical infection versus edema. Electronically Signed   By: Kerby Moors M.D.   On: 10/01/2019 09:10   Portable chest 1 View  Result Date: 09/30/2019 CLINICAL DATA:  Recurrent spontaneous pneumothorax with chest tube in  place.  EXAM: PORTABLE CHEST 1 VIEW COMPARISON:  09/29/2019 FINDINGS: Right chest tube in place. a small residual right-sided pneumothorax is identified. This appears increased in volume compared with 09/29/2019 at 3:55 p.m. Hyperinflation with chronic interstitial changes noted consistent with advanced COPD/emphysema. Diffuse interstitial thickening is noted bilaterally, unchanged. IMPRESSION: 1. Small residual right-sided pneumothorax. This appears increased in volume compared with 09/29/19 at 3:55 p.m. 2. Advanced changes of COPD/emphysema with superimposed increase interstitial markings bilaterally. Electronically Signed   By: Kerby Moors M.D.   On: 09/30/2019 09:44   DG Chest Portable 1 View  Result Date: 09/29/2019 CLINICAL DATA:  COPD. Tube placement. Recent COVID-19 with collapse lung. EXAM: PORTABLE CHEST 1 VIEW COMPARISON:  Earlier today at 2:20 p.m. FINDINGS: 3:55 p.m. Placement of a right-sided chest tube. Midline trachea. Normal heart size. Atherosclerosis in the transverse aorta. Numerous leads and wires project over the chest. Probable trace right pleural fluid. No residual pneumothorax. Diffuse interstitial thickening. Similar bibasilar airspace disease. Left apical pleuroparenchymal scarring. IMPRESSION: Re-expansion of the right lung after chest tube placement. Diffuse interstitial thickening with similar bibasilar airspace disease, most likely atelectasis. Aortic Atherosclerosis (ICD10-I70.0). Electronically Signed   By: Abigail Miyamoto M.D.   On: 09/29/2019 16:07   DG Chest Portable 1 View  Result Date: 09/29/2019 CLINICAL DATA:  History of COVID on Thanksgiving. COPD. Wheezing in the right lung. EXAM: PORTABLE CHEST 1 VIEW COMPARISON:  Chest radiograph 08/30/2019 FINDINGS: Stable cardiomediastinal contours. There is a large right pneumothorax with compressive atelectasis of the right lung. Diffuse bilateral coarse interstitial markings. Lungs are hyperinflated. Biapical  pleuroparenchymal scarring. No acute findings in the visualized skeleton. IMPRESSION: 1. Large right pneumothorax with compressive atelectasis of the right lung. Probable fracture in a right lower anterolateral rib. 2. Chronic changes consistent with COPD and chronic bronchitis. These results were called by telephone at the time of interpretation on 09/29/2019 at 2:44 pm to provider Fredia Sorrow , who verbally acknowledged these results. Electronically Signed   By: Audie Pinto M.D.   On: 09/29/2019 14:47   CT IMAGE GUIDED DRAINAGE BY PERCUTANEOUS CATHETER  Result Date: 10/08/2019 INDICATION: 79 year old with history of a right pneumothorax and recently dislodged chest tube. Patient has recurrent small pneumothorax and request for new chest tube placement. EXAM: CT-GUIDED RIGHT CHEST TUBE PLACEMENT MEDICATIONS: Moderate sedation ANESTHESIA/SEDATION: 1.0 mg IV Versed 50 mcg IV Fentanyl Moderate Sedation Time:  12 minutes The patient was continuously monitored during the procedure by the interventional radiology nurse under my direct supervision. COMPLICATIONS: None immediate. TECHNIQUE: Informed written consent was obtained from the patient after a thorough discussion of the procedural risks, benefits and alternatives. All questions were addressed. Maximal Sterile Barrier Technique was utilized including caps, mask, sterile gowns, sterile gloves, sterile drape, Uno hygiene and skin antiseptic. A timeout was performed prior to the initiation of the procedure. PROCEDURE: Patient was placed supine on the CT scanner. Images through the chest were obtained. Right pneumothorax was identified and targeted. The right axillary region was prepped with chlorhexidine and sterile field was created. Skin and soft tissues were anesthetized with 1% lidocaine. Using CT guidance, a Yueh catheter was directed into the pleural space and air was aspirated. Stiff Amplatz wire was advanced into the pleural space. The tract was  dilated to accommodate a 14 [place] multipurpose drain. Follow up CT images demonstrated decreased size of the pneumothorax after the chest tube was placed to wall suction. Catheter was sutured in place and secured to skin with a StatLock device. Bandage was  placed over the chest tube. FINDINGS: Severe emphysema with a small irregular pneumothorax in the right upper chest. No significant lymphadenopathy. Trace right pleural fluid. Chest tube was placed in the right upper axillary region. Tip of the tube is near the anterior apex of the right lung. Pneumothorax had markedly decreased in size following placement of the chest tube. IMPRESSION: CT-guided placement of a right chest tube for the residual right pneumothorax. Electronically Signed   By: Markus Daft M.D.   On: 10/08/2019 18:10     Antimicrobials:   None   Subjective: Patient did well overnight, Does report some mild pain at chest tube site otherwise no complaints  Objective: Vitals:   10/08/19 2358 10/08/19 2359 10/09/19 0323 10/09/19 0517  BP:   109/62   Pulse: 92 92 91 87  Resp: (!) 25 20 20 18   Temp:   97.6 F (36.4 C)   TempSrc:   Oral   SpO2: 98% 98% 99% 98%  Weight:      Height:        Intake/Output Summary (Last 24 hours) at 10/09/2019 1029 Last data filed at 10/09/2019 0901 Gross per 24 hour  Intake --  Output 600 ml  Net -600 ml   Filed Weights   09/29/19 1401 09/30/19 0005 10/04/19 0303  Weight: 47.6 kg 47 kg 44.4 kg    Examination:  General exam: Appears calm and comfortable  Respiratory system: Diminished on right clear on left no rhonchi no wheezing Cardiovascular system: S1 & S2 heard, RRR. No JVD, murmurs, rubs, gallops or clicks. No pedal edema. Gastrointestinal system: Abdomen is nondistended, soft and nontender. No organomegaly or masses felt. Normal bowel sounds heard. Central nervous system: Alert and oriented. No focal neurological deficits. Extremities: Symmetric 5 x 5 power. Skin: Chest tube  site in place dressing intact did not take down no evidence of bleedthrough Psychiatry: Judgement and insight.  Secondary to known cognitive deficit.     Data Reviewed: I have personally reviewed following labs and imaging studies  CBC: Recent Labs  Lab 10/04/19 0452 10/05/19 0343 10/07/19 0253 10/09/19 0305  WBC 8.4 7.9 7.4 8.2  HGB 9.8* 9.5* 9.6* 9.4*  HCT 30.1* 29.7* 30.6* 29.7*  MCV 93.8 95.2 95.0 94.9  PLT 418* 439* 481* 0000000*   Basic Metabolic Panel: Recent Labs  Lab 10/04/19 0452 10/04/19 1237 10/05/19 0343 10/06/19 0400 10/07/19 0253 10/09/19 0305  NA 138 136 136  --  140 140  K 5.4* 4.3 4.5  --  4.3 4.0  CL 101 98 100  --  102 103  CO2 25 27 25   --  28 27  GLUCOSE 103* 110* 94  --  118* 106*  BUN 14 14 13   --  12 15  CREATININE 0.80 0.72 0.70 0.76 0.74 0.75  CALCIUM 9.2 9.2 8.9  --  9.1 9.1   GFR: Estimated Creatinine Clearance: 40.6 mL/min (by C-G formula based on SCr of 0.75 mg/dL). Liver Function Tests: Recent Labs  Lab 10/07/19 0253  AST 23  ALT 21  ALKPHOS 60  BILITOT 0.3  PROT 6.1*  ALBUMIN 2.2*   No results for input(s): LIPASE, AMYLASE in the last 168 hours. Recent Labs  Lab 10/06/19 1701  AMMONIA 16   Coagulation Profile: Recent Labs  Lab 10/08/19 1237  INR 1.0   Cardiac Enzymes: No results for input(s): CKTOTAL, CKMB, CKMBINDEX, TROPONINI in the last 168 hours. BNP (last 3 results) No results for input(s): PROBNP in the last 8760 hours.  HbA1C: No results for input(s): HGBA1C in the last 72 hours. CBG: No results for input(s): GLUCAP in the last 168 hours. Lipid Profile: No results for input(s): CHOL, HDL, LDLCALC, TRIG, CHOLHDL, LDLDIRECT in the last 72 hours. Thyroid Function Tests: Recent Labs    10/06/19 1701  TSH 0.635   Anemia Panel: Recent Labs    10/06/19 1701  VITAMINB12 280   Sepsis Labs: No results for input(s): PROCALCITON, LATICACIDVEN in the last 168 hours.  Recent Results (from the past 240  hour(s))  Respiratory Panel by RT PCR (Flu A&B, Covid) - Nasopharyngeal Swab     Status: None   Collection Time: 09/29/19  6:05 PM   Specimen: Nasopharyngeal Swab  Result Value Ref Range Status   SARS Coronavirus 2 by RT PCR NEGATIVE NEGATIVE Final    Comment: (NOTE) SARS-CoV-2 target nucleic acids are NOT DETECTED. The SARS-CoV-2 RNA is generally detectable in upper respiratoy specimens during the acute phase of infection. The lowest concentration of SARS-CoV-2 viral copies this assay can detect is 131 copies/mL. A negative result does not preclude SARS-Cov-2 infection and should not be used as the sole basis for treatment or other patient management decisions. A negative result may occur with  improper specimen collection/handling, submission of specimen other than nasopharyngeal swab, presence of viral mutation(s) within the areas targeted by this assay, and inadequate number of viral copies (<131 copies/mL). A negative result must be combined with clinical observations, patient history, and epidemiological information. The expected result is Negative. Fact Sheet for Patients:  PinkCheek.be Fact Sheet for Healthcare Providers:  GravelBags.it This test is not yet ap proved or cleared by the [place] FDA and  has been authorized for detection and/or diagnosis of SARS-CoV-2 by FDA under an Emergency Use Authorization (EUA). This EUA will remain  in effect (meaning this test can be used) for the duration of the COVID-19 declaration under Section 564(b)(1) of the Act, 21 U.S.C. section 360bbb-3(b)(1), unless the authorization is terminated or revoked sooner.    Influenza A by PCR NEGATIVE NEGATIVE Final   Influenza B by PCR NEGATIVE NEGATIVE Final    Comment: (NOTE) The Xpert Xpress SARS-CoV-2/FLU/RSV assay is intended as an aid in  the diagnosis of influenza from Nasopharyngeal swab specimens and  should not be used as  a sole basis for treatment. Nasal washings and  aspirates are unacceptable for Xpert Xpress SARS-CoV-2/FLU/RSV  testing. Fact Sheet for Patients: PinkCheek.be Fact Sheet for Healthcare Providers: GravelBags.it This test is not yet approved or cleared by the [place] FDA and  has been authorized for detection and/or diagnosis of SARS-CoV-2 by  FDA under an Emergency Use Authorization (EUA). This EUA will remain  in effect (meaning this test can be used) for the duration of the  Covid-19 declaration under Section 564(b)(1) of the Act, 21  U.S.C. section 360bbb-3(b)(1), unless the authorization is  terminated or revoked. Performed at [facility], [address] [place], [place] [address]   MRSA PCR Screening     Status: None   Collection Time: 09/30/19  1:04 AM   Specimen: Nasopharyngeal  Result Value Ref Range Status   MRSA by PCR NEGATIVE NEGATIVE Final    Comment:        The GeneXpert MRSA Assay (FDA approved for NASAL specimens only), is one component of a comprehensive MRSA colonization surveillance program. It is not intended to diagnose MRSA infection nor to guide or monitor treatment for MRSA infections. Performed at [facility], [address] [place], [place] [address]          Radiology Studies: DG CHEST PORT 1 VIEW  Result Date: 10/09/2019 CLINICAL DATA:  Right pneumothorax. EXAM: PORTABLE CHEST 1 VIEW COMPARISON:  10/08/2019 FINDINGS: The cardiomediastinal silhouette is unchanged with normal heart size. A new pigtail right chest tube has been placed. There is a small right-sided pneumothorax predominantly located laterally in the upper hemithorax which is slightly larger than on yesterday's chest radiograph and significantly larger than on the yesterday's procedural CT images following chest tube placement. The lungs remain hyperinflated with similar appearance of widespread coarse interstitial  densities and asymmetric left apical pleuroparenchymal scarring. There is a persistent small right pleural effusion. Mild bibasilar atelectasis and or scarring is also unchanged. IMPRESSION: Right chest tube in place with small right pneumothorax, larger than on yesterday's postprocedural CT images following chest tube placement. Electronically Signed   By: Logan Bores M.D.   On: 10/09/2019 09:03   DG CHEST PORT 1 VIEW  Result Date: 10/08/2019 CLINICAL DATA:  Pneumothorax. EXAM: PORTABLE CHEST 1 VIEW COMPARISON:  10/07/2019 FINDINGS: The cardiomediastinal silhouette is unchanged with normal heart size. The lungs remain hyperinflated with chronic widespread interstitial opacities and asymmetric left apical pleuroparenchymal scarring. The right chest tube has been removed, and a small right pneumothorax has enlarged. There is unchanged blunting of the right costophrenic angle which may represent a trace pleural effusion. IMPRESSION: Interval enlargement of small right pneumothorax following chest tube removal. Electronically Signed   By: Logan Bores M.D.   On: 10/08/2019 10:04   CT IMAGE GUIDED DRAINAGE BY PERCUTANEOUS CATHETER  Result Date: 10/08/2019 INDICATION: 79 year old with history of a right pneumothorax and recently dislodged chest tube. Patient has recurrent small pneumothorax and request for new chest tube placement. EXAM: CT-GUIDED RIGHT CHEST TUBE PLACEMENT MEDICATIONS: Moderate sedation ANESTHESIA/SEDATION: 1.0 mg IV Versed 50 mcg IV Fentanyl Moderate Sedation Time:  12 minutes The patient was continuously monitored during the procedure by the interventional radiology nurse under my direct supervision. COMPLICATIONS: None immediate. TECHNIQUE: Informed written consent was obtained from the patient after a thorough discussion of the procedural risks, benefits and alternatives. All questions were addressed. Maximal Sterile Barrier Technique was utilized including caps, mask, sterile gowns, sterile  gloves, sterile drape, [place] hygiene and skin antiseptic. A timeout was performed prior to the initiation of the procedure. PROCEDURE: Patient was placed supine on the CT scanner. Images through the chest were obtained. Right pneumothorax was identified and targeted. The right axillary region was prepped with chlorhexidine and sterile field was created. Skin and soft tissues were anesthetized with 1% lidocaine. Using CT guidance, a Yueh catheter was directed into the pleural space and air was aspirated. Stiff Amplatz wire was advanced into the pleural space. The tract was dilated to accommodate a 14 [place] multipurpose drain. Follow up CT images demonstrated decreased size of the pneumothorax after the chest tube was placed to wall suction. Catheter was sutured in place and secured to skin with a StatLock device. Bandage was placed over the chest tube. FINDINGS: Severe emphysema with a small irregular pneumothorax in the right upper chest. No significant lymphadenopathy. Trace right pleural fluid. Chest tube was placed in the right upper axillary region. Tip of the tube is near the anterior apex of the right lung. Pneumothorax had markedly decreased in size following placement of the chest tube. IMPRESSION: CT-guided placement of a right chest tube for the residual right pneumothorax. Electronically Signed   By: Markus Daft  M.D.   On: 10/08/2019 18:10        Scheduled Meds: . ALPRAZolam  0.5 mg Oral Daily  . docusate sodium  100 mg Oral BID  . enoxaparin (LOVENOX) injection  40 mg Subcutaneous Q24H  . feeding supplement  1 Container Oral TID BM  . lidocaine-EPINEPHrine  20 mL Intradermal Once  . mouth rinse  15 mL Mouth Rinse BID  . mometasone-formoterol  2 puff Inhalation BID  . multivitamin with minerals  1 tablet Oral Daily  . senna  1 tablet Oral Daily  . sodium chloride flush  3 mL Intravenous Q12H   Continuous Infusions: . sodium chloride       LOS: 10 days    Time spent: [place], MD [facility]  If 7PM-7AM, please contact night-coverage  10/09/2019, 10:29 AM

## 2019-10-09 NOTE — Progress Notes (Addendum)
      [address]       [place] [address]             [phone]           Subjective: Patient without specific complaint this am. She states her breathing is "good".  Objective: Vital signs in last 24 hours: Temp:  [97.6 F (36.4 C)-98.1 F (36.7 C)] 97.6 F (36.4 C) (01/10 0323) Pulse Rate:  [87-105] 87 (01/10 0517) Cardiac Rhythm: Normal sinus rhythm (01/10 0700) Resp:  [16-36] 18 (01/10 0517) BP: (97-114)/(48-65) 109/62 (01/10 0323) SpO2:  [90 %-100 %] 98 % (01/10 0517)     Intake/Output from previous day: 01/09 0701 - 01/10 0700 In: -  Out: 600 [Urine:600]   Physical Exam:  Cardiovascular: RRR Pulmonary: Diminished right apex, clear on left Wounds: Dressing intact     Lab Results: CBC: Recent Labs    10/07/19 0253 10/09/19 0305  WBC 7.4 8.2  HGB 9.6* 9.4*  HCT 30.6* 29.7*  PLT 481* 487*   BMET:  Recent Labs    10/07/19 0253 10/09/19 0305  NA 140 140  K 4.3 4.0  CL 102 103  CO2 28 27  GLUCOSE 118* 106*  BUN 12 15  CREATININE 0.74 0.75  CALCIUM 9.1 9.1    PT/INR:  Recent Labs    10/08/19 1237  LABPROT 13.5  INR 1.0   ABG:  INR: Will add last result for INR, ABG once components are confirmed Will add last 4 CBG results once components are confirmed  Assessment/Plan:  1. CV - SR. 2.  Pulmonary - History of COPD. On 2 liters or oxygen via [place]. IR placed a new pigtail chest tube yesterday as previous one "fell out". Chest tube with scant output last 24 hours , to 20 cm suction. There is no air leak this am. She previously had a persistent air leak. CXR this am appears to show right pneumothorax. Will increase suction. Check CXR in am. Patient tentatively scheduled for IBVs in am. 3. Mental status-mild dementia history, per primary  Donielle M ZimmermanPA-C 10/09/2019,8:03 AM    Agree with above No leak  Still npo for tomorrow  Lajuana Matte

## 2019-10-09 NOTE — Progress Notes (Signed)
I spoke with the patient's daughter and she would like to be called in the morning prior to doing any type of procedure on her mother.  I will pass this information along to the night shift RN.

## 2019-10-10 ENCOUNTER — Encounter (HOSPITAL_COMMUNITY): Payer: Self-pay | Admitting: Certified Registered"

## 2019-10-10 ENCOUNTER — Inpatient Hospital Stay (HOSPITAL_COMMUNITY): Payer: Medicare Other

## 2019-10-10 ENCOUNTER — Encounter (HOSPITAL_COMMUNITY)
Admission: EM | Disposition: A | Discharge status: Home Health | Payer: Self-pay | Source: Home / Self Care | Attending: Internal Medicine

## 2019-10-10 LAB — SURGICAL PCR SCREEN
MRSA, PCR: NEGATIVE
Staphylococcus aureus: NEGATIVE

## 2019-10-10 SURGERY — VIDEO BRONCHOSCOPY WITH ENDOBRONCHIAL NAVIGATION
Anesthesia: General

## 2019-10-10 NOTE — Progress Notes (Signed)
      [address]       [place] [address]             [phone]           Subjective: According to nurse, patient does not want surgery. Patient states she might consider surgery but a different day because she "has something to take care of in the hospital". Daughter asked to be contacted this am regarding treatment plan.  Objective: Vital signs in last 24 hours: Temp:  [98 F (36.7 C)-98.8 F (37.1 C)] 98 F (36.7 C) (01/11 0300) Pulse Rate:  [91-97] 97 (01/11 0300) Cardiac Rhythm: Supraventricular tachycardia (01/11 0214) Resp:  [17-29] 20 (01/11 0300) BP: (104-109)/(55-69) 105/57 (01/11 0300) SpO2:  [91 %-99 %] 97 % (01/11 0300)     Intake/Output from previous day: 01/10 0701 - 01/11 0700 In: 240 [P.O.:240] Out: 425 [Urine:425]   Physical Exam:  Cardiovascular: RRR Pulmonary: Diminished right apex, clear on left Wounds: Dressing intact   Chest tube: to 30 cm suction, no air leak  Lab Results: CBC: Recent Labs    10/09/19 0305 10/09/19 1410  WBC 8.2 6.7  HGB 9.4* 9.4*  HCT 29.7* 29.7*  PLT 487* 478*   BMET:  Recent Labs    10/09/19 0305 10/09/19 1410  NA 140 138  K 4.0 4.6  CL 103 104  CO2 27 28  GLUCOSE 106* 105*  BUN 15 13  CREATININE 0.75 0.66  CALCIUM 9.1 9.3    PT/INR:  Recent Labs    10/09/19 1410  LABPROT 13.9  INR 1.1   ABG:  INR: Will add last result for INR, ABG once components are confirmed Will add last 4 CBG results once components are confirmed  Assessment/Plan:  1. CV - SR. 2.  Pulmonary - History of COPD. On 2 liters or oxygen via [place]. IR placed a new pigtail chest tube Saturday as previous one "fell out". Chest tube with scant output last 24 hours , to 30 cm suction. There is no air leak this am;she previously had a persistent air leak. CXR this am appears to show right pneumothorax (stable or slightly decreased). Check CXR in am.[place] to discuss management this am 3. Mental status-mild dementia  history, per primary  Donielle M ZimmermanPA-C 10/10/2019,7:24 AM

## 2019-10-10 NOTE — Progress Notes (Signed)
RN called OR to verify if pt was still going to have bronchoscopy today. They reported procedure was canceled. RN paged Roxan Hockey MD (surgeon who would have done surgery) to verify reason why procedure was canceled. He reported that pt verbally refused procedure when he called room. RN reported that pt's daughter wanted to be apart of decision process as pt has dementia. [place]rickson MD verbalized that he would call daughter to explain the benefits vs burdens of procedure, and why he decided to hold off - besides pt refusing. RN verbalized understanding and will continue to monitor pt.

## 2019-10-10 NOTE — Care Management Important Message (Signed)
Important Message  Patient Details  Name: Amanda [name] MRN: [id number] Date of Birth: 07/31/1941   Medicare Important Message Given:  Yes     [name] Circle 10/10/2019, 2:55 PM

## 2019-10-10 NOTE — Progress Notes (Signed)
      [address]       [place], [address]             [phone]      Amanda [name] is confused this afternoon.  BP 100/62 (BP Location: Right Arm)   Pulse 95   Temp 97.9 F (36.6 C) (Oral)   Resp 20   Ht 5\' 5"  (1.651 m)   Wt 44.4 kg   SpO2 99%   BMI 16.29 kg/m   + air leak, lungs decreased BS bilaterally, no wheezing  CXR this AM showed a space superior and lateral on Right side  When I examined her this morning, the tubing to the pleuravac was clamped. After unclamping she had an air leak, which is unchanged this afternoon.  I spoke with her daughter Amanda [name] and updated her on the issues. She has a persistent air leak in the setting of end stage emphysema. She has had recurrent pneumothoraces. We discussed several options. Conservative management with  The chest tube as we are doing, conversion ot mini express and home with CT, VATS for stapling of blebs, bronch for IBV placement, and talc pleurodesis.  Of these options, I would favor IBV placement. It does not require incisions, should help the leak seal faster and may have the side benefit of some improvement in her respiratory status.  I described the nature of the procedure.  We will plan to do it in the operating room under general anesthesia.  It is endoscopic and does not require any incisions.  This is manual understands the indications, risk, benefits, and alternatives.  She understands the risks include those associated with general anesthesia.  She understands the risk include, but not limited to death, MI, stroke, blood clots, pneumothorax, failure to resolve air leak, as well as the possibility of other unstable complications.  Amanda [name] is concerned that her mother is not strong enough to have the procedure.  I explained that this is generally speaking not a physiologically challenging procedure and she should not have any significant pain associated with that.  In fact, her strength is probably  going to deteriorate more rapidly sitting in the hospital with a chest tube in place that would with IBV placement.  I will talk further with Amanda [name] tomorrow and Amanda [name] will speak with her as well.  Revonda Standard Roxan Hockey, MD [facility] [phone]

## 2019-10-10 NOTE — Progress Notes (Signed)
Physical Therapy Treatment Patient Details Name: Amanda [name] MRN: [id number] DOB: 22-Jun-1941 Today's Date: 10/10/2019    History of Present Illness Pt is a 79 y.o. female with recent COVID(+) last month, admitted 09/29/19 with worsening SOB. Pt with R-side pneumothorax s/p chest tube placement. Also with AMS; mild dementia history per primary. PMH includes multiple spontaneous pneumothorax, COPD, bladder CA. Of note, son recently passed 09/24/19 from [place].    PT Comments    Pt experiencing L side chest discomfort from new CT placement yesterday and quite a bit of anxiety about it coming out which was a distraction for her and seemed to also increase her DOE. Attempted ambulation but pt made it only 5' before feeling too SOB to continue. O2 sats maintained low to mid 90's on 3 L with activity. Performed seated and standing there ex. Also continued to work on sit<>stand transfers. PT will continue to follow.    Follow Up Recommendations  Home health PT;Supervision/Assistance - 24 hour     Equipment Recommendations  Wheelchair (measurements PT);Wheelchair cushion (measurements PT)    Recommendations for Other Services       Precautions / Restrictions Precautions Precautions: Fall;Other (comment) Precaution Comments: watch SpO2 with mobility, R chest tube Restrictions Weight Bearing Restrictions: No    Mobility  Bed Mobility Overal bed mobility: Needs Assistance Bed Mobility: Supine to Sit     Supine to sit: Supervision;HOB elevated     General bed mobility comments: Increased time, use of bed rail, pivoted to L-side  Transfers Overall transfer level: Needs assistance Equipment used: Rolling walker (2 wheeled) Transfers: Sit to/from Stand Sit to Stand: Min guard;Min assist Stand pivot transfers: Min guard       General transfer comment: Practiced sit<>stand 6x from bed and chair. Needed vc's for Q[place] placement first 2x but then began to  remember  Ambulation/Gait Ambulation/Gait assistance: Min assist Gait Distance (Feet): 5 Feet Assistive device: Rolling walker (2 wheeled) Gait Pattern/deviations: Step-to pattern;Trunk flexed;Narrow base of support Gait velocity: decreased Gait velocity interpretation: <1.31 ft/sec, indicative of household ambulator General Gait Details: pt desiring to walk but after 5' became too SOB and needed to return to sitting. Also practiced marching in place so she would have more confidence with chair right behind her.    Stairs             Wheelchair Mobility    Modified Rankin (Stroke Patients Only)       Balance Overall balance assessment: Needs assistance Sitting-balance support: Feet supported;No upper extremity supported Sitting balance-Leahy Scale: Fair     Standing balance support: Bilateral upper extremity supported Standing balance-Leahy Scale: Poor Standing balance comment: dependent on RW                            Cognition Arousal/Alertness: Awake/alert Behavior During Therapy: WFL for tasks assessed/performed;Anxious Overall Cognitive Status: History of cognitive impairments - at baseline Area of Impairment: Orientation;Attention;Memory;Following commands;Awareness;Problem solving                 Orientation Level: Disoriented to;Time;Situation Current Attention Level: Sustained;Selective Memory: Decreased short-term memory Following Commands: Follows one step commands consistently;Follows one step commands with increased time   Awareness: Intellectual Problem Solving: Slow processing;Requires verbal cues General Comments: Very anxious about chest tube coming out, distracting for her. Pt knows that it is 2021 but not the date and often asks if there are others in the room,. Thought that her sister was  still there.       Exercises General Exercises - Lower Extremity Ankle Circles/Pumps: AROM;Both;10 reps;Supine Long Arc Quad:  AROM;Both;10 reps;Seated Heel Slides: AROM;Both;10 reps;Supine Hip Flexion/Marching: AROM;10 reps;Standing Heel Raises: AROM;10 reps;Standing    General Comments General comments (skin integrity, edema, etc.): SpO02 maintained low 90's with activity and 3L, DOE 3/4 with exertion      Pertinent Vitals/Pain Pain Assessment: Faces Faces Pain Scale: Hurts even more Pain Location: R-side abdomen Pain Descriptors / Indicators: Sore Pain Intervention(s): Limited activity within patient's tolerance;Monitored during session    Home Living                      Prior Function            PT Goals (current goals can now be found in the care plan section) Acute Rehab PT Goals Patient Stated Goal: get stronger PT Goal Formulation: With patient Time For Goal Achievement: 10/18/19 Potential to Achieve Goals: Fair Progress towards PT goals: Progressing toward goals    Frequency    Min 3X/week      PT Plan Current plan remains appropriate    Co-evaluation              AM-PAC PT "6 Clicks" Mobility   Outcome Measure  Help needed turning from your back to your side while in a flat bed without using bedrails?: None Help needed moving from lying on your back to sitting on the side of a flat bed without using bedrails?: A Little Help needed moving to and from a bed to a chair (including a wheelchair)?: A Little Help needed standing up from a chair using your arms (e.g., wheelchair or bedside chair)?: A Little Help needed to walk in hospital room?: A Little Help needed climbing 3-5 steps with a railing? : A Lot 6 Click Score: 18    End of Session Equipment Utilized During Treatment: Oxygen Activity Tolerance: Patient tolerated treatment well;Patient limited by fatigue Patient left: in chair;with call bell/phone within reach;with chair alarm set Nurse Communication: Mobility status PT Visit Diagnosis: Muscle weakness (generalized) (M62.81);Difficulty in walking, not  elsewhere classified (R26.2)     Time: [id number] PT Time Calculation (min) (ACUTE ONLY): 36 min  Charges:  $Therapeutic Exercise: 8-22 mins $Therapeutic Activity: 8-22 mins                     [place]  Pager [phone] [place] 10/10/2019, 12:48 PM

## 2019-10-10 NOTE — Progress Notes (Signed)
PROGRESS NOTE    Amanda [name]    Code Status: Partial Code  [id number] DOB: 30-Jul-1941 DOA: 09/29/2019  PCP: Lemmie Evens, MD    Hospital Summary  Per Dr. Daphene Jaeger Note:  This is a 79 year old female W/ severe COPD and ,ild dementia admitted w/ 3rd or 4th spontaneous PTX in past year. Recently had Covid-19 08/30/19) and son just died from it. Had chest tube placed in R in ED 12/31 w/ improvement in dyspnea and PTX. Patient seen by cardiothoracic surgery. Patient has chest tube in place on the right lung remains fully expanded on repeat chest x-ray 1/4. 1/6-chest tube to waterseal-repeat chest x-ray showing worsening pneumothorax chest tube placed back to suction 1/9-chest tube was dislodged but patient not in distress toTCTS calledIR to place PIG tailcatheter which was done 1/9 PM 1/10: Suction increased 1/11: Initially planned for IBV valve has been canceled at patient's request  A & P   Principal Problem:   Recurrent spontaneous pneumothorax Active Problems:   SOB (shortness of breath)   Spontaneous pneumothorax   Dementia (HCC)   Protein-calorie malnutrition, severe   Emphysema/COPD (HCC)   Hypokalemia   1. Recurrent spontaneous pneumothorax a. Initially planned IBV valve for this a.m. has been canceled at patient's request b. CXR stable right-sided pneumothorax with bibasilar airspace disease R>L and small bilateral pleural effusions R>L c. Appreciate CT surgery recommendations 2. Mild dementia with sundowning in setting of suspected polypharmacy a. UA, TSH, ammonia, B12 stable b. Has been on chronic benzos and scheduled oxycodone which were changed to as needed by previous MD and tramadol was ordered c. Continue medication reconciliation as needed 3. Severe emphysema/COPD/chronic hypoxic respiratory failure on 3 L nasal cannula at home a. Continue Symbicort and albuterol 4. Hyperkalemia a. No labs from today 5. History of COVID-19 a. PCR and antigen  negative on admission  DVT prophylaxis: Lovenox Family Communication: CT surgery to discuss with daughter Disposition Plan: Barrier to discharge is planned for pneumothorax  Consultants  Cardiothoracic surgery Interventional radiology  Procedures  1/6-chest tube to waterseal-repeat chest x-ray showing worsening pneumothorax chest tube placed back to suction 1/9-chest tube was dislodged but patient not in distress toTCTS calledIR to place PIG tailcatheter which was done 1/9 PM  Antibiotics   Anti-infectives (From admission, onward)   None           Subjective   Patient seen and examined at bedside in no acute distress and resting comfortably.  States that she does not wish to proceed with her scheduled procedure today.  Nurse reports that she has notified the surgeon of this change. Denies any acute complaints at this time.   Objective   Vitals:   10/10/19 0300 10/10/19 0751 10/10/19 0806 10/10/19 1222  BP: (!) 105/57  (!) 106/54 100/62  Pulse: 97  97 95  Resp: 20  16 20   Temp: 98 F (36.7 C)  97.8 F (36.6 C) 97.9 F (36.6 C)  TempSrc: Oral  Oral Oral  SpO2: 97% 99% 96% 99%  Weight:      Height:        Intake/Output Summary (Last 24 hours) at 10/10/2019 1527 Last data filed at 10/10/2019 0300 Gross per 24 hour  Intake --  Output 225 ml  Net -225 ml   Filed Weights   09/29/19 1401 09/30/19 0005 10/04/19 0303  Weight: 47.6 kg 47 kg 44.4 kg    Examination:  Physical Exam Vitals and nursing note reviewed.  Constitutional:  General: She is not in acute distress.    Comments: Frail elderly female  HENT:     Head: Normocephalic.  Cardiovascular:     Rate and Rhythm: Normal rate and regular rhythm.  Pulmonary:     Effort: Pulmonary effort is normal. No tachypnea.     Comments: On nasal cannula Crackles in the right upper lobe Musculoskeletal:     Right lower leg: No edema.     Left lower leg: No edema.  Neurological:     General: No focal  deficit present.     Mental Status: She is alert.  Psychiatric:        Mood and Affect: Mood normal.     Data Reviewed: I have personally reviewed following labs and imaging studies  CBC: Recent Labs  Lab 10/04/19 0452 10/05/19 0343 10/07/19 0253 10/09/19 0305 10/09/19 1410  WBC 8.4 7.9 7.4 8.2 6.7  HGB 9.8* 9.5* 9.6* 9.4* 9.4*  HCT 30.1* 29.7* 30.6* 29.7* 29.7*  MCV 93.8 95.2 95.0 94.9 94.0  PLT 418* 439* 481* 487* [id number]*   Basic Metabolic Panel: Recent Labs  Lab 10/04/19 1237 10/05/19 0343 10/06/19 0400 10/07/19 0253 10/09/19 0305 10/09/19 1410  NA 136 136  --  140 140 138  K 4.3 4.5  --  4.3 4.0 4.6  CL 98 100  --  102 103 104  CO2 27 25  --  28 27 28   GLUCOSE 110* 94  --  118* 106* 105*  BUN 14 13  --  12 15 13   CREATININE 0.72 0.70 0.76 0.74 0.75 0.66  CALCIUM 9.2 8.9  --  9.1 9.1 9.3   GFR: Estimated Creatinine Clearance: 40.6 mL/min (by C-G formula based on SCr of 0.66 mg/dL). Liver Function Tests: Recent Labs  Lab 10/07/19 0253 10/09/19 1410  AST 23 21  ALT 21 21  ALKPHOS 60 61  BILITOT 0.3 0.5  PROT 6.1* 6.2*  ALBUMIN 2.2* 2.1*   No results for input(s): LIPASE, AMYLASE in the last 168 hours. Recent Labs  Lab 10/06/19 1701  AMMONIA 16   Coagulation Profile: Recent Labs  Lab 10/08/19 1237 10/09/19 1410  INR 1.0 1.1   Cardiac Enzymes: No results for input(s): CKTOTAL, CKMB, CKMBINDEX, TROPONINI in the last 168 hours. BNP (last 3 results) No results for input(s): PROBNP in the last 8760 hours. HbA1C: No results for input(s): HGBA1C in the last 72 hours. CBG: No results for input(s): GLUCAP in the last 168 hours. Lipid Profile: No results for input(s): CHOL, HDL, LDLCALC, TRIG, CHOLHDL, LDLDIRECT in the last 72 hours. Thyroid Function Tests: No results for input(s): TSH, T4TOTAL, FREET4, T3FREE, THYROIDAB in the last 72 hours. Anemia Panel: No results for input(s): VITAMINB12, FOLATE, FERRITIN, TIBC, IRON, RETICCTPCT in the last 72  hours. Sepsis Labs: No results for input(s): PROCALCITON, LATICACIDVEN in the last 168 hours.  Recent Results (from the past 240 hour(s))  Surgical pcr screen     Status: None   Collection Time: 10/10/19 12:00 AM   Specimen: Nasal Mucosa; Nasal Swab  Result Value Ref Range Status   MRSA, PCR NEGATIVE NEGATIVE Final   Staphylococcus aureus NEGATIVE NEGATIVE Final    Comment: (NOTE) The Xpert SA Assay (FDA approved for NASAL specimens in patients 15 years of age and older), is one component of a comprehensive surveillance program. It is not intended to diagnose infection nor to guide or monitor treatment. Performed at [facility], [place] [address] [place], [place] [address]  Radiology Studies: DG Chest 1V REPEAT Same Day  Result Date: 10/10/2019 CLINICAL DATA:  Right-sided pneumothorax. EXAM: CHEST - 1 VIEW SAME DAY COMPARISON:  Chest x-ray 10/10/2019 at 6:59 a.m. FINDINGS: Right-sided pneumothorax is not significantly changed. Pigtail catheter chest tube is in place. Small effusions remain, right greater than left. Bibasilar airspace opacities are present, right greater than left. Atherosclerotic calcifications are noted at the aortic arch. Pulmonary fibrosis and changes of COPD are noted. IMPRESSION: 1. Stable right-sided pneumothorax. 2. Stable bibasilar airspace disease, right greater than left. 3. Small bilateral pleural effusions, right greater than left. Electronically Signed   By: San Morelle M.D.   On: 10/10/2019 13:10   DG CHEST PORT 1 VIEW  Result Date: 10/10/2019 CLINICAL DATA:  Right-sided pneumothorax EXAM: PORTABLE CHEST 1 VIEW COMPARISON:  October 09, 2019 FINDINGS: Chest tube remains on the right, unchanged in position. There is a right apical and apicolateral pneumothorax, stable in size, without appreciable tension component. There is scarring in the right upper lobe. There is fibrosis throughout the lungs bilaterally. There is chronic blunting  of the right costophrenic angle with questionable small pleural effusion on the right. The heart size is normal. Pulmonary vascularity reflects underlying emphysematous change in is stable. There is aortic atherosclerosis. No adenopathy. Bones are osteoporotic. IMPRESSION: Chest tube on the right with persistent, stable pneumothorax in the apical and apicolateral regions. Localized scarring in the right upper lobe persists. Underlying fibrosis without new opacity. Question small right pleural effusion. Stable cardiac silhouette. No adenopathy evident. Aortic Atherosclerosis (ICD10-I70.0). Electronically Signed   By: Lowella Grip III M.D.   On: 10/10/2019 08:07   DG CHEST PORT 1 VIEW  Result Date: 10/09/2019 CLINICAL DATA:  Right pneumothorax. EXAM: PORTABLE CHEST 1 VIEW COMPARISON:  10/08/2019 FINDINGS: The cardiomediastinal silhouette is unchanged with normal heart size. A new pigtail right chest tube has been placed. There is a small right-sided pneumothorax predominantly located laterally in the upper hemithorax which is slightly larger than on yesterday's chest radiograph and significantly larger than on the yesterday's procedural CT images following chest tube placement. The lungs remain hyperinflated with similar appearance of widespread coarse interstitial densities and asymmetric left apical pleuroparenchymal scarring. There is a persistent small right pleural effusion. Mild bibasilar atelectasis and or scarring is also unchanged. IMPRESSION: Right chest tube in place with small right pneumothorax, larger than on yesterday's postprocedural CT images following chest tube placement. Electronically Signed   By: Logan Bores M.D.   On: 10/09/2019 09:03   CT IMAGE GUIDED DRAINAGE BY PERCUTANEOUS CATHETER  Result Date: 10/08/2019 INDICATION: 79 year old with history of a right pneumothorax and recently dislodged chest tube. Patient has recurrent small pneumothorax and request for new chest tube  placement. EXAM: CT-GUIDED RIGHT CHEST TUBE PLACEMENT MEDICATIONS: Moderate sedation ANESTHESIA/SEDATION: 1.0 mg IV Versed 50 mcg IV Fentanyl Moderate Sedation Time:  12 minutes The patient was continuously monitored during the procedure by the interventional radiology nurse under my direct supervision. COMPLICATIONS: None immediate. TECHNIQUE: Informed written consent was obtained from the patient after a thorough discussion of the procedural risks, benefits and alternatives. All questions were addressed. Maximal Sterile Barrier Technique was utilized including caps, mask, sterile gowns, sterile gloves, sterile drape, Morga hygiene and skin antiseptic. A timeout was performed prior to the initiation of the procedure. PROCEDURE: Patient was placed supine on the CT scanner. Images through the chest were obtained. Right pneumothorax was identified and targeted. The right axillary region was prepped with chlorhexidine and sterile field was  created. Skin and soft tissues were anesthetized with 1% lidocaine. Using CT guidance, a Yueh catheter was directed into the pleural space and air was aspirated. Stiff Amplatz wire was advanced into the pleural space. The tract was dilated to accommodate a 14 [place] multipurpose drain. Follow up CT images demonstrated decreased size of the pneumothorax after the chest tube was placed to wall suction. Catheter was sutured in place and secured to skin with a StatLock device. Bandage was placed over the chest tube. FINDINGS: Severe emphysema with a small irregular pneumothorax in the right upper chest. No significant lymphadenopathy. Trace right pleural fluid. Chest tube was placed in the right upper axillary region. Tip of the tube is near the anterior apex of the right lung. Pneumothorax had markedly decreased in size following placement of the chest tube. IMPRESSION: CT-guided placement of a right chest tube for the residual right pneumothorax. Electronically Signed   By: Markus Daft  M.D.   On: 10/08/2019 18:10        Scheduled Meds: . ALPRAZolam  0.5 mg Oral Daily  . docusate sodium  100 mg Oral BID  . enoxaparin (LOVENOX) injection  40 mg Subcutaneous Q24H  . feeding supplement  1 Container Oral TID BM  . lidocaine-EPINEPHrine  20 mL Intradermal Once  . mouth rinse  15 mL Mouth Rinse BID  . mometasone-formoterol  2 puff Inhalation BID  . multivitamin with minerals  1 tablet Oral Daily  . senna  1 tablet Oral Daily  . sodium chloride flush  3 mL Intravenous Q12H   Continuous Infusions: . sodium chloride       LOS: 11 days    Time spent: 20 minutes with over 50% of the time coordinating the patient's care    Harold Hedge, DO [facility] Pager [phone]  If 7PM-7AM, please contact night-coverage [email or web address] Password [facility] 10/10/2019, 3:27 PM

## 2019-10-10 NOTE — Progress Notes (Signed)
Referring Physician(s): Paradis  Supervising Physician: Markus Daft  Patient Status:  [facility] - In-pt  Chief Complaint: Right pneumothorax  Subjective: Sitting comfortably in chair.   Difficult assessment due to position in corner of room, under large blankets.  Chest tube appears intact.  No output in PleurEvac, no air leak-- clamped?  Allergies: Ciprofloxacin, Lactose, Levofloxacin, Sulfa antibiotics, Sulfamethoxazole, Aspirin, Cephalexin, Clindamycin, Erythromycin base, Hydromorphone, and Penicillins  Medications: Prior to Admission medications   Medication Sig Start Date End Date Taking? Authorizing Provider  acetaminophen (TYLENOL) 500 MG tablet Take 500-1,000 mg by mouth daily as needed for mild pain or moderate pain.    Yes [provider]  albuterol (PROVENTIL HFA;VENTOLIN HFA) 108 (90 Base) MCG/ACT inhaler Inhale 1-2 puffs into the lungs every 6 (six) hours as needed for wheezing or shortness of breath. 09/29/18  Yes Hayden Rasmussen, MD  ALPRAZolam Duanne Moron) 0.5 MG tablet Take 0.5 mg by mouth daily.    Yes [provider]  Cholecalciferol (VITAMIN D-1000 MAX ST) 1000 units tablet Take 1,000 Units by mouth daily.    Yes [provider]  Multiple Vitamin (MULTIVITAMIN) capsule Take 1 capsule by mouth daily.    Yes [provider]  oxyCODONE-acetaminophen (PERCOCET/ROXICET) 5-325 MG tablet Take 1 tablet by mouth 3 (three) times daily. 09/05/19  Yes [provider]  SYMBICORT 80-4.5 MCG/ACT inhaler Inhale 2 puffs into the lungs 2 (two) times daily. 09/05/19  Yes [provider]  vitamin C (ASCORBIC ACID) 250 MG tablet Take 500 mg by mouth daily.    Yes [provider]     Vital Signs: BP 100/62 (BP Location: Right Arm)   Pulse 95   Temp 97.9 F (36.6 C) (Oral)   Resp 20   Ht 5\' 5"  (1.651 m)   Wt 97 lb 14.2 oz (44.4 kg)   SpO2 99%   BMI 16.29 kg/m   Physical Exam  NAD, alert Chest: R chest tube intact.   No air leak, not to suction.  No output in Pleurvac  Imaging: DG Chest 1V REPEAT Same Day  Result Date: 10/10/2019 CLINICAL DATA:  Right-sided pneumothorax. EXAM: CHEST - 1 VIEW SAME DAY COMPARISON:  Chest x-ray 10/10/2019 at 6:59 a.m. FINDINGS: Right-sided pneumothorax is not significantly changed. Pigtail catheter chest tube is in place. Small effusions remain, right greater than left. Bibasilar airspace opacities are present, right greater than left. Atherosclerotic calcifications are noted at the aortic arch. Pulmonary fibrosis and changes of COPD are noted. IMPRESSION: 1. Stable right-sided pneumothorax. 2. Stable bibasilar airspace disease, right greater than left. 3. Small bilateral pleural effusions, right greater than left. Electronically Signed   By: San Morelle M.D.   On: 10/10/2019 13:10   DG CHEST PORT 1 VIEW  Result Date: 10/10/2019 CLINICAL DATA:  Right-sided pneumothorax EXAM: PORTABLE CHEST 1 VIEW COMPARISON:  October 09, 2019 FINDINGS: Chest tube remains on the right, unchanged in position. There is a right apical and apicolateral pneumothorax, stable in size, without appreciable tension component. There is scarring in the right upper lobe. There is fibrosis throughout the lungs bilaterally. There is chronic blunting of the right costophrenic angle with questionable small pleural effusion on the right. The heart size is normal. Pulmonary vascularity reflects underlying emphysematous change in is stable. There is aortic atherosclerosis. No adenopathy. Bones are osteoporotic. IMPRESSION: Chest tube on the right with persistent, stable pneumothorax in the apical and apicolateral regions. Localized scarring in the right upper lobe persists. Underlying fibrosis without new  opacity. Question small right pleural effusion. Stable cardiac silhouette. No adenopathy evident. Aortic Atherosclerosis (ICD10-I70.0). Electronically Signed   By: Lowella Grip III M.D.   On: 10/10/2019 08:07    DG CHEST PORT 1 VIEW  Result Date: 10/09/2019 CLINICAL DATA:  Right pneumothorax. EXAM: PORTABLE CHEST 1 VIEW COMPARISON:  10/08/2019 FINDINGS: The cardiomediastinal silhouette is unchanged with normal heart size. A new pigtail right chest tube has been placed. There is a small right-sided pneumothorax predominantly located laterally in the upper hemithorax which is slightly larger than on yesterday's chest radiograph and significantly larger than on the yesterday's procedural CT images following chest tube placement. The lungs remain hyperinflated with similar appearance of widespread coarse interstitial densities and asymmetric left apical pleuroparenchymal scarring. There is a persistent small right pleural effusion. Mild bibasilar atelectasis and or scarring is also unchanged. IMPRESSION: Right chest tube in place with small right pneumothorax, larger than on yesterday's postprocedural CT images following chest tube placement. Electronically Signed   By: Logan Bores M.D.   On: 10/09/2019 09:03   DG CHEST PORT 1 VIEW  Result Date: 10/08/2019 CLINICAL DATA:  Pneumothorax. EXAM: PORTABLE CHEST 1 VIEW COMPARISON:  10/07/2019 FINDINGS: The cardiomediastinal silhouette is unchanged with normal heart size. The lungs remain hyperinflated with chronic widespread interstitial opacities and asymmetric left apical pleuroparenchymal scarring. The right chest tube has been removed, and a small right pneumothorax has enlarged. There is unchanged blunting of the right costophrenic angle which may represent a trace pleural effusion. IMPRESSION: Interval enlargement of small right pneumothorax following chest tube removal. Electronically Signed   By: Logan Bores M.D.   On: 10/08/2019 10:04   DG CHEST PORT 1 VIEW  Result Date: 10/07/2019 CLINICAL DATA:  Chest tube. EXAM: PORTABLE CHEST 1 VIEW COMPARISON:  10/06/2019.  10/05/2019.  06/25/2018. FINDINGS: Right chest tube in stable position. Right pneumothorax has  improved from prior exam with mild residual. Chronic bilateral interstitial changes are again noted. Superimposed active interstitial process cannot be excluded. Stable bilateral pleural thickening. Heart size stable. IMPRESSION: 1. Right chest tube in stable position. Right pneumothorax is improved from prior exam with mild residual. 2. Chronic bilateral interstitial changes are again noted. Superimposed active interstitial process cannot be excluded. No interim change from prior exam. Electronically Signed   By: Marcello Moores  Register   On: 10/07/2019 06:33   CT IMAGE GUIDED DRAINAGE BY PERCUTANEOUS CATHETER  Result Date: 10/08/2019 INDICATION: 79 year old with history of a right pneumothorax and recently dislodged chest tube. Patient has recurrent small pneumothorax and request for new chest tube placement. EXAM: CT-GUIDED RIGHT CHEST TUBE PLACEMENT MEDICATIONS: Moderate sedation ANESTHESIA/SEDATION: 1.0 mg IV Versed 50 mcg IV Fentanyl Moderate Sedation Time:  12 minutes The patient was continuously monitored during the procedure by the interventional radiology nurse under my direct supervision. COMPLICATIONS: None immediate. TECHNIQUE: Informed written consent was obtained from the patient after a thorough discussion of the procedural risks, benefits and alternatives. All questions were addressed. Maximal Sterile Barrier Technique was utilized including caps, mask, sterile gowns, sterile gloves, sterile drape, Kutscher hygiene and skin antiseptic. A timeout was performed prior to the initiation of the procedure. PROCEDURE: Patient was placed supine on the CT scanner. Images through the chest were obtained. Right pneumothorax was identified and targeted. The right axillary region was prepped with chlorhexidine and sterile field was created. Skin and soft tissues were anesthetized with 1% lidocaine. Using CT guidance, a Yueh catheter was directed into the pleural space and air was aspirated. Stiff Amplatz  wire was  advanced into the pleural space. The tract was dilated to accommodate a 14 [place] multipurpose drain. Follow up CT images demonstrated decreased size of the pneumothorax after the chest tube was placed to wall suction. Catheter was sutured in place and secured to skin with a StatLock device. Bandage was placed over the chest tube. FINDINGS: Severe emphysema with a small irregular pneumothorax in the right upper chest. No significant lymphadenopathy. Trace right pleural fluid. Chest tube was placed in the right upper axillary region. Tip of the tube is near the anterior apex of the right lung. Pneumothorax had markedly decreased in size following placement of the chest tube. IMPRESSION: CT-guided placement of a right chest tube for the residual right pneumothorax. Electronically Signed   By: Markus Daft M.D.   On: 10/08/2019 18:10    Labs:  CBC: Recent Labs    10/05/19 0343 10/07/19 0253 10/09/19 0305 10/09/19 1410  WBC 7.9 7.4 8.2 6.7  HGB 9.5* 9.6* 9.4* 9.4*  HCT 29.7* 30.6* 29.7* 29.7*  PLT 439* 481* 487* 478*    COAGS: Recent Labs    10/08/19 1237 10/09/19 1410  INR 1.0 1.1    BMP: Recent Labs    10/05/19 0343 10/06/19 0400 10/07/19 0253 10/09/19 0305 10/09/19 1410  NA 136  --  140 140 138  K 4.5  --  4.3 4.0 4.6  CL 100  --  102 103 104  CO2 25  --  28 27 28   GLUCOSE 94  --  118* 106* 105*  BUN 13  --  12 15 13   CALCIUM 8.9  --  9.1 9.1 9.3  CREATININE 0.70 0.76 0.74 0.75 0.66  GFRNONAA >60 >60 >60 >60 >60  GFRAA >60 >60 >60 >60 >60    LIVER FUNCTION TESTS: Recent Labs    10/01/19 0256 10/07/19 0253 10/09/19 1410  BILITOT 0.6 0.3 0.5  AST 24 23 21   ALT 34 21 21  ALKPHOS 60 60 61  PROT 6.1* 6.1* 6.2*  ALBUMIN 2.1* 2.2* 2.1*    Assessment and Plan: Right pneumothorax s/p chest tube placement 1/9 by Dr. Anselm Pancoast Patient originally had chest tube placed in ED 12/31, dislodged 1/9 and replaced in IR.  Has had persistent air leak since initial placement. CXR  shows persistent PTX. Management per TCTS.  Note consideration for IBV placement if patient/family agreeable.   IR following.  Electronically Signed: Docia Barrier, PA 10/10/2019, 4:39 PM   I spent a total of 15 minutes at the the patient's bedside AND on the patient's hospital floor or unit, greater than 50% of which was counseling/coordinating care for right chest tube

## 2019-10-11 ENCOUNTER — Inpatient Hospital Stay (HOSPITAL_COMMUNITY): Payer: Medicare Other

## 2019-10-11 LAB — BASIC METABOLIC PANEL
Anion gap: 11 (ref 5–15)
BUN: 13 mg/dL (ref 8–23)
CO2: 28 mmol/L (ref 22–32)
Calcium: 9.6 mg/dL (ref 8.9–10.3)
Chloride: 102 mmol/L (ref 98–111)
Creatinine, Ser: 0.84 mg/dL (ref 0.44–1.00)
GFR calc Af Amer: >60 mL/min (ref >60)
GFR calc non Af Amer: >60 mL/min (ref >60)
Glucose, Bld: 97 mg/dL (ref 70–99)
Potassium: 4.6 mmol/L (ref 3.5–5.1)
Sodium: 141 mmol/L (ref 135–145)

## 2019-10-11 LAB — CBC
HCT: 31.9 % — ABNORMAL LOW (ref 36.0–46.0)
Hemoglobin: 9.9 g/dL — ABNORMAL LOW (ref 12.0–15.0)
MCH: 29.9 pg (ref 26.0–34.0)
MCHC: 31 g/dL (ref 30.0–36.0)
MCV: 96.4 fL (ref 80.0–100.0)
Platelets: 503 10*3/uL — ABNORMAL HIGH (ref 150–400)
RBC: 3.31 MIL/uL — ABNORMAL LOW (ref 3.87–5.11)
RDW: 14.5 % (ref 11.5–15.5)
WBC: 7 10*3/uL (ref 4.0–10.5)
nRBC: 0 % (ref 0.0–0.2)

## 2019-10-11 LAB — MAGNESIUM: Magnesium: 2.1 mg/dL (ref 1.7–2.4)

## 2019-10-11 LAB — PHOSPHORUS: Phosphorus: 4.4 mg/dL (ref 2.5–4.6)

## 2019-10-11 NOTE — H&P (View-Only) (Signed)
      [address]       [place] [address]             [phone]       Procedure(s) (LRB): VIDEO BRONCHOSCOPY WITH ENDOBRONCHIAL NAVIGATION (N/A) IBV REPLACEMENT (N/A)  Subjective:  Up in bed.  No new complaints.  States she thinks she having her procedure later this week  Objective: Vital signs in last 24 hours: Temp:  [97.5 F (36.4 C)-98.4 F (36.9 C)] 98.3 F (36.8 C) (01/12 0412) Pulse Rate:  [91-99] 92 (01/12 0503) Cardiac Rhythm: Normal sinus rhythm (01/12 0700) Resp:  [20-29] 21 (01/12 0503) BP: (87-119)/(54-71) 115/58 (01/12 0412) SpO2:  [92 %-99 %] 95 % (01/12 0503)  Intake/Output from previous day: 01/11 0701 - 01/12 0700 In: -  Out: 720 [Urine:720] Intake/Output this shift: Total I/O In: 3 [I.V.:3] Out: -   General appearance: alert, cooperative and no distress Heart: regular rate and rhythm Lungs: clear to auscultation bilaterally Wound: clean and dry  Lab Results: Recent Labs    10/09/19 1410 10/11/19 0216  WBC 6.7 7.0  HGB 9.4* 9.9*  HCT 29.7* 31.9*  PLT 478* 503*   BMET:  Recent Labs    10/09/19 1410 10/11/19 0216  NA 138 141  K 4.6 4.6  CL 104 102  CO2 28 28  GLUCOSE 105* 97  BUN 13 13  CREATININE 0.66 0.84  CALCIUM 9.3 9.6    PT/INR:  Recent Labs    10/09/19 1410  LABPROT 13.9  INR 1.1   ABG No results found for: PHART, HCO3, TCO2, ACIDBASEDEF, O2SAT CBG (last 3)  No results for input(s): GLUCAP in the last 72 hours.  Assessment/Plan: S/P Procedure(s) (LRB): VIDEO BRONCHOSCOPY WITH ENDOBRONCHIAL NAVIGATION (N/A) IBV REPLACEMENT (N/A)  1. CV- NSR 2. Pulm- + air leak from chest tube on water seal, CXR remains stable in appearance... leave in place today 3. Dispo- patient stable, CXR stable, continued air leak, likely for bronchial valves at some point... Dr. Roxan Hockey is working with family to decide on procedure   LOS: 12 days    Ellwood Handler, PA-C  10/11/2019  Air leak is unchanged to  possibly slightly larger, definitely not improved I spoke with Mrs. Peery this morning re: IBV placement. She said she wanted to wait until tube was out prior to having IBV placed. I explained that the leak is unlikely to stop any time soon without IBV placement. She understands this is an endoscopic procedure and does not involve any additional incisions. I informed her of the indications, risks, benefits and alternatives. She now says she is agreeable to IBV placement. Will see if we can get it on the schedule for tomorrow.  Revonda Standard Roxan Hockey, MD [facility] [phone]

## 2019-10-11 NOTE — Progress Notes (Signed)
PROGRESS NOTE    Amanda [name]    Code Status: Partial Code  [id number] DOB: [birth year] DOA: 09/29/2019  PCP: Lemmie Evens, MD    Hospital Summary  Per Dr. Daphene Jaeger Note:  This is a 79 year old female W/ severe COPD and ,ild dementia admitted w/ 3rd or 4th spontaneous PTX in past year. Recently had Covid-19 09-07-2019) and son just died from it. Had chest tube placed in R in ED 12/31 w/ improvement in dyspnea and PTX. Patient seen by cardiothoracic surgery. Patient has chest tube in place on the right lung remains fully expanded on repeat chest x-ray 1/4. 1/6-chest tube to waterseal-repeat chest x-ray showing worsening pneumothorax chest tube placed back to suction 1/9-chest tube was dislodged but patient not in distress toTCTS calledIR to place PIG tailcatheter which was done 1/9 PM 1/10: Suction increased 1/11: Initially planned for IBV valve has been canceled at patient's request 1/12 CT surgery discussed procedure with daughter and patient plans to proceed with IBV valve, possibly tomorrow  A & P   Principal Problem:   Recurrent spontaneous pneumothorax Active Problems:   SOB (shortness of breath)   Spontaneous pneumothorax   Dementia (HCC)   Protein-calorie malnutrition, severe   Emphysema/COPD (HCC)   Hypokalemia   1. Recurrent spontaneous pneumothorax a. On 2L  b. Initially planned IBV valve for 11/1, canceled at patient's request c. CT surgery discussed options with patient, agreeable for IV placement, possible procedure tomorrow 2. Mild dementia with sundowning in setting of suspected polypharmacy a. UA, TSH, ammonia, B12 stable b. Has been on chronic benzos and scheduled oxycodone which were changed to as needed by previous MD and tramadol was ordered c. Continue medication reconciliation as needed 3. Severe emphysema/COPD/chronic hypoxic respiratory failure on 3 L nasal cannula at home a. Continue Symbicort and albuterol 4. Hyperkalemia  resolved 5. Chronic normocytic anemia stable 6. History of COVID-19 a. PCR and antigen negative on admission  DVT prophylaxis: Lovenox Family Communication: CT surgery to discuss with daughter Disposition Plan: Barrier to discharge is planned for pneumothorax  Consultants  Cardiothoracic surgery Interventional radiology  Procedures  1/6-chest tube to waterseal-repeat chest x-ray showing worsening pneumothorax chest tube placed back to suction 1/9-chest tube was dislodged but patient not in distress toTCTS calledIR to place PIG tailcatheter which was done 1/9 PM  Antibiotics   Anti-infectives (From admission, onward)   None           Subjective   Patient seen and examined at bedside no acute distress and resting comfortably.  No events overnight.  Tolerating diet.  Nurse at bedside.  Denies any chest pain, shortness of breath, fever, nausea, vomiting, urinary complaints. Otherwise ROS negative   Objective   Vitals:   10/11/19 0133 10/11/19 0210 10/11/19 0412 10/11/19 0503  BP:   (!) 115/58   Pulse: 92 97 99 92  Resp: 20 (!) 21 (!) 23 (!) 21  Temp:   98.3 F (36.8 C)   TempSrc:   Oral   SpO2: 96% 92% 99% 95%  Weight:      Height:        Intake/Output Summary (Last 24 hours) at 10/11/2019 0724 Last data filed at 10/11/2019 0459 Gross per 24 hour  Intake --  Output 720 ml  Net -720 ml   Filed Weights   09/29/19 1401 09/30/19 0005 10/04/19 0303  Weight: 47.6 kg 47 kg 44.4 kg    Examination:  Physical Exam Vitals and nursing note reviewed.  Constitutional:  Appearance: She is not ill-appearing.  HENT:     Head: Normocephalic.  Cardiovascular:     Rate and Rhythm: Normal rate and regular rhythm.  Pulmonary:     Effort: Pulmonary effort is normal. No tachypnea.     Comments: Left-sided rales in upper and lower lung zones Musculoskeletal:     Right lower leg: No edema.     Left lower leg: No edema.  Neurological:     General: No focal deficit  present.     Mental Status: She is alert.  Psychiatric:        Mood and Affect: Mood normal.        Behavior: Behavior normal.     Data Reviewed: I have personally reviewed following labs and imaging studies  CBC: Recent Labs  Lab 10/05/19 0343 10/07/19 0253 10/09/19 0305 10/09/19 1410 10/11/19 0216  WBC 7.9 7.4 8.2 6.7 7.0  HGB 9.5* 9.6* 9.4* 9.4* 9.9*  HCT 29.7* 30.6* 29.7* 29.7* 31.9*  MCV 95.2 95.0 94.9 94.0 96.4  PLT 439* 481* 487* 478* [id number]*   Basic Metabolic Panel: Recent Labs  Lab 10/05/19 0343 10/06/19 0400 10/07/19 0253 10/09/19 0305 10/09/19 1410 10/11/19 0216  NA 136  --  140 140 138 141  K 4.5  --  4.3 4.0 4.6 4.6  CL 100  --  102 103 104 102  CO2 25  --  28 27 28 28   GLUCOSE 94  --  118* 106* 105* 97  BUN 13  --  12 15 13 13   CREATININE 0.70 0.76 0.74 0.75 0.66 0.84  CALCIUM 8.9  --  9.1 9.1 9.3 9.6  MG  --   --   --   --   --  2.1  PHOS  --   --   --   --   --  4.4   GFR: Estimated Creatinine Clearance: 38.7 mL/min (by C-G formula based on SCr of 0.84 mg/dL). Liver Function Tests: Recent Labs  Lab 10/07/19 0253 10/09/19 1410  AST 23 21  ALT 21 21  ALKPHOS 60 61  BILITOT 0.3 0.5  PROT 6.1* 6.2*  ALBUMIN 2.2* 2.1*   No results for input(s): LIPASE, AMYLASE in the last 168 hours. Recent Labs  Lab 10/06/19 1701  AMMONIA 16   Coagulation Profile: Recent Labs  Lab 10/08/19 1237 10/09/19 1410  INR 1.0 1.1   Cardiac Enzymes: No results for input(s): CKTOTAL, CKMB, CKMBINDEX, TROPONINI in the last 168 hours. BNP (last 3 results) No results for input(s): PROBNP in the last 8760 hours. HbA1C: No results for input(s): HGBA1C in the last 72 hours. CBG: No results for input(s): GLUCAP in the last 168 hours. Lipid Profile: No results for input(s): CHOL, HDL, LDLCALC, TRIG, CHOLHDL, LDLDIRECT in the last 72 hours. Thyroid Function Tests: No results for input(s): TSH, T4TOTAL, FREET4, T3FREE, THYROIDAB in the last 72 hours. Anemia  Panel: No results for input(s): VITAMINB12, FOLATE, FERRITIN, TIBC, IRON, RETICCTPCT in the last 72 hours. Sepsis Labs: No results for input(s): PROCALCITON, LATICACIDVEN in the last 168 hours.  Recent Results (from the past 240 hour(s))  Surgical pcr screen     Status: None   Collection Time: 10/10/19 12:00 AM   Specimen: Nasal Mucosa; Nasal Swab  Result Value Ref Range Status   MRSA, PCR NEGATIVE NEGATIVE Final   Staphylococcus aureus NEGATIVE NEGATIVE Final    Comment: (NOTE) The Xpert SA Assay (FDA approved for NASAL specimens in patients 55 years of age and older),  is one component of a comprehensive surveillance program. It is not intended to diagnose infection nor to guide or monitor treatment. Performed at [facility], [place] [address] [place], [place] [address]          Radiology Studies: DG Chest 1V REPEAT Same Day  Result Date: 10/10/2019 CLINICAL DATA:  Right-sided pneumothorax. EXAM: CHEST - 1 VIEW SAME DAY COMPARISON:  Chest x-ray 10/10/2019 at 6:59 a.m. FINDINGS: Right-sided pneumothorax is not significantly changed. Pigtail catheter chest tube is in place. Small effusions remain, right greater than left. Bibasilar airspace opacities are present, right greater than left. Atherosclerotic calcifications are noted at the aortic arch. Pulmonary fibrosis and changes of COPD are noted. IMPRESSION: 1. Stable right-sided pneumothorax. 2. Stable bibasilar airspace disease, right greater than left. 3. Small bilateral pleural effusions, right greater than left. Electronically Signed   By: San Morelle M.D.   On: 10/10/2019 13:10   DG CHEST PORT 1 VIEW  Result Date: 10/10/2019 CLINICAL DATA:  Right-sided pneumothorax EXAM: PORTABLE CHEST 1 VIEW COMPARISON:  October 09, 2019 FINDINGS: Chest tube remains on the right, unchanged in position. There is a right apical and apicolateral pneumothorax, stable in size, without appreciable tension component. There is scarring in  the right upper lobe. There is fibrosis throughout the lungs bilaterally. There is chronic blunting of the right costophrenic angle with questionable small pleural effusion on the right. The heart size is normal. Pulmonary vascularity reflects underlying emphysematous change in is stable. There is aortic atherosclerosis. No adenopathy. Bones are osteoporotic. IMPRESSION: Chest tube on the right with persistent, stable pneumothorax in the apical and apicolateral regions. Localized scarring in the right upper lobe persists. Underlying fibrosis without new opacity. Question small right pleural effusion. Stable cardiac silhouette. No adenopathy evident. Aortic Atherosclerosis (ICD10-I70.0). Electronically Signed   By: Lowella Grip III M.D.   On: 10/10/2019 08:07        Scheduled Meds: . ALPRAZolam  0.5 mg Oral Daily  . docusate sodium  100 mg Oral BID  . enoxaparin (LOVENOX) injection  40 mg Subcutaneous Q24H  . feeding supplement  1 Container Oral TID BM  . lidocaine-EPINEPHrine  20 mL Intradermal Once  . mouth rinse  15 mL Mouth Rinse BID  . mometasone-formoterol  2 puff Inhalation BID  . multivitamin with minerals  1 tablet Oral Daily  . senna  1 tablet Oral Daily  . sodium chloride flush  3 mL Intravenous Q12H   Continuous Infusions: . sodium chloride       LOS: 12 days    Time spent: 16 minutes with over 50% of the time coordinating the patient's care    Harold Hedge, DO [facility] Pager [phone]  If 7PM-7AM, please contact night-coverage [email or web address] Password [facility] 10/11/2019, 7:24 AM

## 2019-10-11 NOTE — Progress Notes (Signed)
Referring Physician(s): Roxan Hockey  Supervising Physician: Markus Daft  Patient Status:  [facility] - In-pt  Chief Complaint: Right pneumothorax  Subjective: No new c/o. States she is having procedure tomorrow. Chart reviewed    Allergies: Ciprofloxacin, Lactose, Levofloxacin, Sulfa antibiotics, Sulfamethoxazole, Aspirin, Cephalexin, Clindamycin, Erythromycin base, Hydromorphone, and Penicillins  Medications:  Current Facility-Administered Medications:  .  0.9 %  sodium chloride infusion, 250 mL, Intravenous, PRN, Norins, Heinz Knuckles, MD .  acetaminophen (TYLENOL) tablet 500-1,000 mg, 500-1,000 mg, Oral, Daily PRN, Norins, Heinz Knuckles, MD, 1,000 mg at 10/07/19 1345 .  albuterol (PROVENTIL) (2.5 MG/3ML) 0.083% nebulizer solution 3 mL, 3 mL, Inhalation, Q6H PRN, Norins, Heinz Knuckles, MD .  ALPRAZolam Duanne Moron) tablet 0.5 mg, 0.5 mg, Oral, Daily, Norins, Heinz Knuckles, MD, 0.5 mg at 10/11/19 [id number] .  docusate sodium (COLACE) capsule 100 mg, 100 mg, Oral, BID, Norins, Heinz Knuckles, MD, 100 mg at 10/11/19 0820 .  enoxaparin (LOVENOX) injection 40 mg, 40 mg, Subcutaneous, Q24H, Norins, Heinz Knuckles, MD, 40 mg at 10/10/19 2154 .  feeding supplement (BOOST / RESOURCE BREEZE) liquid 1 Container, 1 Container, Oral, TID BM, Antonieta Pert, MD, 1 Container at 10/11/19 [phone] .  lidocaine-EPINEPHrine (XYLOCAINE W/EPI) 2 %-1:200000 (PF) injection 20 mL, 20 mL, Intradermal, Once, Noemi Chapel, MD .  MEDLINE mouth rinse, 15 mL, Mouth Rinse, BID, Norins, Heinz Knuckles, MD, 15 mL at 10/11/19 [id number] .  mometasone-formoterol (DULERA) 100-5 MCG/ACT inhaler 2 puff, 2 puff, Inhalation, BID, Norins, Heinz Knuckles, MD, 2 puff at 10/11/19 [id number] .  multivitamin with minerals tablet 1 tablet, 1 tablet, Oral, Daily, Gatha Mayer, MD, 1 tablet at 10/11/19 0820 .  oxyCODONE-acetaminophen (PERCOCET/ROXICET) 5-325 MG per tablet 1 tablet, 1 tablet, Oral, Q8H PRN, Kc, Ramesh, MD .  polyethylene glycol (MIRALAX / GLYCOLAX) packet 17 g, 17 g, Oral,  Daily PRN, Kc, Ramesh, MD, 17 g at 10/11/19 0307 .  senna (SENOKOT) tablet 8.6 mg, 1 tablet, Oral, Daily, Kc, Ramesh, MD, 8.6 mg at 10/11/19 0821 .  sodium chloride flush (NS) 0.9 % injection 3 mL, 3 mL, Intravenous, Q12H, Norins, Heinz Knuckles, MD, 3 mL at 10/11/19 0821 .  sodium chloride flush (NS) 0.9 % injection 3 mL, 3 mL, Intravenous, PRN, Norins, Heinz Knuckles, MD .  traMADol (ULTRAM) tablet 50 mg, 50 mg, Oral, Q8H PRN, Kc, Ramesh, MD, 50 mg at 10/09/19 2346    Vital Signs: BP (!) 107/56 (BP Location: Right Arm)   Pulse 96   Temp 97.8 F (36.6 C) (Oral)   Resp 20   Ht 5\' 5"  (1.651 m)   Wt 44.4 kg   SpO2 97%   BMI 16.29 kg/m   Physical Exam  NAD, alert Chest: R chest tube intact.  +air leak on H2O seal.  No output in Pleurvac  Imaging: DG Chest 1V REPEAT Same Day  Result Date: 10/10/2019 CLINICAL DATA:  Right-sided pneumothorax. EXAM: CHEST - 1 VIEW SAME DAY COMPARISON:  Chest x-ray 10/10/2019 at 6:59 a.m. FINDINGS: Right-sided pneumothorax is not significantly changed. Pigtail catheter chest tube is in place. Small effusions remain, right greater than left. Bibasilar airspace opacities are present, right greater than left. Atherosclerotic calcifications are noted at the aortic arch. Pulmonary fibrosis and changes of COPD are noted. IMPRESSION: 1. Stable right-sided pneumothorax. 2. Stable bibasilar airspace disease, right greater than left. 3. Small bilateral pleural effusions, right greater than left. Electronically Signed   By: San Morelle M.D.   On: 10/10/2019 13:10   DG CHEST PORT 1 VIEW  Result Date: 10/11/2019 CLINICAL DATA:  Pneumothorax, chest tube. EXAM: PORTABLE CHEST 1 VIEW COMPARISON:  Chest radiograph 10/10/2019 FINDINGS: Unchanged position of a right-sided chest tube. A right-sided pneumothorax has not significantly changed. Overlying cardiac monitoring leads. Cardiomediastinal silhouette unchanged. Aortic atherosclerosis. Redemonstrated background pulmonary  fibrosis and changes of COPD. Unchanged small bilateral pleural effusions and bibasilar airspace opacities (greater on the right). IMPRESSION: No significant interval change. Unchanged right-sided pneumothorax. Stable small bilateral pleural effusions with bibasilar airspace disease, greater on the right. Electronically Signed   By: Kellie Simmering DO   On: 10/11/2019 07:51   DG CHEST PORT 1 VIEW  Result Date: 10/10/2019 CLINICAL DATA:  Right-sided pneumothorax EXAM: PORTABLE CHEST 1 VIEW COMPARISON:  October 09, 2019 FINDINGS: Chest tube remains on the right, unchanged in position. There is a right apical and apicolateral pneumothorax, stable in size, without appreciable tension component. There is scarring in the right upper lobe. There is fibrosis throughout the lungs bilaterally. There is chronic blunting of the right costophrenic angle with questionable small pleural effusion on the right. The heart size is normal. Pulmonary vascularity reflects underlying emphysematous change in is stable. There is aortic atherosclerosis. No adenopathy. Bones are osteoporotic. IMPRESSION: Chest tube on the right with persistent, stable pneumothorax in the apical and apicolateral regions. Localized scarring in the right upper lobe persists. Underlying fibrosis without new opacity. Question small right pleural effusion. Stable cardiac silhouette. No adenopathy evident. Aortic Atherosclerosis (ICD10-I70.0). Electronically Signed   By: Lowella Grip III M.D.   On: 10/10/2019 08:07   DG CHEST PORT 1 VIEW  Result Date: 10/09/2019 CLINICAL DATA:  Right pneumothorax. EXAM: PORTABLE CHEST 1 VIEW COMPARISON:  10/08/2019 FINDINGS: The cardiomediastinal silhouette is unchanged with normal heart size. A new pigtail right chest tube has been placed. There is a small right-sided pneumothorax predominantly located laterally in the upper hemithorax which is slightly larger than on yesterday's chest radiograph and significantly larger  than on the yesterday's procedural CT images following chest tube placement. The lungs remain hyperinflated with similar appearance of widespread coarse interstitial densities and asymmetric left apical pleuroparenchymal scarring. There is a persistent small right pleural effusion. Mild bibasilar atelectasis and or scarring is also unchanged. IMPRESSION: Right chest tube in place with small right pneumothorax, larger than on yesterday's postprocedural CT images following chest tube placement. Electronically Signed   By: Logan Bores M.D.   On: 10/09/2019 09:03   DG CHEST PORT 1 VIEW  Result Date: 10/08/2019 CLINICAL DATA:  Pneumothorax. EXAM: PORTABLE CHEST 1 VIEW COMPARISON:  10/07/2019 FINDINGS: The cardiomediastinal silhouette is unchanged with normal heart size. The lungs remain hyperinflated with chronic widespread interstitial opacities and asymmetric left apical pleuroparenchymal scarring. The right chest tube has been removed, and a small right pneumothorax has enlarged. There is unchanged blunting of the right costophrenic angle which may represent a trace pleural effusion. IMPRESSION: Interval enlargement of small right pneumothorax following chest tube removal. Electronically Signed   By: Logan Bores M.D.   On: 10/08/2019 10:04   CT IMAGE GUIDED DRAINAGE BY PERCUTANEOUS CATHETER  Result Date: 10/08/2019 INDICATION: 79 year old with history of a right pneumothorax and recently dislodged chest tube. Patient has recurrent small pneumothorax and request for new chest tube placement. EXAM: CT-GUIDED RIGHT CHEST TUBE PLACEMENT MEDICATIONS: Moderate sedation ANESTHESIA/SEDATION: 1.0 mg IV Versed 50 mcg IV Fentanyl Moderate Sedation Time:  12 minutes The patient was continuously monitored during the procedure by the interventional radiology nurse under my direct supervision. COMPLICATIONS: None immediate. TECHNIQUE:  Informed written consent was obtained from the patient after a thorough discussion of the  procedural risks, benefits and alternatives. All questions were addressed. Maximal Sterile Barrier Technique was utilized including caps, mask, sterile gowns, sterile gloves, sterile drape, Nazaryan hygiene and skin antiseptic. A timeout was performed prior to the initiation of the procedure. PROCEDURE: Patient was placed supine on the CT scanner. Images through the chest were obtained. Right pneumothorax was identified and targeted. The right axillary region was prepped with chlorhexidine and sterile field was created. Skin and soft tissues were anesthetized with 1% lidocaine. Using CT guidance, a Yueh catheter was directed into the pleural space and air was aspirated. Stiff Amplatz wire was advanced into the pleural space. The tract was dilated to accommodate a 14 [place] multipurpose drain. Follow up CT images demonstrated decreased size of the pneumothorax after the chest tube was placed to wall suction. Catheter was sutured in place and secured to skin with a StatLock device. Bandage was placed over the chest tube. FINDINGS: Severe emphysema with a small irregular pneumothorax in the right upper chest. No significant lymphadenopathy. Trace right pleural fluid. Chest tube was placed in the right upper axillary region. Tip of the tube is near the anterior apex of the right lung. Pneumothorax had markedly decreased in size following placement of the chest tube. IMPRESSION: CT-guided placement of a right chest tube for the residual right pneumothorax. Electronically Signed   By: Markus Daft M.D.   On: 10/08/2019 18:10    Labs:  CBC: Recent Labs    10/07/19 0253 10/09/19 0305 10/09/19 1410 10/11/19 0216  WBC 7.4 8.2 6.7 7.0  HGB 9.6* 9.4* 9.4* 9.9*  HCT 30.6* 29.7* 29.7* 31.9*  PLT 481* 487* 478* 503*    COAGS: Recent Labs    10/08/19 1237 10/09/19 1410  INR 1.0 1.1    BMP: Recent Labs    10/07/19 0253 10/09/19 0305 10/09/19 1410 10/11/19 0216  NA 140 140 138 141  K 4.3 4.0 4.6 4.6  CL  102 103 104 102  CO2 28 27 28 28   GLUCOSE 118* 106* 105* 97  BUN 12 15 13 13   CALCIUM 9.1 9.1 9.3 9.6  CREATININE 0.74 0.75 0.66 0.84  GFRNONAA >60 >60 >60 >60  GFRAA >60 >60 >60 >60    LIVER FUNCTION TESTS: Recent Labs    10/01/19 0256 10/07/19 0253 10/09/19 1410  BILITOT 0.6 0.3 0.5  AST 24 23 21   ALT 34 21 21  ALKPHOS 60 60 61  PROT 6.1* 6.1* 6.2*  ALBUMIN 2.1* 2.2* 2.1*    Assessment and Plan: Right pneumothorax s/p chest tube placement 1/9 by Dr. Anselm Pancoast Patient originally had chest tube placed in ED 12/31, dislodged 1/9 and replaced in IR.  Has had persistent air leak since initial placement. Management per TCTS.  Note plans for IBV placement tomorrow.  IR following.  Electronically Signed: Ascencion Dike, PA-C 10/11/2019, 9:37 AM   I spent a total of 15 minutes at the the patient's bedside AND on the patient's hospital floor or unit, greater than 50% of which was counseling/coordinating care for right chest tube

## 2019-10-11 NOTE — Progress Notes (Addendum)
      [address]       [place] [address]             [phone]       Procedure(s) (LRB): VIDEO BRONCHOSCOPY WITH ENDOBRONCHIAL NAVIGATION (N/A) IBV REPLACEMENT (N/A)  Subjective:  Up in bed.  No new complaints.  States she thinks she having her procedure later this week  Objective: Vital signs in last 24 hours: Temp:  [97.5 F (36.4 C)-98.4 F (36.9 C)] 98.3 F (36.8 C) (01/12 0412) Pulse Rate:  [91-99] 92 (01/12 0503) Cardiac Rhythm: Normal sinus rhythm (01/12 0700) Resp:  [20-29] 21 (01/12 0503) BP: (87-119)/(54-71) 115/58 (01/12 0412) SpO2:  [92 %-99 %] 95 % (01/12 0503)  Intake/Output from previous day: 01/11 0701 - 01/12 0700 In: -  Out: 720 [Urine:720] Intake/Output this shift: Total I/O In: 3 [I.V.:3] Out: -   General appearance: alert, cooperative and no distress Heart: regular rate and rhythm Lungs: clear to auscultation bilaterally Wound: clean and dry  Lab Results: Recent Labs    10/09/19 1410 10/11/19 0216  WBC 6.7 7.0  HGB 9.4* 9.9*  HCT 29.7* 31.9*  PLT 478* 503*   BMET:  Recent Labs    10/09/19 1410 10/11/19 0216  NA 138 141  K 4.6 4.6  CL 104 102  CO2 28 28  GLUCOSE 105* 97  BUN 13 13  CREATININE 0.66 0.84  CALCIUM 9.3 9.6    PT/INR:  Recent Labs    10/09/19 1410  LABPROT 13.9  INR 1.1   ABG No results found for: PHART, HCO3, TCO2, ACIDBASEDEF, O2SAT CBG (last 3)  No results for input(s): GLUCAP in the last 72 hours.  Assessment/Plan: S/P Procedure(s) (LRB): VIDEO BRONCHOSCOPY WITH ENDOBRONCHIAL NAVIGATION (N/A) IBV REPLACEMENT (N/A)  1. CV- NSR 2. Pulm- + air leak from chest tube on water seal, CXR remains stable in appearance... leave in place today 3. Dispo- patient stable, CXR stable, continued air leak, likely for bronchial valves at some point... Dr. Roxan Hockey is working with family to decide on procedure   LOS: 12 days    Ellwood Handler, PA-C  10/11/2019  Air leak is unchanged to  possibly slightly larger, definitely not improved I spoke with Mrs. Gutkowski this morning re: IBV placement. She said she wanted to wait until tube was out prior to having IBV placed. I explained that the leak is unlikely to stop any time soon without IBV placement. She understands this is an endoscopic procedure and does not involve any additional incisions. I informed her of the indications, risks, benefits and alternatives. She now says she is agreeable to IBV placement. Will see if we can get it on the schedule for tomorrow.  Revonda Standard Roxan Hockey, MD [facility] [phone]

## 2019-10-11 NOTE — Progress Notes (Signed)
Chest tube to water seal. Per pt and day shift RN surgeon switch chest tube to water seal during the day.   Air leak present, MD aware. Pt condition unchanged overnight.

## 2019-10-12 ENCOUNTER — Inpatient Hospital Stay (HOSPITAL_COMMUNITY): Payer: Medicare Other | Admitting: Certified Registered Nurse Anesthetist

## 2019-10-12 ENCOUNTER — Encounter (HOSPITAL_COMMUNITY)
Admission: EM | Disposition: A | Discharge status: Home Health | Payer: Self-pay | Source: Home / Self Care | Attending: Internal Medicine

## 2019-10-12 ENCOUNTER — Inpatient Hospital Stay (HOSPITAL_COMMUNITY): Payer: Medicare Other

## 2019-10-12 DIAGNOSIS — J9311 Primary spontaneous pneumothorax: Secondary | ICD-10-CM

## 2019-10-12 HISTORY — PX: VIDEO BRONCHOSCOPY WITH INSERTION OF INTERBRONCHIAL VALVE (IBV): SHX6178

## 2019-10-12 LAB — CBC
HCT: 31.2 % — ABNORMAL LOW (ref 36.0–46.0)
Hemoglobin: 10.1 g/dL — ABNORMAL LOW (ref 12.0–15.0)
MCH: 30.2 pg (ref 26.0–34.0)
MCHC: 32.4 g/dL (ref 30.0–36.0)
MCV: 93.4 fL (ref 80.0–100.0)
Platelets: 499 10*3/uL — ABNORMAL HIGH (ref 150–400)
RBC: 3.34 MIL/uL — ABNORMAL LOW (ref 3.87–5.11)
RDW: 14.4 % (ref 11.5–15.5)
WBC: 8.3 10*3/uL (ref 4.0–10.5)
nRBC: 0 % (ref 0.0–0.2)

## 2019-10-12 LAB — BASIC METABOLIC PANEL
Anion gap: 11 (ref 5–15)
BUN: 13 mg/dL (ref 8–23)
CO2: 26 mmol/L (ref 22–32)
Calcium: 9.1 mg/dL (ref 8.9–10.3)
Chloride: 103 mmol/L (ref 98–111)
Creatinine, Ser: 0.73 mg/dL (ref 0.44–1.00)
GFR calc Af Amer: >60 mL/min (ref >60)
GFR calc non Af Amer: >60 mL/min (ref >60)
Glucose, Bld: 113 mg/dL — ABNORMAL HIGH (ref 70–99)
Potassium: 4 mmol/L (ref 3.5–5.1)
Sodium: 140 mmol/L (ref 135–145)

## 2019-10-12 LAB — MAGNESIUM: Magnesium: 2 mg/dL (ref 1.7–2.4)

## 2019-10-12 SURGERY — BRONCHOSCOPY, FLEXIBLE, WITH INTRABRONCHIAL VALVE INSERTION
Anesthesia: General | Site: Chest

## 2019-10-12 MED ORDER — LACTATED RINGERS IV SOLN: INTRAVENOUS | Status: DC | PRN

## 2019-10-12 MED ORDER — 0.9 % SODIUM CHLORIDE (POUR BTL) OPTIME
TOPICAL | Status: DC | PRN
  Administered 2019-10-12: 1000 mL

## 2019-10-12 MED ORDER — OXYCODONE HCL 5 MG/5ML PO SOLN: 5.0000 mg | Freq: Once | ORAL | Status: DC | PRN

## 2019-10-12 MED ORDER — PROPOFOL 10 MG/ML IV BOLUS
INTRAVENOUS | Status: DC | PRN
  Administered 2019-10-12: 100 mg via INTRAVENOUS

## 2019-10-12 MED ORDER — PHENYLEPHRINE 40 MCG/ML (10ML) SYRINGE FOR IV PUSH (FOR BLOOD PRESSURE SUPPORT)
PREFILLED_SYRINGE | INTRAVENOUS | Status: DC | PRN
  Administered 2019-10-12: 80 ug via INTRAVENOUS

## 2019-10-12 MED ORDER — DEXAMETHASONE SODIUM PHOSPHATE 10 MG/ML IJ SOLN
INTRAMUSCULAR | Status: DC | PRN
  Administered 2019-10-12: 4 mg via INTRAVENOUS

## 2019-10-12 MED ORDER — OXYCODONE HCL 5 MG PO TABS: 5.0000 mg | ORAL_TABLET | Freq: Once | ORAL | Status: DC | PRN

## 2019-10-12 MED ORDER — LIDOCAINE 2% (20 MG/ML) 5 ML SYRINGE
INTRAMUSCULAR | Status: AC
  Filled 2019-10-12: qty 5

## 2019-10-12 MED ORDER — LIDOCAINE HCL (CARDIAC) PF 100 MG/5ML IV SOSY
PREFILLED_SYRINGE | INTRAVENOUS | Status: DC | PRN
  Administered 2019-10-12: 50 mg via INTRATRACHEAL

## 2019-10-12 MED ORDER — PHENYLEPHRINE HCL-NACL 10-0.9 MG/250ML-% IV SOLN
INTRAVENOUS | Status: DC | PRN
  Administered 2019-10-12: 25 ug/min via INTRAVENOUS

## 2019-10-12 MED ORDER — PROPOFOL 10 MG/ML IV BOLUS
INTRAVENOUS | Status: AC
  Filled 2019-10-12: qty 40

## 2019-10-12 MED ORDER — ROCURONIUM BROMIDE 10 MG/ML (PF) SYRINGE
PREFILLED_SYRINGE | INTRAVENOUS | Status: DC | PRN
  Administered 2019-10-12: 50 mg via INTRAVENOUS

## 2019-10-12 MED ORDER — DEXAMETHASONE SODIUM PHOSPHATE 10 MG/ML IJ SOLN
INTRAMUSCULAR | Status: AC
  Filled 2019-10-12: qty 1

## 2019-10-12 MED ORDER — FENTANYL CITRATE (PF) 100 MCG/2ML IJ SOLN: 25.0000 ug | INTRAMUSCULAR | Status: DC | PRN

## 2019-10-12 MED ORDER — FENTANYL CITRATE (PF) 250 MCG/5ML IJ SOLN
INTRAMUSCULAR | Status: AC
  Filled 2019-10-12: qty 5

## 2019-10-12 MED ORDER — ONDANSETRON HCL 4 MG/2ML IJ SOLN
INTRAMUSCULAR | Status: AC
  Filled 2019-10-12: qty 2

## 2019-10-12 MED ORDER — ROCURONIUM BROMIDE 10 MG/ML (PF) SYRINGE
PREFILLED_SYRINGE | INTRAVENOUS | Status: AC
  Filled 2019-10-12: qty 10

## 2019-10-12 MED ORDER — ONDANSETRON HCL 4 MG/2ML IJ SOLN: 4.0000 mg | Freq: Four times a day (QID) | INTRAMUSCULAR | Status: DC | PRN

## 2019-10-12 MED ORDER — ONDANSETRON HCL 4 MG/2ML IJ SOLN
INTRAMUSCULAR | Status: DC | PRN
  Administered 2019-10-12: 4 mg via INTRAVENOUS

## 2019-10-12 MED ORDER — EPINEPHRINE PF 1 MG/ML IJ SOLN
INTRAMUSCULAR | Status: AC
  Filled 2019-10-12: qty 1

## 2019-10-12 MED ORDER — LORAZEPAM 2 MG/ML IJ SOLN: 0.5000 mg | INTRAMUSCULAR | Status: DC | PRN

## 2019-10-12 SURGICAL SUPPLY — 44 items
ADAPTER VALVE BIOPSY EBUS (MISCELLANEOUS) IMPLANT
ADPTR VALVE BIOPSY EBUS (MISCELLANEOUS)
BLADE CLIPPER SURG (BLADE) ×3 IMPLANT
CANISTER SUCT 3000ML PPV (MISCELLANEOUS) ×3 IMPLANT
CATH BALLN 4FR (CATHETERS) ×2 IMPLANT
CATH EMB 5FR 80CM (CATHETERS) ×2 IMPLANT
CATH EMB 6FR 80CM (CATHETERS) ×2 IMPLANT
CATH LOADER DEPLOYMENT HUD (CATHETERS) ×2 IMPLANT
CONT SPEC 4OZ CLIKSEAL STRL BL (MISCELLANEOUS) ×3 IMPLANT
COVER BACK TABLE 60X90IN (DRAPES) ×3 IMPLANT
FILTER STRAW FLUID ASPIR (MISCELLANEOUS) IMPLANT
FORCEPS BIOP RJ4 1.8 (CUTTING FORCEPS) IMPLANT
FORCEPS RADIAL JAW LRG 4 PULM (INSTRUMENTS) IMPLANT
GAUZE SPONGE 4X4 12PLY STRL (GAUZE/BANDAGES/DRESSINGS) ×3 IMPLANT
GLOVE SURG SIGNA 7.5 PF LTX (GLOVE) ×3 IMPLANT
GOWN STRL REUS W/ TWL XL LVL3 (GOWN DISPOSABLE) ×1 IMPLANT
GOWN STRL REUS W/TWL XL LVL3 (GOWN DISPOSABLE) ×3
KIT AIRWAY SIZING HUD (KITS) ×2 IMPLANT
KIT CLEAN ENDO COMPLIANCE (KITS) ×3 IMPLANT
KIT TURNOVER KIT B (KITS) ×3 IMPLANT
MARKER SKIN DUAL TIP RULER LAB (MISCELLANEOUS) ×3 IMPLANT
NS IRRIG 1000ML POUR BTL (IV SOLUTION) ×3 IMPLANT
OIL SILICONE PENTAX (PARTS (SERVICE/REPAIRS)) IMPLANT
PAD ARMBOARD 7.5X6 YLW CONV (MISCELLANEOUS) ×6 IMPLANT
RADIAL JAW LRG 4 PULMONARY (INSTRUMENTS) ×2
STOPCOCK 4 WAY LG BORE MALE ST (IV SETS) ×2 IMPLANT
STOPCOCK MORSE 400PSI 3WAY (MISCELLANEOUS) ×3 IMPLANT
SYR 10ML LL (SYRINGE) ×5 IMPLANT
SYR 20ML ECCENTRIC (SYRINGE) ×3 IMPLANT
SYR 5ML LL (SYRINGE) ×2 IMPLANT
TOWEL GREEN STERILE (TOWEL DISPOSABLE) ×3 IMPLANT
TOWEL GREEN STERILE FF (TOWEL DISPOSABLE) ×3 IMPLANT
TOWEL NATURAL 4PK STERILE (DISPOSABLE) ×3 IMPLANT
TRAP SPECIMEN MUCOUS 40CC (MISCELLANEOUS) ×3 IMPLANT
TUBE CONNECTING 20'X1/4 (TUBING) ×1
TUBE CONNECTING 20X1/4 (TUBING) ×2 IMPLANT
UNDERPAD 30X30 (UNDERPADS AND DIAPERS) ×3 IMPLANT
VALVE BIOPSY  SINGLE USE (MISCELLANEOUS) ×2
VALVE BIOPSY SINGLE USE (MISCELLANEOUS) ×1 IMPLANT
VALVE IN CARTRIDGE 6MM HUD (Valve) ×2 IMPLANT
VALVE IN CARTRIDGE 7MM HUD (Valve) ×4 IMPLANT
VALVE IN CARTRIDGE 9MM HUD (Valve) ×4 IMPLANT
VALVE SUCTION BRONCHIO DISP (MISCELLANEOUS) ×3 IMPLANT
WATER STERILE IRR 1000ML POUR (IV SOLUTION) ×3 IMPLANT

## 2019-10-12 NOTE — Progress Notes (Signed)
PROGRESS NOTE  TIYE JIN [id number] DOB: [birth year] DOA: 09/29/2019 PCP: Lemmie Evens, MD   LOS: 13 days   Brief narrative: As per HPI,  This is a 79 year old female with severe COPD and mild dementia admitted to the hospital this time. Patient did have recurrent 3rd or 4th spontaneous PTX in past year. Recently had Covid-19 09/01/2019) and son just died from it. Had chest tube placed in  ED 12/31 with improvement in dyspnea and PTX. Patient was seen by cardiothoracic surgery.  1/6-chest tube to waterseal-repeat chest x-ray showing worsening pneumothorax chest tube placed back to suction 1/9-chest tube was dislodged but patient not in distress toTCTS calledIR to place PIG tailcatheterwhich was done 1/9. 1/10: Suction increased 1/12:  CT surgery-  Status post IBV valve  Assessment/Plan:  Principal Problem:   Recurrent spontaneous pneumothorax Active Problems:   SOB (shortness of breath)   Spontaneous pneumothorax   Dementia (HCC)   Protein-calorie malnutrition, severe   Emphysema/COPD (HCC)   Hypokalemia  Recurrent spontaneous pneumothorax Status post chest tube waterseal with persistent air leakage.  Patient is a status post 3 right upper lobe segmental bronchi and one right middle lobe bronchus valve placement today. CT surgery on board.  Follow CT surgery recommendations.  Mild dementia with sundowning in setting of suspected polypharmacy Urinalysis was negative.  TSH, ammonia and B12 within normal limits.  History of chronic benzos and scheduled oxycodone.  Currently on tramadol.  Appears stable at this time.  Severe emphysema/COPD/chronic hypoxic respiratory failure on 3 L nasal cannula at home. Continue Symbicort and albuterol.  Continue supplemental oxygen, currently on 5 L of oxygen.  Hyperkalemia resolved.  Monitor BMP closely.  Potassium of 4.0 today.  Chronic normocytic anemia stable, monitor CBC closely.  History of COVID-19 PCR and antigen  negative on admission   VTE Prophylaxis: Lovenox  Code Status: Partial code  Family Communication: None today.  Spoke with the patient in detail  Disposition Plan: Home with home health with wheel chair on discharge.  Follow CT surgery recommendations on discharge.   Consultants: Cardiothoracic surgery Interventional radiology  Procedures: 1/6-chest tube to waterseal-repeat chest x-ray showing worsening pneumothorax chest tube placed back to suction 1/9-chest tube was dislodged but patient not in distress toTCTS calledIR to place PIG tailcatheterwhich was done 1/9 PM 1/13-status post video bronchoscopy with placement of IBV  Antibiotics:  Anti-infectives (From admission, onward)   None      Subjective: Today, patient was seen after IBV.  Denies any shortness of breath, chest pain, palpitation, cough or fever.  Objective: Vitals:   10/12/19 0444 10/12/19 0715  BP: (!) 102/50   Pulse:    Resp: (!) 25   Temp: 97.6 F (36.4 C)   SpO2: 96% 96%    Intake/Output Summary (Last 24 hours) at 10/12/2019 0923 Last data filed at 10/12/2019 0400 Gross per 24 hour  Intake 237 ml  Output 525 ml  Net -288 ml   Filed Weights   09/29/19 1401 09/30/19 0005 10/04/19 0303  Weight: 47.6 kg 47 kg 44.4 kg   Body mass index is 16.29 kg/m.   Physical Exam: GENERAL: Alert awake oriented.  Not in obvious distress. On  Nasal cannula oxygen. HENT: No scleral pallor or icterus. Pupils equally reactive to light. Oral mucosa is moist NECK: is supple, no palpable thyroid enlargement. CHEST: Respirations bilaterally diminished. Right chest wall chest tube in place. CVS: S1 and S2 heard, no murmur. Regular rate and rhythm. No pericardial rub. ABDOMEN: Soft,  non-tender, bowel sounds are present.  Urostomy bag in place. EXTREMITIES: No edema. CNS: Cranial nerves are intact. No focal motor or sensory deficits. SKIN: warm and dry without rashes.  Data Review: I have personally reviewed  the following laboratory data and studies,  CBC: Recent Labs  Lab 10/07/19 0253 10/09/19 0305 10/09/19 1410 10/11/19 0216 10/12/19 0354  WBC 7.4 8.2 6.7 7.0 8.3  HGB 9.6* 9.4* 9.4* 9.9* 10.1*  HCT 30.6* 29.7* 29.7* 31.9* 31.2*  MCV 95.0 94.9 94.0 96.4 93.4  PLT 481* 487* 478* 503* [id number]*   Basic Metabolic Panel: Recent Labs  Lab 10/07/19 0253 10/09/19 0305 10/09/19 1410 10/11/19 0216 10/12/19 0354  NA 140 140 138 141 140  K 4.3 4.0 4.6 4.6 4.0  CL 102 103 104 102 103  CO2 28 27 28 28 26   GLUCOSE 118* 106* 105* 97 113*  BUN 12 15 13 13 13   CREATININE 0.74 0.75 0.66 0.84 0.73  CALCIUM 9.1 9.1 9.3 9.6 9.1  MG  --   --   --  2.1 2.0  PHOS  --   --   --  4.4  --    Liver Function Tests: Recent Labs  Lab 10/07/19 0253 10/09/19 1410  AST 23 21  ALT 21 21  ALKPHOS 60 61  BILITOT 0.3 0.5  PROT 6.1* 6.2*  ALBUMIN 2.2* 2.1*   No results for input(s): LIPASE, AMYLASE in the last 168 hours. Recent Labs  Lab 10/06/19 1701  AMMONIA 16   Cardiac Enzymes: No results for input(s): CKTOTAL, CKMB, CKMBINDEX, TROPONINI in the last 168 hours. BNP (last 3 results) No results for input(s): BNP in the last 8760 hours.  ProBNP (last 3 results) No results for input(s): PROBNP in the last 8760 hours.  CBG: No results for input(s): GLUCAP in the last 168 hours. Recent Results (from the past 240 hour(s))  Surgical pcr screen     Status: None   Collection Time: 10/10/19 12:00 AM   Specimen: Nasal Mucosa; Nasal Swab  Result Value Ref Range Status   MRSA, PCR NEGATIVE NEGATIVE Final   Staphylococcus aureus NEGATIVE NEGATIVE Final    Comment: (NOTE) The Xpert SA Assay (FDA approved for NASAL specimens in patients 99 years of age and older), is one component of a comprehensive surveillance program. It is not intended to diagnose infection nor to guide or monitor treatment. Performed at [facility], [place] [address] [place], [place] [address]      Studies: DG Chest 1V  REPEAT Same Day  Result Date: 10/10/2019 CLINICAL DATA:  Right-sided pneumothorax. EXAM: CHEST - 1 VIEW SAME DAY COMPARISON:  Chest x-ray 10/10/2019 at 6:59 a.m. FINDINGS: Right-sided pneumothorax is not significantly changed. Pigtail catheter chest tube is in place. Small effusions remain, right greater than left. Bibasilar airspace opacities are present, right greater than left. Atherosclerotic calcifications are noted at the aortic arch. Pulmonary fibrosis and changes of COPD are noted. IMPRESSION: 1. Stable right-sided pneumothorax. 2. Stable bibasilar airspace disease, right greater than left. 3. Small bilateral pleural effusions, right greater than left. Electronically Signed   By: San Morelle M.D.   On: 10/10/2019 13:10   DG CHEST PORT 1 VIEW  Result Date: 10/11/2019 CLINICAL DATA:  Pneumothorax, chest tube. EXAM: PORTABLE CHEST 1 VIEW COMPARISON:  Chest radiograph 10/10/2019 FINDINGS: Unchanged position of a right-sided chest tube. A right-sided pneumothorax has not significantly changed. Overlying cardiac monitoring leads. Cardiomediastinal silhouette unchanged. Aortic atherosclerosis. Redemonstrated background pulmonary fibrosis and changes of  COPD. Unchanged small bilateral pleural effusions and bibasilar airspace opacities (greater on the right). IMPRESSION: No significant interval change. Unchanged right-sided pneumothorax. Stable small bilateral pleural effusions with bibasilar airspace disease, greater on the right. Electronically Signed   By: Kellie Simmering DO   On: 10/11/2019 07:51    Scheduled Meds: . [MAR Hold] ALPRAZolam  0.5 mg Oral Daily  . [MAR Hold] docusate sodium  100 mg Oral BID  . [MAR Hold] enoxaparin (LOVENOX) injection  40 mg Subcutaneous Q24H  . [MAR Hold] feeding supplement  1 Container Oral TID BM  . [MAR Hold] lidocaine-EPINEPHrine  20 mL Intradermal Once  . [MAR Hold] mouth rinse  15 mL Mouth Rinse BID  . [MAR Hold] mometasone-formoterol  2 puff Inhalation  BID  . [MAR Hold] multivitamin with minerals  1 tablet Oral Daily  . [MAR Hold] senna  1 tablet Oral Daily  . [MAR Hold] sodium chloride flush  3 mL Intravenous Q12H    Continuous Infusions: . [MAR Hold] sodium chloride       Flora Lipps, MD  [facility] 10/12/2019

## 2019-10-12 NOTE — Progress Notes (Signed)
PT Cancellation Note  Patient Details Name: MARIALYCE DELHOMME MRN: [id number] DOB: Dec 31, 1940   Cancelled Treatment:    Reason Eval/Treat Not Completed: Patient at procedure or test/unavailable patient at procedure this morning- will attempt to return if time/schedule allow and if patient is medically ready.    Windell Norfolk, DPT, PN1   Supplemental Physical Therapist [facility]    Pager [phone] Acute Rehab Office [phone]

## 2019-10-12 NOTE — Interval H&P Note (Signed)
History and Physical Interval Note:  10/12/2019 8:17 AM  Amanda [name]  has presented today for surgery, with the diagnosis of AIRLEAK.  The various methods of treatment have been discussed with the patient and family. After consideration of risks, benefits and other options for treatment, the patient has consented to  Procedure(s): VIDEO BRONCHOSCOPY WITH INSERTION OF INTERBRONCHIAL VALVE (IBV) (N/A) as a surgical intervention.  The patient's history has been reviewed, patient examined, no change in status, stable for surgery.  I have reviewed the patient's chart and labs.  Questions were answered to the patient's satisfaction.     [name]

## 2019-10-12 NOTE — Anesthesia Preprocedure Evaluation (Signed)
Anesthesia Evaluation  Patient identified by MRN, date of birth, ID band Patient awake    Reviewed: Allergy & Precautions, H&P , NPO status , Patient's Chart, lab work & pertinent test results  Airway Mallampati: II   Neck ROM: full    Dental   Pulmonary COPD, Current Smoker,  COVID-19 (07/2019)   breath sounds clear to auscultation       Cardiovascular negative cardio ROS   Rhythm:regular Rate:Normal     Neuro/Psych Seizures -,  PSYCHIATRIC DISORDERS Dementia    GI/Hepatic   Endo/Other    Renal/GU stones     Musculoskeletal  (+) Arthritis ,   Abdominal   Peds  Hematology   Anesthesia Other Findings   Reproductive/Obstetrics                             Anesthesia Physical Anesthesia Plan  ASA: III  Anesthesia Plan: General   Post-op Pain Management:    Induction: Intravenous  PONV Risk Score and Plan: 2 and Ondansetron, Dexamethasone and Treatment may vary due to age or medical condition  Airway Management Planned: Oral ETT  Additional Equipment:   Intra-op Plan:   Post-operative Plan: Extubation in OR  Informed Consent: I have reviewed the patients History and Physical, chart, labs and discussed the procedure including the risks, benefits and alternatives for the proposed anesthesia with the patient or authorized representative who has indicated his/her understanding and acceptance.       Plan Discussed with: CRNA, Anesthesiologist and Surgeon  Anesthesia Plan Comments:         Anesthesia Quick Evaluation

## 2019-10-12 NOTE — Transfer of Care (Signed)
Immediate Anesthesia Transfer of Care Note  Patient: Amanda [name]  Procedure(s) Performed: VIDEO BRONCHOSCOPY WITH INSERTION OF INTERBRONCHIAL VALVES (IBV)  TIMES THREE RIGHT UPPER LOBE , ONE MIDDLE LOBE. (N/A Chest)  Patient Location: PACU  Anesthesia Type:General  Level of Consciousness: awake and drowsy  Airway & Oxygen Therapy: Patient Spontanous Breathing and Patient connected to nasal cannula oxygen  Post-op Assessment: Report given to RN, Post -op Vital signs reviewed and stable and Patient moving all extremities X 4  Post vital signs: Reviewed and stable  Last Vitals:  Vitals Value Taken Time  BP 116/56 10/12/19 1000  Temp    Pulse 102 10/12/19 1003  Resp 27 10/12/19 1003  SpO2 93 % 10/12/19 1003  Vitals shown include unvalidated device data.  Last Pain:  Vitals:   10/12/19 0444  TempSrc: Oral  PainSc:       Patients Stated Pain Goal: 0 ([id number])  Complications: No apparent anesthesia complications

## 2019-10-12 NOTE — Op Note (Signed)
NAME: [name], Amanda [id number] ACCOUNT [id number] DATE OF BIRTH:1941/05/29 FACILITY: [facility] LOCATION: [facility] PHYSICIAN:Sparkle Aube C. Akayla Brass, MD  OPERATIVE REPORT  DATE OF PROCEDURE:  10/12/2019  PREOPERATIVE DIAGNOSIS:  Right spontaneous pneumothorax with prolonged air leak.  POSTOPERATIVE DIAGNOSIS:  Right spontaneous pneumothorax with prolonged air leak.  PROCEDURE:  Video bronchoscopy with placement of intrabronchial valves (3 valves in right upper lobe, 1 valve in right middle lobe).  SURGEON:  Modesto Charon, MD  ASSISTANT:  None.  ANESTHESIA:  General.  FINDINGS:  Large air leak with nearly all of tidal volume escaping chest with tube on suction.  Diminished air leak with occlusion of right upper lobe and right middle lobe.  No impact on air leak with balloon occlusion of lower lobe bronchi.  CLINICAL NOTE:  The patient is a 79 year old woman with severe emphysema and multiple right pneumothoraces.  She had been diagnosed with coronavirus about a month ago.  At that time, she had a spontaneous pneumothorax, which was treated with a chest  tube.  Air leak resolved and she was discharged home.  She presented with a recurrent pneumothorax.  A chest tube was placed, but she failed placement to waterseal and has an ongoing air leak.  She was offered the option of bronchoscopy for  intrabronchial valve placement to hopefully help the air leak resolve sooner.  The indications, risks, benefits, and alternatives were discussed in detail with the patient.  I also discussed the procedure with her daughter separately.  They did  understand there was no guarantee of success.  They also understood there is a possibility of some improvement of her baseline COPD.  She accepted the risks and wished to proceed.  OPERATIVE NOTE:  The patient was brought to the operating room on 10/12/2019.  There was a chest tube in place.  She was anesthetized and intubated.  Initially with  the chest tube placed to suction, there was a massive air leak and approximately 80% of  the tidal volume was being evacuated through the chest tube.  The tube was placed to waterseal and only about half of the volume was coming out through the tube.  The tube was left on waterseal through the remainder of the procedure.  A timeout was  performed.  Flexible fiberoptic bronchoscopy was performed via the endotracheal tube.  It revealed normal endobronchial anatomy and no endobronchial lesions to the level of the subsegmental bronchi.  A #5 Fogarty catheter was advanced through the tube  and inflated in the right mainstem bronchus and there was resolution of the air leak.  Blocking the right upper lobe bronchus decreased the air leak, but it did not completely resolve.  Inflation in the bronchus intermedius had minimal effect on the air leak.   Inflation of the middle lobe bronchus had minimal effect and there was no effect from inflating in the lower lobe bronchus.  The sizing balloon for the valves was advanced through the bronchoscope and the right upper lobe segmental bronchi were sized.  The anterior segmental bronchus was on the borderline between 6 mm and 7 mm valves, the posterior sized for a 9 mm valve and the apical  sized for a 7 mm valve.  A 7 mm valve was deployed in the anterior bronchus, but did not seat well.  A 9 mm valve was deployed in the posterior segmental bronchus and a 7 mm valve was deployed in the apical segmental bronchus.  After deployment of these  valves, the returned tidal  volumes improved and the air leak improved, but was still significant.  At this point now with the upper lobe occluded, inflation of the balloon in the bronchus intermedius totally resolved the air leak.  Inflation in the  superior segmental and basilar segmental airways to the lower lobe did not appear to have any significant effect on the air leak.  Inflation in the middle lobe bronchus improved the air leak,  but not completely resolve it.  The decision was made to go  ahead and place a valve in the middle lobe bronchus.  That sized for a 9 mm valve and that the valve was deployed without difficulty.  Reinspection of the upper lobe bronchus revealed that the anterior segmental valve was not well seated.  It was  removed.  A 6 mm valve then was placed in that bronchus and seated nicely.  The air leak was smaller than it had been on arrival to the OR, but was not completely resolved.  The patient was extubated in the operating room and taken to the postanesthetic  care unit in good condition.  VN/NUANCE  D:10/12/2019 T:10/12/2019 JOB:[id number]

## 2019-10-12 NOTE — Anesthesia Procedure Notes (Signed)
Procedure Name: Intubation Date/Time: 10/12/2019 8:45 AM Performed by: Leonor Liv, CRNA Pre-anesthesia Checklist: Patient identified, Emergency Drugs available, Suction available and Patient being monitored Patient Re-evaluated:Patient Re-evaluated prior to induction Oxygen Delivery Method: Circle System Utilized Preoxygenation: Pre-oxygenation with 100% oxygen Induction Type: IV induction Ventilation: Mask ventilation without difficulty Laryngoscope Size: Mac and 3 Grade View: Grade I Tube type: Oral Tube size: 8.5 mm Number of attempts: 1 Airway Equipment and Method: Stylet and Oral airway Placement Confirmation: ETT inserted through vocal cords under direct vision,  positive ETCO2 and breath sounds checked- equal and bilateral Secured at: 20 cm Tube secured with: Tape Dental Injury: Teeth and Oropharynx as per pre-operative assessment

## 2019-10-12 NOTE — Progress Notes (Signed)
Nutrition Follow-up  DOCUMENTATION CODES:   Underweight  INTERVENTION:   -Boost Breeze po TID, each supplement provides 250 kcal and 9 grams of protein  -Multivitamin with minerals daily  NUTRITION DIAGNOSIS:   Increased nutrient needs related to chronic illness(COPD, underweight) as evidenced by estimated needs.  Ongoing.  GOAL:   Patient will meet greater than or equal to 90% of their needs  Progressing.  MONITOR:   PO intake, Supplement acceptance, Labs, Skin  ASSESSMENT:   79 yo female admitted with recurrent spontaneous pneumothorax. Chest tube placed 12/31. PMH includes dementia, COPD, COVID-18 Aug 2019, bladder cancer, seizures.  **RD working remotely**  Pt has been NPO today for procedure (video bronchoscopy w/ insertion of interbronchial valve).  Pt has been on a heart healthy diet. No PO has been documented since 1/1.  Pt has been drinking Boost Breeze supplements, will continue.  Admission weight: 105 lbs. No recent weight measured for this admission. Last recorded 1/5.   I/Os: -4.9L since admit UOP: 725 ml x 24 hrs  Labs reviewed. Medications: Multivitamin with minerals daily  Diet Order:   Diet Order            Diet NPO time specified Except for: Sips with Meds  Diet effective midnight              EDUCATION NEEDS:   Not appropriate for education at this time  Skin:  Skin Assessment: Reviewed RN Assessment(R chest tube)  Last BM:  1/3  Height:   Ht Readings from Last 1 Encounters:  09/30/19 5\' 5"  (1.651 m)    Weight:   Wt Readings from Last 1 Encounters:  10/04/19 44.4 kg    Ideal Body Weight:  56.8 kg  BMI:  Body mass index is 16.29 kg/m.  Estimated Nutritional Needs:   Kcal:  1400-1600  Protein:  70-80 gm  Fluid:  >/= 1.4 L   Clayton Bibles, MS, RD, LDN Inpatient Clinical Dietitian Pager: [phone] After Hours Pager: [phone]

## 2019-10-12 NOTE — Brief Op Note (Addendum)
09/29/2019 - 10/12/2019  9:51 AM  PATIENT:  Amanda [name]  79 y.o. female  PRE-OPERATIVE DIAGNOSIS:  SPONTANEOUS PNEUMOTHORAX with PERSISTENT AIR LEAK  POST-OPERATIVE DIAGNOSIS:  SPONTANEOUS PNEUMOTHORAX with PERSISTENT AIR LEAK  PROCEDURE:   VIDEO BRONCHOSCOPY PLACEMENT OF INTRABRONCHIAL VALVES- 3 in RIGHT UPPER LOBE, 1 RIGHT MIDDLE LOBE  SURGEON:  Surgeon(s) and Role:    * [name], MD - Primary  PHYSICIAN ASSISTANT:   ASSISTANTS: none   ANESTHESIA:   general  EBL: minimal  BLOOD ADMINISTERED:none  DRAINS: 4 IBV    LOCAL MEDICATIONS USED:  NONE  SPECIMEN:  No Specimen  DISPOSITION OF SPECIMEN:  N/A  COUNTS:  NO endoscopic  TOURNIQUET:  * No tourniquets in log *  DICTATION: .Other Dictation: Dictation Number -  PLAN OF CARE: Admit to inpatient   PATIENT DISPOSITION:  PACU - hemodynamically stable.   Delay start of Pharmacological VTE agent (>24hrs) due to surgical blood loss or risk of bleeding: no  Valves placed in all 3 RUL segmental bronchi and right middle lobe bronchus. Significant decrease but not complete resolution of air leak

## 2019-10-13 ENCOUNTER — Encounter: Payer: Self-pay | Admitting: *Deleted

## 2019-10-13 ENCOUNTER — Inpatient Hospital Stay (HOSPITAL_COMMUNITY): Payer: Medicare Other

## 2019-10-13 LAB — BASIC METABOLIC PANEL
Anion gap: 9 (ref 5–15)
BUN: 10 mg/dL (ref 8–23)
CO2: 28 mmol/L (ref 22–32)
Calcium: 9 mg/dL (ref 8.9–10.3)
Chloride: 106 mmol/L (ref 98–111)
Creatinine, Ser: 0.92 mg/dL (ref 0.44–1.00)
GFR calc Af Amer: >60 mL/min (ref >60)
GFR calc non Af Amer: 60 mL/min — ABNORMAL LOW (ref >60)
Glucose, Bld: 107 mg/dL — ABNORMAL HIGH (ref 70–99)
Potassium: 4.3 mmol/L (ref 3.5–5.1)
Sodium: 143 mmol/L (ref 135–145)

## 2019-10-13 LAB — CBC
HCT: 30.5 % — ABNORMAL LOW (ref 36.0–46.0)
Hemoglobin: 9.5 g/dL — ABNORMAL LOW (ref 12.0–15.0)
MCH: 29.9 pg (ref 26.0–34.0)
MCHC: 31.1 g/dL (ref 30.0–36.0)
MCV: 95.9 fL (ref 80.0–100.0)
Platelets: 483 10*3/uL — ABNORMAL HIGH (ref 150–400)
RBC: 3.18 MIL/uL — ABNORMAL LOW (ref 3.87–5.11)
RDW: 14.7 % (ref 11.5–15.5)
WBC: 8.1 10*3/uL (ref 4.0–10.5)
nRBC: 0 % (ref 0.0–0.2)

## 2019-10-13 LAB — MAGNESIUM: Magnesium: 1.8 mg/dL (ref 1.7–2.4)

## 2019-10-13 MED ORDER — QUETIAPINE FUMARATE 25 MG PO TABS
25.0000 mg | ORAL_TABLET | Freq: Every evening | ORAL | Status: DC
  Administered 2019-10-13 – 2019-10-18 (×6): 25 mg via ORAL
  Filled 2019-10-13 (×8): qty 1

## 2019-10-13 MED ORDER — GUAIFENESIN-DM 100-10 MG/5ML PO SYRP
5.0000 mL | ORAL_SOLUTION | ORAL | Status: DC | PRN
  Administered 2019-10-13 – 2019-10-16 (×6): 5 mL via ORAL
  Filled 2019-10-13 (×6): qty 5

## 2019-10-13 NOTE — Progress Notes (Signed)
MD paged about pt's air leak.  Went from 2 to a 4. Night shift nurse also notified. Pt's VSS and in no respiratory distress.

## 2019-10-13 NOTE — Progress Notes (Signed)
Physical Therapy Treatment Patient Details Name: Amanda [name] MRN: [id number] DOB: 1941/01/04 Today's Date: 10/13/2019    History of Present Illness Pt is a 79 y.o. female with recent COVID(+) last month, admitted 09/29/19 with worsening SOB. Pt with R-side pneumothorax s/p chest tube placement. Pt is now s/p placement of intrabrachial valves x4 1/13.  Also with AMS; mild dementia history per primary. PMH includes multiple spontaneous pneumothorax, COPD, bladder CA. Of note, son recently passed 09/24/19 from [place].    PT Comments    Pt in bed upon arrival of PT, agreeable to PT session with focus on progressing mobility and endurance. The pt continues to present with sig limitations in functional mobility, endurance, and independence due to above dx, and will continue to benefit from skilled PT to progress these deficits. The pt is currently limited most by rapid onset of fatigue (~5 ft of ambulation or one stand-pivot transfer) as well as desat of SpO2 with exertion. The pt was unable to progress with therapy from last session today due to reports of fatigue, and will need to be able to ambulate longer distances with better O2 saturation prior to anticipated d/c home. PT will continue to follow acutely.    Follow Up Recommendations  Home health PT;Supervision/Assistance - 24 hour     Equipment Recommendations  Wheelchair (measurements PT);Wheelchair cushion (measurements PT)    Recommendations for Other Services       Precautions / Restrictions Precautions Precautions: Fall;Other (comment) Precaution Comments: watch SpO2 with mobility, R chest tube Restrictions Weight Bearing Restrictions: No    Mobility  Bed Mobility Overal bed mobility: Needs Assistance Bed Mobility: Supine to Sit     Supine to sit: Modified independent (Device/Increase time)     General bed mobility comments: no assist needed, pt manages lines  Transfers Overall transfer level: Needs  assistance Equipment used: Rolling walker (2 wheeled);None Transfers: Sit to/from Stand Sit to Stand: Min guard Stand pivot transfers: Min guard       General transfer comment: pt completed X6, able to maneuver without assist depsite initially claiming she needed assist to stand. min guard for line management  Ambulation/Gait Ambulation/Gait assistance: Min guard Gait Distance (Feet): 5 Feet Assistive device: Rolling walker (2 wheeled) Gait Pattern/deviations: Step-to pattern;Trunk flexed;Narrow base of support Gait velocity: decreased Gait velocity interpretation: <1.31 ft/sec, indicative of household ambulator General Gait Details: pt not wanting to walk today, but was able to ambulate ~5' to recliner to rest. sig distraction by lines and chest tube.   Stairs             Wheelchair Mobility    Modified Rankin (Stroke Patients Only)       Balance Overall balance assessment: Needs assistance Sitting-balance support: Feet supported;No upper extremity supported Sitting balance-Leahy Scale: Fair     Standing balance support: Bilateral upper extremity supported Standing balance-Leahy Scale: Fair Standing balance comment: pt able to stand and transfers without RW, benefits from at least one UE support                            Cognition Arousal/Alertness: Awake/alert Behavior During Therapy: [facility] for tasks assessed/performed;Anxious Overall Cognitive Status: History of cognitive impairments - at baseline Area of Impairment: Orientation;Attention;Memory;Following commands;Awareness;Problem solving                 Orientation Level: Disoriented to;Time;Situation Current Attention Level: Sustained;Selective Memory: Decreased short-term memory Following Commands: Follows one step commands consistently;Follows one  step commands with increased time Safety/Judgement: Decreased awareness of safety Awareness: Intellectual Problem Solving: Slow  processing;Requires verbal cues General Comments: Very anxious about chest tube coming out, distracting for her. Pt knows that it is 2021 but not the date,. Pt demos good management of lines, but is limited by fatigue and unwilling to attempt progression of mobility in presence of fatigue even following thorough education.      Exercises      General Comments General comments (skin integrity, edema, etc.): SpO2 in 90s at rest, decreased as low as 75% with trasnfers to [facility] and recliner, pt able to recover quickly to the 90s, but reports ongoing fatigue for ~1 min after each transfer      Pertinent Vitals/Pain Pain Assessment: Faces Faces Pain Scale: Hurts a little bit Pain Location: R-side abdomen Pain Descriptors / Indicators: Sore Pain Intervention(s): Limited activity within patient's tolerance;Monitored during session    Home Living                      Prior Function            PT Goals (current goals can now be found in the care plan section) Acute Rehab PT Goals Patient Stated Goal: get stronger PT Goal Formulation: With patient Time For Goal Achievement: 10/18/19 Potential to Achieve Goals: Fair Progress towards PT goals: Not progressing toward goals - comment(pt reports sig fatigue today, declines attempts to progress mobility and endurance despite education and reminder of her goal to go home.)    Frequency    Min 3X/week      PT Plan Current plan remains appropriate    Co-evaluation              AM-PAC PT "6 Clicks" Mobility   Outcome Measure  Help needed turning from your back to your side while in a flat bed without using bedrails?: None Help needed moving from lying on your back to sitting on the side of a flat bed without using bedrails?: A Little Help needed moving to and from a bed to a chair (including a wheelchair)?: A Little Help needed standing up from a chair using your arms (e.g., wheelchair or bedside chair)?: A Little Help  needed to walk in hospital room?: A Little Help needed climbing 3-5 steps with a railing? : A Lot 6 Click Score: 18    End of Session Equipment Utilized During Treatment: Oxygen Activity Tolerance: Patient limited by fatigue Patient left: in chair;with call bell/phone within reach;with chair alarm set Nurse Communication: Mobility status PT Visit Diagnosis: Muscle weakness (generalized) (M62.81);Difficulty in walking, not elsewhere classified (R26.2)     Time: [id number] PT Time Calculation (min) (ACUTE ONLY): 32 min  Charges:  $Gait Training: 8-22 mins $Therapeutic Activity: 8-22 mins                     Karma Ganja, PT, DPT   Acute Rehabilitation Department Pager #: [phone]   Otho Bellows 10/13/2019, 1:04 PM

## 2019-10-13 NOTE — Progress Notes (Addendum)
      [address]       [place] [address]             [phone]       1 Day Post-Op Procedure(s) (LRB): VIDEO BRONCHOSCOPY WITH INSERTION OF INTERBRONCHIAL VALVES (IBV)  TIMES THREE RIGHT UPPER LOBE , ONE MIDDLE LOBE. (N/A)  Subjective: Patient without complaints this am and states her breathing is "good".  Objective: Vital signs in last 24 hours: Temp:  [97.9 F (36.6 C)-98.7 F (37.1 C)] 98.7 F (37.1 C) (01/14 0316) Pulse Rate:  [98-118] 98 (01/13 1932) Cardiac Rhythm: Normal sinus rhythm (01/14 0702) Resp:  [17-29] 22 (01/14 0316) BP: (93-116)/(52-60) 109/60 (01/14 0316) SpO2:  [89 %-100 %] 95 % (01/14 0316)     Intake/Output from previous day: 01/13 0701 - 01/14 0700 In: 75 [P.O.:357] Out: 625 [Urine:625]   Physical Exam:  Cardiovascular: RRR Pulmonary: Clear to auscultation bilaterally Wounds: Dressing is clean and dry.   Chest Tube: to water seal, small air leak  Lab Results: CBC: Recent Labs    10/12/19 0354 10/13/19 0641  WBC 8.3 8.1  HGB 10.1* 9.5*  HCT 31.2* 30.5*  PLT 499* 483*   BMET:  Recent Labs    10/12/19 0354 10/13/19 0641  NA 140 143  K 4.0 4.3  CL 103 106  CO2 26 28  GLUCOSE 113* 107*  BUN 13 10  CREATININE 0.73 0.92  CALCIUM 9.1 9.0    PT/INR: No results for input(s): LABPROT, INR in the last 72 hours. ABG:  INR: Will add last result for INR, ABG once components are confirmed Will add last 4 CBG results once components are confirmed  Assessment/Plan:  1. CV - SR 2.  Pulmonary - s/p IBV yesterday (3 in RUL, 1 in RML). On 2 liters of oxygen via [place]. Pigtail chest tube is to water seal and there is a small air leak. CXR this am appears stable. Check CXR in am. 3. Mental status-mild dementia history, per primary  Donielle M ZimmermanPA-C 10/13/2019,8:16 AM [phone]  Air leak looks a little better on water seal but still has a space. Will try placing to suction overnight and see if we can get  the lung to completely reexpand  Remo Lipps C. Roxan Hockey, MD [facility] [phone]

## 2019-10-13 NOTE — Anesthesia Postprocedure Evaluation (Signed)
Anesthesia Post Note  Patient: Amanda [name]  Procedure(s) Performed: VIDEO BRONCHOSCOPY WITH INSERTION OF INTERBRONCHIAL VALVES (IBV)  TIMES THREE RIGHT UPPER LOBE , ONE MIDDLE LOBE. (N/A Chest)     Patient location during evaluation: PACU Anesthesia Type: General Level of consciousness: awake and alert Pain management: pain level controlled Vital Signs Assessment: post-procedure vital signs reviewed and stable Respiratory status: spontaneous breathing, nonlabored ventilation, respiratory function stable and patient connected to nasal cannula oxygen Cardiovascular status: blood pressure returned to baseline and stable Postop Assessment: no apparent nausea or vomiting Anesthetic complications: no    Last Vitals:  Vitals:   10/13/19 0856 10/13/19 0915  BP:  (!) 112/46  Pulse: 100 99  Resp: 17 (!) 24  Temp:  36.8 C  SpO2: 95% 94%    Last Pain:  Vitals:   10/13/19 0915  TempSrc: Oral  PainSc: [place]

## 2019-10-13 NOTE — Progress Notes (Signed)
PROGRESS NOTE  Amanda [name] [id number] DOB: 21-Jun-1941 DOA: 09/29/2019 PCP: Lemmie Evens, MD   LOS: 14 days   Brief narrative: As per HPI,  This is a 79 year old female with severe COPD and mild dementia admitted to the hospital this time. Patient did have recurrent 3rd or 4th spontaneous PTX in past year. Recently had Covid-19 09/19/19) and son just died from it. Had chest tube placed in  ED 12/31 with improvement in dyspnea and PTX. Patient was seen by cardiothoracic surgery.  1/6-chest tube to waterseal-repeat chest x-ray showing worsening pneumothorax chest tube placed back to suction 1/9-chest tube was dislodged but patient not in distress toTCTS calledIR to place PIG tailcatheterwhich was done 1/9. 1/10: Suction increased. 1/12:  Status post IBV valve  Assessment/Plan:  Principal Problem:   Recurrent spontaneous pneumothorax Active Problems:   SOB (shortness of breath)   Spontaneous pneumothorax   Dementia (HCC)   Protein-calorie malnutrition, severe   Emphysema/COPD (HCC)   Hypokalemia  Recurrent spontaneous pneumothorax Patient had chest tube waterseal but with persistent air leakage.  So patient underwent  3 right upper lobe segmental bronchi and one right middle lobe bronchus valve placement 10/12/19. CT surgery on board.  Follow CT surgery recommendations. Check xray of chest in am.   Agitation, confusion yesterday.  Likely hospital induced delirium on the background of dementia .  Spoke with the patient's daughter.  Patient does not take Xanax regularly at home.  She is concerned that xanax might cause it.  We will add low-dose Seroquel in the nighttime.  Mild dementia with sundowning in setting of suspected polypharmacy Urinalysis was negative.  TSH, ammonia and B12 within normal limits.  History of chronic benzos and scheduled oxycodone.  Currently on tramadol.  PRN ativan for extreme agitation.  Severe emphysema/COPD/chronic hypoxic respiratory failure  on 3 L oxygen by nasal cannula at home. Continue Symbicort and albuterol.  Continue supplemental oxygen, currently on 2 L of oxygen.  Hyperkalemia resolved.    Chronic normocytic anemia stable, monitor CBC closely.  History of COVID-19 PCR and antigen negative on admission   VTE Prophylaxis: Lovenox  Code Status: Partial code  Family Communication: spoke with the patient' daughter Lynelle Smoke on the phone and updated her about the clinical condition of the patient.   Disposition Plan: Home with home health , will need wheel chair on discharge.  Follow CT surgery recommendations on discharge.  Consultants: Cardiothoracic surgery Interventional radiology  Procedures: 1/6-chest tube to waterseal-repeat chest x-ray showing worsening pneumothorax chest tube placed back to suction 1/9-chest tube was dislodged but patient not in distress toTCTS calledIR to place PIG tailcatheterwhich was done 1/9 PM 1/13-status post video bronchoscopy with placement of IBV  Antibiotics:  Anti-infectives (From admission, onward)   None     Subjective: Today, feels ok, denies dyspnea, chest pain, shortness of breath, fever or chills. Was confused yesterday and the patient's daughter was concerned about it.   Objective: Vitals:   10/13/19 0128 10/13/19 0316  BP: (!) 110/56 109/60  Pulse:    Resp: (!) 25 (!) 22  Temp: 98.6 F (37 C) 98.7 F (37.1 C)  SpO2: 92% 95%    Intake/Output Summary (Last 24 hours) at 10/13/2019 0706 Last data filed at 10/12/2019 2035 Gross per 24 hour  Intake 357 ml  Output 625 ml  Net -268 ml   Filed Weights   09/29/19 1401 09/30/19 0005 10/04/19 0303  Weight: 47.6 kg 47 kg 44.4 kg   Body mass index is  16.29 kg/m.   Physical Exam:  GENERAL: Alert awake and communicative.  Not in obvious distress. On  Nasal cannula oxygen at 2 L/min. HENT: No scleral pallor or icterus. Pupils equally reactive to light. Oral mucosa is moist NECK: is supple, no palpable  thyroid enlargement. CHEST: Respirations bilaterally diminished in intensity. Right chest wall chest tube in place. CVS: S1 and S2 heard, no murmur. Regular rate and rhythm. No pericardial rub. ABDOMEN: Soft, non-tender, bowel sounds are present.  Urostomy bag in place. EXTREMITIES: No edema. CNS: Cranial nerves are intact. No focal motor or sensory deficits. SKIN: warm and dry without rashes.  Data Review: I have personally reviewed the following laboratory data and studies,  CBC: Recent Labs  Lab 10/09/19 0305 10/09/19 1410 10/11/19 0216 10/12/19 0354 10/13/19 0641  WBC 8.2 6.7 7.0 8.3 8.1  HGB 9.4* 9.4* 9.9* 10.1* 9.5*  HCT 29.7* 29.7* 31.9* 31.2* 30.5*  MCV 94.9 94.0 96.4 93.4 95.9  PLT 487* 478* 503* 499* [id number]*   Basic Metabolic Panel: Recent Labs  Lab 10/07/19 0253 10/09/19 0305 10/09/19 1410 10/11/19 0216 10/12/19 0354  NA 140 140 138 141 140  K 4.3 4.0 4.6 4.6 4.0  CL 102 103 104 102 103  CO2 28 27 28 28 26   GLUCOSE 118* 106* 105* 97 113*  BUN 12 15 13 13 13   CREATININE 0.74 0.75 0.66 0.84 0.73  CALCIUM 9.1 9.1 9.3 9.6 9.1  MG  --   --   --  2.1 2.0  PHOS  --   --   --  4.4  --    Liver Function Tests: Recent Labs  Lab 10/07/19 0253 10/09/19 1410  AST 23 21  ALT 21 21  ALKPHOS 60 61  BILITOT 0.3 0.5  PROT 6.1* 6.2*  ALBUMIN 2.2* 2.1*   No results for input(s): LIPASE, AMYLASE in the last 168 hours. Recent Labs  Lab 10/06/19 1701  AMMONIA 16   Cardiac Enzymes: No results for input(s): CKTOTAL, CKMB, CKMBINDEX, TROPONINI in the last 168 hours. BNP (last 3 results) No results for input(s): BNP in the last 8760 hours.  ProBNP (last 3 results) No results for input(s): PROBNP in the last 8760 hours.  CBG: No results for input(s): GLUCAP in the last 168 hours. Recent Results (from the past 240 hour(s))  Surgical pcr screen     Status: None   Collection Time: 10/10/19 12:00 AM   Specimen: Nasal Mucosa; Nasal Swab  Result Value Ref Range Status    MRSA, PCR NEGATIVE NEGATIVE Final   Staphylococcus aureus NEGATIVE NEGATIVE Final    Comment: (NOTE) The Xpert SA Assay (FDA approved for NASAL specimens in patients 44 years of age and older), is one component of a comprehensive surveillance program. It is not intended to diagnose infection nor to guide or monitor treatment. Performed at [facility], [place] [address], [place], [place] [address]      Studies: DG Chest Port 1 View  Result Date: 10/12/2019 CLINICAL DATA:  Follow-up pneumothorax, status post bronchoscopy. EXAM: PORTABLE CHEST 1 VIEW COMPARISON:  10/11/2019 FINDINGS: Right chest tube remains in place. Unchanged appearance of right-sided pneumothorax. Normal heart size. The lungs are hyperinflated and there are marked diffuse interstitial changes of pulmonary fibrosis and emphysema. Similar appearance of mildly decreased aeration to the lung bases. a IMPRESSION: No change in appearance of right-sided pneumothorax status post chest tube placement. Normal heart size. Electronically Signed   By: Kerby Moors M.D.   On:  10/12/2019 10:25    Scheduled Meds: . ALPRAZolam  0.5 mg Oral Daily  . docusate sodium  100 mg Oral BID  . enoxaparin (LOVENOX) injection  40 mg Subcutaneous Q24H  . feeding supplement  1 Container Oral TID BM  . mouth rinse  15 mL Mouth Rinse BID  . mometasone-formoterol  2 puff Inhalation BID  . multivitamin with minerals  1 tablet Oral Daily  . senna  1 tablet Oral Daily  . sodium chloride flush  3 mL Intravenous Q12H    Continuous Infusions: . sodium chloride       Flora Lipps, MD  [facility] 10/13/2019

## 2019-10-14 ENCOUNTER — Inpatient Hospital Stay (HOSPITAL_COMMUNITY): Payer: Medicare Other

## 2019-10-14 MED ORDER — ONDANSETRON HCL 4 MG/2ML IJ SOLN
4.0000 mg | Freq: Four times a day (QID) | INTRAMUSCULAR | Status: DC | PRN
  Administered 2019-10-14 – 2019-10-16 (×2): 4 mg via INTRAVENOUS
  Filled 2019-10-14 (×2): qty 2

## 2019-10-14 NOTE — Progress Notes (Addendum)
PROGRESS NOTE  [name] [id number] DOB: 1941/09/25 DOA: 09/29/2019 PCP: Lemmie Evens, MD   LOS: 15 days   Brief narrative: As per HPI,  This is a 79 year old female with severe COPD and mild dementia admitted to the hospital this time. Patient did have recurrent 3rd or 4th spontaneous PTX in past year. Recently had Covid-19 02-Sep-2019) and son just died from it. Had chest tube placed in  ED 12/31 with improvement in dyspnea and PTX. Patient was seen by cardiothoracic surgery.  1/6-chest tube to waterseal-repeat chest x-ray showing worsening pneumothorax chest tube placed back to suction 1/9-chest tube was dislodged but patient not in distress toTCTS calledIR to place PIG tailcatheterwhich was done 1/9. 1/10: Suction increased. 1/12:  Status post IBV valve.  Assessment/Plan:  Principal Problem:   Recurrent spontaneous pneumothorax Active Problems:   SOB (shortness of breath)   Spontaneous pneumothorax   Dementia (HCC)   Protein-calorie malnutrition, severe   Emphysema/COPD (HCC)   Hypokalemia  Recurrent spontaneous pneumothorax Patient had chest tube waterseal but with persistent air leakage.  So patient underwent  3 right upper lobe segmental bronchi and one right middle lobe bronchus valve placement 10/12/19. CT surgery on board but still with leak so suction was added 1/14.    Chest x-ray from today shows unchanged right pneumothorax.Follow CT surgery recommendations.  Agitation, confusion Likely hospital induced delirium on the background of dementia   Patient does not take Xanax regularly at home.  added low-dose Seroquel yesterday discussing with daughter.  Mild dementia with sundowning in setting of suspected polypharmacy Urinalysis was negative.  TSH, ammonia and B12 within normal limits.  History of chronic benzos and scheduled oxycodone.  Currently on tramadol.  Added low-dose Seroquel yesterday and has tolerated well.  Severe emphysema/COPD/chronic hypoxic  respiratory failure on 3 L oxygen by nasal cannula at home. Continue Symbicort and albuterol.  Continue supplemental oxygen, currently on 2 L of oxygen.  Hyperkalemia resolved.  Check BMP in a.m.  Chronic normocytic anemia stable, monitor CBC closely.  Check CBC in a.m.  History of COVID-19 PCR and antigen negative on admission  VTE Prophylaxis: Lovenox  Code Status: Partial code  Family Communication: None today, spoke with the patient's daughter yesterday  Disposition Plan:  Likely Home with home health , uncertain time.  Will need wheel chair on discharge.  Follow CT surgery recommendations on chest tube management.  Consultants: Cardiothoracic surgery Interventional radiology  Procedures: 1/6-chest tube to waterseal-repeat chest x-ray showing worsening pneumothorax chest tube placed back to suction 1/9-chest tube was dislodged but patient not in distress toTCTS calledIR to place PIG tailcatheterwhich was done 1/9 PM 1/13-status post video bronchoscopy with placement of IBV  Antibiotics:  Anti-infectives (From admission, onward)   None     Subjective: Today, patient denies increasing shortness of breath, chest pain.  Has mild cough.  No fever chills or rigor.  No Confusion or agitation reported by the nursing staff   Objective: Vitals:   10/14/19 0754 10/14/19 0756  BP: 99/82   Pulse: 97 96  Resp: (!) 21 16  Temp: 98.2 F (36.8 C)   SpO2: 97% 98%    Intake/Output Summary (Last 24 hours) at 10/14/2019 0845 Last data filed at 10/13/2019 2300 Gross per 24 hour  Intake 450 ml  Output 400 ml  Net 50 ml   Filed Weights   09/29/19 1401 09/30/19 0005 10/04/19 0303  Weight: 47.6 kg 47 kg 44.4 kg   Body mass index is 16.29 kg/m.  Physical Exam:  General: Thinly built, not in obvious distress, nasal cannula oxygen at 2 L/min alert awake communicative HENT: Normocephalic, pupils equally reacting to light and accommodation.  No scleral pallor or icterus  noted. Oral mucosa is moist.  Chest: Diminished breath sounds bilateral.  Right chest wall chest tube in place. CVS: S1 &S2 heard. No murmur.  Regular rate and rhythm. Abdomen: Soft, nontender, nondistended.  Bowel sounds are heard.  Urostomy bag in place.   Extremities: No cyanosis, clubbing or edema.  Peripheral pulses are palpable. Psych: Alert, awake and communicative., normal mood CNS:  No cranial nerve deficits.  Power equal in all extremities.  Skin: Warm and dry.  No rashes noted.   Data Review: I have personally reviewed the following laboratory data and studies,  CBC: Recent Labs  Lab 10/09/19 0305 10/09/19 1410 10/11/19 0216 10/12/19 0354 10/13/19 0641  WBC 8.2 6.7 7.0 8.3 8.1  HGB 9.4* 9.4* 9.9* 10.1* 9.5*  HCT 29.7* 29.7* 31.9* 31.2* 30.5*  MCV 94.9 94.0 96.4 93.4 95.9  PLT 487* 478* 503* 499* [id number]*   Basic Metabolic Panel: Recent Labs  Lab 10/09/19 0305 10/09/19 1410 10/11/19 0216 10/12/19 0354 10/13/19 0641  NA 140 138 141 140 143  K 4.0 4.6 4.6 4.0 4.3  CL 103 104 102 103 106  CO2 27 28 28 26 28   GLUCOSE 106* 105* 97 113* 107*  BUN 15 13 13 13 10   CREATININE 0.75 0.66 0.84 0.73 0.92  CALCIUM 9.1 9.3 9.6 9.1 9.0  MG  --   --  2.1 2.0 1.8  PHOS  --   --  4.4  --   --    Liver Function Tests: Recent Labs  Lab 10/09/19 1410  AST 21  ALT 21  ALKPHOS 61  BILITOT 0.5  PROT 6.2*  ALBUMIN 2.1*   No results for input(s): LIPASE, AMYLASE in the last 168 hours. No results for input(s): AMMONIA in the last 168 hours. Cardiac Enzymes: No results for input(s): CKTOTAL, CKMB, CKMBINDEX, TROPONINI in the last 168 hours. BNP (last 3 results) No results for input(s): BNP in the last 8760 hours.  ProBNP (last 3 results) No results for input(s): PROBNP in the last 8760 hours.  CBG: No results for input(s): GLUCAP in the last 168 hours. Recent Results (from the past 240 hour(s))  Surgical pcr screen     Status: None   Collection Time: 10/10/19 12:00 AM     Specimen: Nasal Mucosa; Nasal Swab  Result Value Ref Range Status   MRSA, PCR NEGATIVE NEGATIVE Final   Staphylococcus aureus NEGATIVE NEGATIVE Final    Comment: (NOTE) The Xpert SA Assay (FDA approved for NASAL specimens in patients 51 years of age and older), is one component of a comprehensive surveillance program. It is not intended to diagnose infection nor to guide or monitor treatment. Performed at [facility], [place] [address] [place], [place] [address]      Studies: DG CHEST PORT 1 VIEW  Result Date: 10/14/2019 CLINICAL DATA:  Pneumothorax.  Chest tube. EXAM: PORTABLE CHEST 1 VIEW COMPARISON:  10/13/2019. FINDINGS: Right chest tube in stable position. Right pneumothorax unchanged. Heart size stable. Endobronchial valves noted stable position. Chronic interstitial changes again noted. Stable bilateral pleural thickening noted. No acute bony abnormality identified. IMPRESSION: 1. Right chest tube in stable position. Right pneumothorax unchanged. 2. Chronic interstitial changes are again noted. No interim change. Electronically Signed   By: Marcello Moores  Register   On: 10/14/2019 07:52  DG Chest Port 1 View  Result Date: 10/13/2019 CLINICAL DATA:  Status post bronchoscopy.  Chest tube. EXAM: PORTABLE CHEST 1 VIEW COMPARISON:  One day prior FINDINGS: Midline trachea. Normal heart size. Atherosclerosis in the transverse aorta. Bilateral costophrenic angle blunting likely relates to pleural fluid. The right-sided chest tube is unchanged in position. Moderate (approximately 15-20%) right-sided pneumothorax is similar, with the visceral pleural maximally 3.0 cm from the chest wall. Diffuse interstitial thickening is not significantly changed. Somewhat more focal confluent bibasilar opacities are similar. IMPRESSION: No change in right-sided chest tube position or size of moderate right pneumothorax. Similar appearance of the lungs, with chronic interstitial thickening and bibasilar  opacities, favoring atelectasis. Aortic Atherosclerosis (ICD10-I70.0). Electronically Signed   By: Abigail Miyamoto M.D.   On: 10/13/2019 08:17   DG Chest Port 1 View  Result Date: 10/12/2019 CLINICAL DATA:  Follow-up pneumothorax, status post bronchoscopy. EXAM: PORTABLE CHEST 1 VIEW COMPARISON:  10/11/2019 FINDINGS: Right chest tube remains in place. Unchanged appearance of right-sided pneumothorax. Normal heart size. The lungs are hyperinflated and there are marked diffuse interstitial changes of pulmonary fibrosis and emphysema. Similar appearance of mildly decreased aeration to the lung bases. a IMPRESSION: No change in appearance of right-sided pneumothorax status post chest tube placement. Normal heart size. Electronically Signed   By: Kerby Moors M.D.   On: 10/12/2019 10:25    Scheduled Meds: . docusate sodium  100 mg Oral BID  . enoxaparin (LOVENOX) injection  40 mg Subcutaneous Q24H  . feeding supplement  1 Container Oral TID BM  . mouth rinse  15 mL Mouth Rinse BID  . mometasone-formoterol  2 puff Inhalation BID  . multivitamin with minerals  1 tablet Oral Daily  . QUEtiapine  25 mg Oral QPM  . senna  1 tablet Oral Daily  . sodium chloride flush  3 mL Intravenous Q12H    Continuous Infusions: . sodium chloride      Flora Lipps, MD  [facility] 10/14/2019

## 2019-10-14 NOTE — Progress Notes (Signed)
Offgoing nurse notified on call of increase air leak in chest tube. No changes noted this shift. Pt remains alert and oriented without complaints

## 2019-10-14 NOTE — Progress Notes (Signed)
Referring Physician(s): [place]  Supervising Physician: Jacqulynn Cadet  Patient Status:  [facility] - In-pt  Chief Complaint: Right pneumothorax  Subjective: Sitting comfortably in chair.  States she feels like she gets short of breath easily.  R anterior chest tube remains in place.  + Air leak.  Allergies: Ciprofloxacin, Lactose, Levofloxacin, Sulfa antibiotics, Sulfamethoxazole, Aspirin, Cephalexin, Clindamycin, Erythromycin base, Hydromorphone, and Penicillins  Medications: Prior to Admission medications   Medication Sig Start Date End Date Taking? Authorizing Provider  acetaminophen (TYLENOL) 500 MG tablet Take 500-1,000 mg by mouth daily as needed for mild pain or moderate pain.    Yes [provider]  albuterol (PROVENTIL HFA;VENTOLIN HFA) 108 (90 Base) MCG/ACT inhaler Inhale 1-2 puffs into the lungs every 6 (six) hours as needed for wheezing or shortness of breath. 09/29/18  Yes Hayden Rasmussen, MD  ALPRAZolam Duanne Moron) 0.5 MG tablet Take 0.5 mg by mouth daily.    Yes [provider]  Cholecalciferol (VITAMIN D-1000 MAX ST) 1000 units tablet Take 1,000 Units by mouth daily.    Yes [provider]  Multiple Vitamin (MULTIVITAMIN) capsule Take 1 capsule by mouth daily.    Yes [provider]  oxyCODONE-acetaminophen (PERCOCET/ROXICET) 5-325 MG tablet Take 1 tablet by mouth 3 (three) times daily. 09/05/19  Yes [provider]  SYMBICORT 80-4.5 MCG/ACT inhaler Inhale 2 puffs into the lungs 2 (two) times daily. 09/05/19  Yes [provider]  vitamin C (ASCORBIC ACID) 250 MG tablet Take 500 mg by mouth daily.    Yes [provider]     Vital Signs: BP 99/82 (BP Location: Right Arm)   Pulse 96   Temp 98.2 F (36.8 C) (Oral)   Resp 16   Ht 5\' 5"  (1.651 m)   Wt 97 lb 14.2 oz (44.4 kg)   SpO2 98%   BMI 16.29 kg/m   Physical Exam  NAD, alert Chest: R chest tube intact.  2+ air leak, to seal.  No output in  [place].  Imaging: DG Chest 1V REPEAT Same Day  Result Date: 10/10/2019 CLINICAL DATA:  Right-sided pneumothorax. EXAM: CHEST - 1 VIEW SAME DAY COMPARISON:  Chest x-ray 10/10/2019 at 6:59 a.m. FINDINGS: Right-sided pneumothorax is not significantly changed. Pigtail catheter chest tube is in place. Small effusions remain, right greater than left. Bibasilar airspace opacities are present, right greater than left. Atherosclerotic calcifications are noted at the aortic arch. Pulmonary fibrosis and changes of COPD are noted. IMPRESSION: 1. Stable right-sided pneumothorax. 2. Stable bibasilar airspace disease, right greater than left. 3. Small bilateral pleural effusions, right greater than left. Electronically Signed   By: San Morelle M.D.   On: 10/10/2019 13:10   DG CHEST PORT 1 VIEW  Result Date: 10/14/2019 CLINICAL DATA:  Pneumothorax.  Chest tube. EXAM: PORTABLE CHEST 1 VIEW COMPARISON:  10/13/2019. FINDINGS: Right chest tube in stable position. Right pneumothorax unchanged. Heart size stable. Endobronchial valves noted stable position. Chronic interstitial changes again noted. Stable bilateral pleural thickening noted. No acute bony abnormality identified. IMPRESSION: 1. Right chest tube in stable position. Right pneumothorax unchanged. 2. Chronic interstitial changes are again noted. No interim change. Electronically Signed   By: Marcello Moores  Register   On: 10/14/2019 07:52   DG Chest Port 1 View  Result Date: 10/13/2019 CLINICAL DATA:  Status post bronchoscopy.  Chest tube. EXAM: PORTABLE CHEST 1 VIEW COMPARISON:  One day prior FINDINGS: Midline trachea. Normal heart size. Atherosclerosis in the transverse aorta. Bilateral costophrenic angle blunting likely relates  to pleural fluid. The right-sided chest tube is unchanged in position. Moderate (approximately 15-20%) right-sided pneumothorax is similar, with the visceral pleural maximally 3.0 cm from the chest wall. Diffuse interstitial thickening  is not significantly changed. Somewhat more focal confluent bibasilar opacities are similar. IMPRESSION: No change in right-sided chest tube position or size of moderate right pneumothorax. Similar appearance of the lungs, with chronic interstitial thickening and bibasilar opacities, favoring atelectasis. Aortic Atherosclerosis (ICD10-I70.0). Electronically Signed   By: Abigail Miyamoto M.D.   On: 10/13/2019 08:17   DG Chest Port 1 View  Result Date: 10/12/2019 CLINICAL DATA:  Follow-up pneumothorax, status post bronchoscopy. EXAM: PORTABLE CHEST 1 VIEW COMPARISON:  10/11/2019 FINDINGS: Right chest tube remains in place. Unchanged appearance of right-sided pneumothorax. Normal heart size. The lungs are hyperinflated and there are marked diffuse interstitial changes of pulmonary fibrosis and emphysema. Similar appearance of mildly decreased aeration to the lung bases. a IMPRESSION: No change in appearance of right-sided pneumothorax status post chest tube placement. Normal heart size. Electronically Signed   By: Kerby Moors M.D.   On: 10/12/2019 10:25   DG CHEST PORT 1 VIEW  Result Date: 10/11/2019 CLINICAL DATA:  Pneumothorax, chest tube. EXAM: PORTABLE CHEST 1 VIEW COMPARISON:  Chest radiograph 10/10/2019 FINDINGS: Unchanged position of a right-sided chest tube. A right-sided pneumothorax has not significantly changed. Overlying cardiac monitoring leads. Cardiomediastinal silhouette unchanged. Aortic atherosclerosis. Redemonstrated background pulmonary fibrosis and changes of COPD. Unchanged small bilateral pleural effusions and bibasilar airspace opacities (greater on the right). IMPRESSION: No significant interval change. Unchanged right-sided pneumothorax. Stable small bilateral pleural effusions with bibasilar airspace disease, greater on the right. Electronically Signed   By: Kellie Simmering DO   On: 10/11/2019 07:51    Labs:  CBC: Recent Labs    10/09/19 1410 10/11/19 0216 10/12/19 0354  10/13/19 0641  WBC 6.7 7.0 8.3 8.1  HGB 9.4* 9.9* 10.1* 9.5*  HCT 29.7* 31.9* 31.2* 30.5*  PLT 478* 503* 499* 483*    COAGS: Recent Labs    10/08/19 1237 10/09/19 1410  INR 1.0 1.1    BMP: Recent Labs    10/09/19 1410 10/11/19 0216 10/12/19 0354 10/13/19 0641  NA 138 141 140 143  K 4.6 4.6 4.0 4.3  CL 104 102 103 106  CO2 28 28 26 28   GLUCOSE 105* 97 113* 107*  BUN 13 13 13 10   CALCIUM 9.3 9.6 9.1 9.0  CREATININE 0.66 0.84 0.73 0.92  GFRNONAA >60 >60 >60 60*  GFRAA >60 >60 >60 >60    LIVER FUNCTION TESTS: Recent Labs    10/01/19 0256 10/07/19 0253 10/09/19 1410  BILITOT 0.6 0.3 0.5  AST 24 23 21   ALT 34 21 21  ALKPHOS 60 60 61  PROT 6.1* 6.1* 6.2*  ALBUMIN 2.1* 2.2* 2.1*    Assessment and Plan: Right pneumothorax s/p chest tube placement 1/9 by Dr. Anselm Pancoast Patient originally had chest tube placed in ED 12/31, dislodged 1/9 and replaced in IR.  Has had persistent air leak since initial placement thus far not resolved after IBV placement 1/13.  Management per TCTS.   IR following.  Electronically Signed: Docia Barrier, PA 10/14/2019, 11:11 AM   I spent a total of 15 minutes at the the patient's bedside AND on the patient's hospital floor or unit, greater than 50% of which was counseling/coordinating care for right chest tube

## 2019-10-14 NOTE — Progress Notes (Signed)
      [address]       [name] [address]             [phone]      No complaints this AM, but wants to know when leak is going to stop  BP 99/82 (BP Location: Right Arm)   Pulse 96   Temp 98.2 F (36.8 C) (Oral)   Resp 16   Ht 5\' 5"  (1.651 m)   Wt 44.4 kg   SpO2 98%   BMI 16.29 kg/m   Lungs- diminished BS on left, audible chest tube sounds on right  + air leak  CXR unchanged small lateral pneumothorax on suction, valves in place  Lung did not reexpand on suction- will place back to water seal Hopefully valves will seal with some more time  Remo Lipps C. Roxan Hockey, MD [facility] [phone]

## 2019-10-15 ENCOUNTER — Inpatient Hospital Stay (HOSPITAL_COMMUNITY): Payer: Medicare Other

## 2019-10-15 LAB — BASIC METABOLIC PANEL
Anion gap: 9 (ref 5–15)
BUN: 6 mg/dL — ABNORMAL LOW (ref 8–23)
CO2: 27 mmol/L (ref 22–32)
Calcium: 9.3 mg/dL (ref 8.9–10.3)
Chloride: 105 mmol/L (ref 98–111)
Creatinine, Ser: 0.69 mg/dL (ref 0.44–1.00)
GFR calc Af Amer: >60 mL/min (ref >60)
GFR calc non Af Amer: >60 mL/min (ref >60)
Glucose, Bld: 97 mg/dL (ref 70–99)
Potassium: 4.4 mmol/L (ref 3.5–5.1)
Sodium: 141 mmol/L (ref 135–145)

## 2019-10-15 LAB — CBC
HCT: 33.4 % — ABNORMAL LOW (ref 36.0–46.0)
Hemoglobin: 10.4 g/dL — ABNORMAL LOW (ref 12.0–15.0)
MCH: 29.8 pg (ref 26.0–34.0)
MCHC: 31.1 g/dL (ref 30.0–36.0)
MCV: 95.7 fL (ref 80.0–100.0)
Platelets: 445 10*3/uL — ABNORMAL HIGH (ref 150–400)
RBC: 3.49 MIL/uL — ABNORMAL LOW (ref 3.87–5.11)
RDW: 14.8 % (ref 11.5–15.5)
WBC: 11 10*3/uL — ABNORMAL HIGH (ref 4.0–10.5)
nRBC: 0 % (ref 0.0–0.2)

## 2019-10-15 MED ORDER — MENTHOL 3 MG MT LOZG
1.0000 | LOZENGE | OROMUCOSAL | Status: DC | PRN
  Administered 2019-10-16 (×2): 3 mg via ORAL
  Filled 2019-10-15: qty 9

## 2019-10-15 NOTE — Progress Notes (Signed)
      [address]       [place] [address]             [phone]      3 Days Post-Op Procedure(s) (LRB): VIDEO BRONCHOSCOPY WITH INSERTION OF INTERBRONCHIAL VALVES (IBV)  TIMES THREE RIGHT UPPER LOBE , ONE MIDDLE LOBE. (N/A) Subjective: Feels okay this morning. She did have issues last night with coughing.   Objective: Vital signs in last 24 hours: Temp:  [97.7 F (36.5 C)-98.9 F (37.2 C)] 97.7 F (36.5 C) (01/16 0752) Pulse Rate:  [86-103] 94 (01/16 1000) Cardiac Rhythm: Normal sinus rhythm;Sinus tachycardia (01/15 2100) Resp:  [16-31] 31 (01/16 1000) BP: (95-124)/(58-68) 95/65 (01/16 1000) SpO2:  [96 %-99 %] 98 % (01/16 1000)     Intake/Output from previous day: 01/15 0701 - 01/16 0700 In: 243 [P.O.:240; I.V.:3] Out: 900 [Urine:900] Intake/Output this shift: No intake/output data recorded.  General appearance: alert, cooperative and no distress Heart: regular rate and rhythm, S1, S2 normal, no murmur, click, rub or gallop Lungs: clear to auscultation bilaterally Abdomen: soft, non-tender; bowel sounds normal; no masses,  no organomegaly Extremities: extremities normal, atraumatic, no cyanosis or edema Wound: clean and dry  Lab Results: Recent Labs    10/13/19 0641 10/15/19 0543  WBC 8.1 11.0*  HGB 9.5* 10.4*  HCT 30.5* 33.4*  PLT 483* 445*   BMET:  Recent Labs    10/13/19 0641 10/15/19 0543  NA 143 141  K 4.3 4.4  CL 106 105  CO2 28 27  GLUCOSE 107* 97  BUN 10 6*  CREATININE 0.92 0.69  CALCIUM 9.0 9.3    PT/INR: No results for input(s): LABPROT, INR in the last 72 hours. ABG No results found for: PHART, HCO3, TCO2, ACIDBASEDEF, O2SAT CBG (last 3)  No results for input(s): GLUCAP in the last 72 hours.  Assessment/Plan: S/P Procedure(s) (LRB): VIDEO BRONCHOSCOPY WITH INSERTION OF INTERBRONCHIAL VALVES (IBV)  TIMES THREE RIGHT UPPER LOBE , ONE MIDDLE LOBE. (N/A)  1. CV-NSR 2. Pulm-s/p IBV on 1/13. On 3 L oxygen. Pulling  750 on the IS. CXR improved today so will keep on WS for now. 1+ air leak at baseline and 2+ air leak with cough.  3. Mild dementia at baseline 4. Dry mouth with coughing especially at night. On cough medicine already. Ordered lozenges and encouraged fluid intake. Only drinking 3 small cups a day of water-encouraged to double that today. 5. + COVID back in November per patient.    Plan: Continue incentive spirometer. Ambulate in the halls if able. Keep to [facility] today. CXR in the morning.     LOS: 16 days    Elgie Collard 10/15/2019

## 2019-10-15 NOTE — Progress Notes (Signed)
Nurse called RT to patients room at the request of the patients daughter because the mother has shortness of breath due to eating and drinking. RT came and assessed the patient for SOB due to eating. The patient is sating fine at 97%. The patient does not have any type of WOB. The patient stated she becomes short of breath when eating or drinking and it takes her a few minutes to catch her breath. I have asked the provider if he feels the patient needs a consult with Speech Therapy for a swallow test or ENT to make sure there are no issues with the Trachea or Esophagus.

## 2019-10-15 NOTE — Progress Notes (Signed)
PROGRESS NOTE  Amanda [name] [id number] DOB: [birth year] DOA: 09/29/2019 PCP: Lemmie Evens, MD   LOS: 16 days   Brief narrative: As per HPI,  This is a 79 year old female with severe COPD and mild dementia admitted to the hospital this time. Patient did have recurrent 3rd or 4th spontaneous PTX in past year. Recently had Covid-19 09/21/19) and son just died from it. Had chest tube placed in  ED 12/31 with improvement in dyspnea and PTX. Patient was seen by cardiothoracic surgery.  1/6-chest tube to waterseal-repeat chest x-ray showing worsening pneumothorax chest tube placed back to suction 1/9-chest tube was dislodged but patient not in distress toTCTS calledIR to place PIG tailcatheterwhich was done 1/9. 1/10: Suction increased. 1/12:  Status post IBV valve  Assessment/Plan:  Principal Problem:   Recurrent spontaneous pneumothorax Active Problems:   SOB (shortness of breath)   Spontaneous pneumothorax   Dementia (HCC)   Protein-calorie malnutrition, severe   Emphysema/COPD (HCC)   Hypokalemia  Recurrent spontaneous pneumothorax Patient had chest tube waterseal but with persistent air leakage.  So patient underwent  Video bronchoscopy with 3 right upper lobe segmental bronchi and one right middle lobe bronchus valve placement on 10/12/19. CT surgery on board.  Follow CT surgery recommendations.  Chest x-ray from 10/14/2019 shows a right apical pneumothorax which is decreasing in size.  Mild dementia with sundowning,confusion. Improved confusion. On low dose seroquel. Urinalysis was negative.  TSH, ammonia and B12 within normal limits.  History of chronic benzos and scheduled oxycodone.  Currently on tramadol/percocet  Severe emphysema/COPD/chronic hypoxic respiratory failure on 3 L oxygen by nasal cannula at home. Continue Symbicort and albuterol.  Continue supplemental oxygen, currently on 3 L of oxygen.  Hyperkalemia resolved.    Chronic normocytic anemia stable,  monitor CBC closely.  History of COVID-19 PCR and antigen negative on admission   VTE Prophylaxis: Lovenox  Code Status: Partial code  Family Communication: Spoke with the patient's daughter Lynelle Smoke on the phone.  Disposition Plan: Home with home health , will need wheel chair on discharge.  Follow CT surgery recommendations on discharge.  Consultants: Cardiothoracic surgery Interventional radiology  Procedures: 1/6-chest tube to waterseal-repeat chest x-ray showing worsening pneumothorax chest tube placed back to suction 1/9-chest tube was dislodged but patient not in distress toTCTS calledIR to place PIG tailcatheterwhich was done 1/9 PM 1/13-status post video bronchoscopy with placement of IBV  Antibiotics:  Anti-infectives (From admission, onward)   None     Subjective: Today, patient complains of mild cough and nausea.  Denies overt chest pain.  No fever chills or rigor.  Objective: Vitals:   10/14/19 2300 10/15/19 0325  BP: (!) 120/58 95/65  Pulse: 98 86  Resp: 16 20  Temp: 98 F (36.7 C) 97.8 F (36.6 C)  SpO2: 97% 98%    Intake/Output Summary (Last 24 hours) at 10/15/2019 0704 Last data filed at 10/14/2019 2111 Gross per 24 hour  Intake 243 ml  Output 900 ml  Net -657 ml   Filed Weights   09/29/19 1401 09/30/19 0005 10/04/19 0303  Weight: 47.6 kg 47 kg 44.4 kg   Body mass index is 16.29 kg/m.   Physical Exam:  GENERAL: Alert awake and communicative.  Not in obvious distress. On  Nasal cannula oxygen at 3 L/min. HENT: No scleral pallor or icterus. Pupils equally reactive to light. Oral mucosa is moist NECK: is supple, no palpable thyroid enlargement. CHEST: Diminished breath sounds bilateral.  Right chest wall chest tube in place.  CVS: S1 and S2 heard, no murmur. Regular rate and rhythm. No pericardial rub. ABDOMEN: Soft, non-tender, bowel sounds are present.  Urostomy bag in place. EXTREMITIES: No edema. CNS: Cranial nerves are intact. No  focal motor or sensory deficits. SKIN: warm and dry without rashes.  Data Review: I have personally reviewed the following laboratory data and studies,  CBC: Recent Labs  Lab 10/09/19 1410 10/11/19 0216 10/12/19 0354 10/13/19 0641 10/15/19 0543  WBC 6.7 7.0 8.3 8.1 11.0*  HGB 9.4* 9.9* 10.1* 9.5* 10.4*  HCT 29.7* 31.9* 31.2* 30.5* 33.4*  MCV 94.0 96.4 93.4 95.9 95.7  PLT 478* 503* 499* 483* [id number]*   Basic Metabolic Panel: Recent Labs  Lab 10/09/19 1410 10/11/19 0216 10/12/19 0354 10/13/19 0641 10/15/19 0543  NA 138 141 140 143 141  K 4.6 4.6 4.0 4.3 4.4  CL 104 102 103 106 105  CO2 28 28 26 28 27   GLUCOSE 105* 97 113* 107* 97  BUN 13 13 13 10  6*  CREATININE 0.66 0.84 0.73 0.92 0.69  CALCIUM 9.3 9.6 9.1 9.0 9.3  MG  --  2.1 2.0 1.8  --   PHOS  --  4.4  --   --   --    Liver Function Tests: Recent Labs  Lab 10/09/19 1410  AST 21  ALT 21  ALKPHOS 61  BILITOT 0.5  PROT 6.2*  ALBUMIN 2.1*   No results for input(s): LIPASE, AMYLASE in the last 168 hours. No results for input(s): AMMONIA in the last 168 hours. Cardiac Enzymes: No results for input(s): CKTOTAL, CKMB, CKMBINDEX, TROPONINI in the last 168 hours. BNP (last 3 results) No results for input(s): BNP in the last 8760 hours.  ProBNP (last 3 results) No results for input(s): PROBNP in the last 8760 hours.  CBG: No results for input(s): GLUCAP in the last 168 hours. Recent Results (from the past 240 hour(s))  Surgical pcr screen     Status: None   Collection Time: 10/10/19 12:00 AM   Specimen: Nasal Mucosa; Nasal Swab  Result Value Ref Range Status   MRSA, PCR NEGATIVE NEGATIVE Final   Staphylococcus aureus NEGATIVE NEGATIVE Final    Comment: (NOTE) The Xpert SA Assay (FDA approved for NASAL specimens in patients 17 years of age and older), is one component of a comprehensive surveillance program. It is not intended to diagnose infection nor to guide or monitor treatment. Performed at [facility], [place] [address], [place], [place] [address]      Studies: DG CHEST PORT 1 VIEW  Result Date: 10/14/2019 CLINICAL DATA:  Pneumothorax.  Chest tube. EXAM: PORTABLE CHEST 1 VIEW COMPARISON:  10/13/2019. FINDINGS: Right chest tube in stable position. Right pneumothorax unchanged. Heart size stable. Endobronchial valves noted stable position. Chronic interstitial changes again noted. Stable bilateral pleural thickening noted. No acute bony abnormality identified. IMPRESSION: 1. Right chest tube in stable position. Right pneumothorax unchanged. 2. Chronic interstitial changes are again noted. No interim change. Electronically Signed   By: Marcello Moores  Register   On: 10/14/2019 07:52    Scheduled Meds: . docusate sodium  100 mg Oral BID  . enoxaparin (LOVENOX) injection  40 mg Subcutaneous Q24H  . feeding supplement  1 Container Oral TID BM  . mouth rinse  15 mL Mouth Rinse BID  . mometasone-formoterol  2 puff Inhalation BID  . multivitamin with minerals  1 tablet Oral Daily  . QUEtiapine  25 mg Oral QPM  . senna  1 tablet  Oral Daily  . sodium chloride flush  3 mL Intravenous Q12H    Continuous Infusions: . sodium chloride       Flora Lipps, MD  [facility] 10/15/2019

## 2019-10-16 ENCOUNTER — Inpatient Hospital Stay (HOSPITAL_COMMUNITY): Payer: Medicare Other

## 2019-10-16 NOTE — Plan of Care (Signed)
  Problem: Clinical Measurements: Goal: Cardiovascular complication will be avoided Outcome: Progressing   Problem: Coping: Goal: Level of anxiety will decrease Outcome: Progressing   Problem: Elimination: Goal: Will not experience complications related to bowel motility Outcome: Progressing Goal: Will not experience complications related to urinary retention Outcome: Progressing   Problem: Pain Managment: Goal: General experience of comfort will improve Outcome: Progressing

## 2019-10-16 NOTE — Evaluation (Signed)
Clinical/Bedside Swallow Evaluation Patient Details  Name: Amanda [name] MRN: [id number] Date of Birth: 1941/08/22  Today's Date: 10/16/2019 Time: SLP Start Time (ACUTE ONLY): 1205 SLP Stop Time (ACUTE ONLY): 1235 SLP Time Calculation (min) (ACUTE ONLY): 30 min  Past Medical History:  Past Medical History:  Diagnosis Date  . Arthritis   . Bladder cancer ([place])   . Blood transfusion without reported diagnosis   . COPD (chronic obstructive pulmonary disease) ([place])   . COVID-19 August 26, 2019  . Renal disorder    kidney stones  . Seizures ([place])   . Spontaneous pneumothorax - recurrent 05/2018   right   Past Surgical History:  Past Surgical History:  Procedure Laterality Date  . ABDOMINAL HYSTERECTOMY    . CYSTECTOMY W/ URETEROILEAL CONDUIT  2009  . VIDEO BRONCHOSCOPY WITH INSERTION OF INTERBRONCHIAL VALVE (IBV) N/A 10/12/2019   Procedure: VIDEO BRONCHOSCOPY WITH INSERTION OF INTERBRONCHIAL VALVES (IBV)  TIMES THREE RIGHT UPPER LOBE , ONE MIDDLE LOBE.;  Surgeon: [name], MD;  Location: [facility] OR;  Service: Thoracic;  Laterality: N/A;   HPI:  This is a 79 year old female with severe COPD and mild dementia admitted to the hospital this time. Patient did have recurrent 3rd or 4th spontaneous PTX in past year. Recently had Covid-19 2019/08/26) and son just died from it. Had chest tube placed in  ED 12/31 with improvement in dyspnea and PTX.  RT documented that pt reported SOB during eating/drinking.  No previously documented dysphagia hx.    Assessment / Plan / Recommendation Clinical Impression  Pt presents with oral dysphagia and suspected pharyngeal/esophageal dysphagia.  She was encountered awake/alert with daughter present at bedside.  She reported hx of intermittent reflux and occasional coughing during PO consumption (a few times per week).  Oral mechanism exam was remarkable for edentulism on the bottom and top dentures. She consumed trials of thin liquid, mixed consistency with  soft solids (chili) and regular solids.  She exhibited an immediate cough following 1/4 straw sips of thin liquid and a delayed cough following [place] 3oz water challenge via cup sip.  Additionally observed intermittent eructation with liquid trials.  Mastication of regular solids was significantly prolonged and a delayed cough was observed >3 minutes following swallow initiation.  Suspect esophageal etiology.  Cough was observed to be congested and weak.  No clinical s/sx of aspiration were observed with soft solids and no SOB or changes in vitals were observed with any PO trials on this date.  Recommend diet change to Dysphagia 3 (soft) solids and continuation of thin liquids (No Straws) with meds whole in puree and full supervision to cue for the following compensatory strategies: 1) Small bites/sips 2) Slow rate of intake 3) Sit upright 90 degrees 4) Remain upright for 30+ minutes after PO intake.  Spoke with pt and daughter in depth regarding recommendations and they verbalized understanding.  SLP will f/u to monitor diet tolerance per POC.   SLP Visit Diagnosis: Dysphagia, unspecified (R13.10)    Aspiration Risk  Mild aspiration risk    Diet Recommendation Dysphagia 3 (Mech soft);Thin liquid   Liquid Administration via: Cup;No straw Medication Administration: Whole meds with puree Supervision: Full supervision/cueing for compensatory strategies Compensations: Minimize environmental distractions;Slow rate;Small sips/bites Postural Changes: Seated upright at 90 degrees;Remain upright for at least 30 minutes after po intake    Other  Recommendations Oral Care Recommendations: Oral care BID   Follow up Recommendations 24 hour supervision/assistance      Frequency and Duration min  2x/week  2 weeks       Prognosis Prognosis for Safe Diet Advancement: Fair      Swallow Study   General Date of Onset: 10/16/19 HPI: This is a 79 year old female with severe COPD and mild dementia admitted to  the hospital this time. Patient did have recurrent 3rd or 4th spontaneous PTX in past year. Recently had Covid-19 2019-09-05) and son just died from it. Had chest tube placed in  ED 12/31 with improvement in dyspnea and PTX.  RT documented that pt reported SOB during eating/drinking.  No previously documented dysphagia hx.  Type of Study: Bedside Swallow Evaluation Previous Swallow Assessment: None documented  Diet Prior to this Study: Regular;Thin liquids Temperature Spikes Noted: No Respiratory Status: Nasal cannula History of Recent Intubation: Yes Length of Intubations (days): (less than 1 for procedure ) Date extubated: 10/12/19 Behavior/Cognition: Alert;Cooperative;Pleasant mood Oral Cavity Assessment: Within Functional Limits Oral Care Completed by SLP: No Oral Cavity - Dentition: Dentures, top;Missing dentition(edentulous on bottom ) Vision: Functional for self-feeding Self-Feeding Abilities: Able to feed self;Needs set up Patient Positioning: Upright in bed Baseline Vocal Quality: Low vocal intensity Volitional Cough: Weak;Congested Volitional Swallow: Able to elicit    Oral/Motor/Sensory Function Overall Oral Motor/Sensory Function: Within functional limits   Ice Chips Ice chips: Not tested   Thin Liquid Thin Liquid: Impaired Presentation: Straw;Cup Pharyngeal  Phase Impairments: Suspected delayed Swallow;Cough - Immediate;Cough - Delayed    Nectar Thick Nectar Thick Liquid: Not tested   Honey Thick Honey Thick Liquid: Not tested   Puree Puree: Not tested   Solid     Solid: Impaired Presentation: Self Fed Oral Phase Impairments: Impaired mastication Oral Phase Functional Implications: Impaired mastication;Prolonged oral transit Pharyngeal Phase Impairments: Cough - Delayed     Colin Mulders M.S., CCC-SLP Acute Rehabilitation Services Office: [phone]  Elvia Collum Zani Kyllonen 10/16/2019,12:49 PM

## 2019-10-16 NOTE — Progress Notes (Signed)
Patient states she is still having issues with Shortness of Breath when eating and drinking. RT has asked for a ENT or Speech Therapy Consult for this issue.

## 2019-10-16 NOTE — Progress Notes (Addendum)
      [address]       [place] [address]             [phone]      4 Days Post-Op Procedure(s) (LRB): VIDEO BRONCHOSCOPY WITH INSERTION OF INTERBRONCHIAL VALVES (IBV)  TIMES THREE RIGHT UPPER LOBE , ONE MIDDLE LOBE. (N/A) Subjective: Was nauseated this morning but feeling better after zofran and able to tolerate a banana.   Objective: Vital signs in last 24 hours: Temp:  [97.4 F (36.3 C)-98.5 F (36.9 C)] 97.4 F (36.3 C) (01/17 0937) Pulse Rate:  [93-104] 93 (01/17 0937) Cardiac Rhythm: Normal sinus rhythm (01/17 0701) Resp:  [13-37] 18 (01/17 0937) BP: (100-118)/(51-63) 113/56 (01/17 0937) SpO2:  [96 %-97 %] 96 % (01/17 0937) FiO2 (%):  [3 %] 3 % (01/17 0300)     Intake/Output from previous day: 01/16 0701 - 01/17 0700 In: 243 [P.O.:240; I.V.:3] Out: 1925 [Urine:1225; Drains:700] Intake/Output this shift: No intake/output data recorded.  General appearance: alert, cooperative and no distress Heart: regular rate and rhythm, S1, S2 normal, no murmur, click, rub or gallop Lungs: clear to auscultation bilaterally Abdomen: soft, non-tender; bowel sounds normal; no masses,  no organomegaly Extremities: extremities normal, atraumatic, no cyanosis or edema Wound: clean and dry  Lab Results: Recent Labs    10/15/19 0543  WBC 11.0*  HGB 10.4*  HCT 33.4*  PLT 445*   BMET:  Recent Labs    10/15/19 0543  NA 141  K 4.4  CL 105  CO2 27  GLUCOSE 97  BUN 6*  CREATININE 0.69  CALCIUM 9.3    PT/INR: No results for input(s): LABPROT, INR in the last 72 hours. ABG No results found for: PHART, HCO3, TCO2, ACIDBASEDEF, O2SAT CBG (last 3)  No results for input(s): GLUCAP in the last 72 hours.  Assessment/Plan: S/P Procedure(s) (LRB): VIDEO BRONCHOSCOPY WITH INSERTION OF INTERBRONCHIAL VALVES (IBV)  TIMES THREE RIGHT UPPER LOBE , ONE MIDDLE LOBE. (N/A)   1. CV-NSR 2. Pulm-s/p IBV on 1/13. On 3 L oxygen with excellent saturation. Pulling  750 on the IS. CXR improved today so will keep on WS for now. 1+ air leak at baseline and 3+ air leak with cough.  3. Mild dementia at baseline 4. Dry mouth with coughing especially at night. On cough medicine already. Ordered lozenges and encouraged fluid intake. Encourage PO intake. Was nauseated this morning but improved with Zofran.  5. + COVID back in November per patient.   6. SLP for possible aspiration. Swallow scheduled for tomorrow.  Plan: Keep patient to water seal. CXR improved but air leak remains. Discussed a mini express with the patient.    LOS: 17 days    Amanda [name] 10/16/2019   ptx decreased and stable on water seal , leave on water seal , consider mini express   Grace Isaac MD Beeper [phone] Office [phone] 10/16/2019 12:16 PM

## 2019-10-16 NOTE — Progress Notes (Signed)
PROGRESS NOTE  Amanda [name] [id number] DOB: [birth year] DOA: 09/29/2019 PCP: Lemmie Evens, MD   LOS: 17 days   Brief narrative: As per HPI,  This is a 79 year old female with severe COPD and mild dementia admitted to the hospital this time. Patient did have recurrent 3rd or 4th spontaneous PTX in past year. Recently had Covid-19 09-14-2019) and son just died from it. Had chest tube placed in  ED 12/31 with improvement in dyspnea and PTX. Patient was seen by cardiothoracic surgery.  1/6-chest tube to waterseal-repeat chest x-ray showing worsening pneumothorax chest tube placed back to suction 1/9-chest tube was dislodged but patient not in distress toTCTS calledIR to place PIG tailcatheterwhich was done 1/9. 1/10: Suction increased. 1/12:  Status post IBV valve  Assessment/Plan:  Principal Problem:   Recurrent spontaneous pneumothorax Active Problems:   SOB (shortness of breath)   Spontaneous pneumothorax   Dementia (HCC)   Protein-calorie malnutrition, severe   Emphysema/COPD (HCC)   Hypokalemia  Recurrent spontaneous pneumothorax Status post  Video bronchoscopy with 3 right upper lobe segmental bronchi and one right middle lobe bronchus valve placement on 10/12/19. CT surgery on board.  Follow CT surgery recommendations.  Chest x-ray from 10/15/2019 shows a right apical pneumothorax which is decreasing in size. Will continue to follow CT surgery recommendations.  Mild dementia with sundowning,confusion. Improved confusion. On low dose seroquel. Urinalysis was negative.  TSH, ammonia and B12 within normal limits.  History of chronic benzos and scheduled oxycodone.  Currently on tramadol/percocet  Severe emphysema/COPD/chronic hypoxic respiratory failure on 3 L oxygen by nasal cannula at home. Continue Symbicort and albuterol.  Continue supplemental oxygen, currently on 3 L of oxygen saturating at 96%  Hyperkalemia resolved.    Chronic normocytic anemia stable, monitor CBC  closely.  History of COVID-19 PCR and antigen negative on admission   VTE Prophylaxis: Lovenox  Code Status: Partial code  Family Communication: None today.   Disposition Plan: Home with home health , will need wheel chair on discharge.  Follow CT surgery recommendations on discharge.  Consultants: Cardiothoracic surgery Interventional radiology  Procedures: 1/6-chest tube to waterseal-repeat chest x-ray showing worsening pneumothorax chest tube placed back to suction 1/9-chest tube was dislodged but patient not in distress toTCTS calledIR to place PIG tailcatheterwhich was done 1/9 PM 1/13-status post video bronchoscopy with placement of IBV  Antibiotics:  Anti-infectives (From admission, onward)   None     Subjective: Today, patient complains of nausea and dyspnea on eating.  Mild cough+ No fever or chills.   Objective: Vitals:   10/16/19 0300 10/16/19 0451  BP: 109/63 116/62  Pulse: 93 94  Resp: (!) 33 (!) 37  Temp: 97.8 F (36.6 C)   SpO2: 96% 96%    Intake/Output Summary (Last 24 hours) at 10/16/2019 0733 Last data filed at 10/16/2019 0600 Gross per 24 hour  Intake 243 ml  Output 1925 ml  Net -1682 ml   Filed Weights   09/29/19 1401 09/30/19 0005 10/04/19 0303  Weight: 47.6 kg 47 kg 44.4 kg   Body mass index is 16.29 kg/m.   Physical Exam:  GENERAL: Alert awake and communicative.  Not in obvious distress. On  Nasal cannula oxygen at 3 L/min. HENT: No scleral pallor or icterus. Pupils equally reactive to light. Oral mucosa is moist NECK: is supple, no palpable thyroid enlargement. CHEST: Diminished breath sounds bilateral.  Right chest wall chest tube in place. CVS: S1 and S2 heard, no murmur. Regular rate and rhythm. No  pericardial rub. ABDOMEN: Soft, non-tender, bowel sounds are present.  Urostomy bag in place. EXTREMITIES: No edema. CNS: Cranial nerves are intact. No focal motor or sensory deficits. SKIN: warm and dry without  rashes.  Data Review: I have personally reviewed the following laboratory data and studies,  CBC: Recent Labs  Lab 10/09/19 1410 10/11/19 0216 10/12/19 0354 10/13/19 0641 10/15/19 0543  WBC 6.7 7.0 8.3 8.1 11.0*  HGB 9.4* 9.9* 10.1* 9.5* 10.4*  HCT 29.7* 31.9* 31.2* 30.5* 33.4*  MCV 94.0 96.4 93.4 95.9 95.7  PLT 478* 503* 499* 483* [id number]*   Basic Metabolic Panel: Recent Labs  Lab 10/09/19 1410 10/11/19 0216 10/12/19 0354 10/13/19 0641 10/15/19 0543  NA 138 141 140 143 141  K 4.6 4.6 4.0 4.3 4.4  CL 104 102 103 106 105  CO2 28 28 26 28 27   GLUCOSE 105* 97 113* 107* 97  BUN 13 13 13 10  6*  CREATININE 0.66 0.84 0.73 0.92 0.69  CALCIUM 9.3 9.6 9.1 9.0 9.3  MG  --  2.1 2.0 1.8  --   PHOS  --  4.4  --   --   --    Liver Function Tests: Recent Labs  Lab 10/09/19 1410  AST 21  ALT 21  ALKPHOS 61  BILITOT 0.5  PROT 6.2*  ALBUMIN 2.1*   No results for input(s): LIPASE, AMYLASE in the last 168 hours. No results for input(s): AMMONIA in the last 168 hours. Cardiac Enzymes: No results for input(s): CKTOTAL, CKMB, CKMBINDEX, TROPONINI in the last 168 hours. BNP (last 3 results) No results for input(s): BNP in the last 8760 hours.  ProBNP (last 3 results) No results for input(s): PROBNP in the last 8760 hours.  CBG: No results for input(s): GLUCAP in the last 168 hours. Recent Results (from the past 240 hour(s))  Surgical pcr screen     Status: None   Collection Time: 10/10/19 12:00 AM   Specimen: Nasal Mucosa; Nasal Swab  Result Value Ref Range Status   MRSA, PCR NEGATIVE NEGATIVE Final   Staphylococcus aureus NEGATIVE NEGATIVE Final    Comment: (NOTE) The Xpert SA Assay (FDA approved for NASAL specimens in patients 96 years of age and older), is one component of a comprehensive surveillance program. It is not intended to diagnose infection nor to guide or monitor treatment. Performed at [facility], [place] [address] [place], [place] [address]       Studies: DG Chest Port 1 View  Result Date: 10/15/2019 CLINICAL DATA:  Right-sided pneumothorax. EXAM: PORTABLE CHEST 1 VIEW COMPARISON:  One-view chest x-ray 10/14/2019 FINDINGS: Pigtail catheter remains in place within the right hemithorax. The right apical pneumothorax has decreased in size. There is scarring at the lung apices bilaterally. Bibasilar airspace opacities are noted. Right greater than left pleural effusions are present. IMPRESSION: 1. Decreasing size of right apical pneumothorax. 2. Bibasilar airspace disease likely reflects atelectasis. 3. Right greater than left pleural effusions. Electronically Signed   By: San Morelle M.D.   On: 10/15/2019 10:22    Scheduled Meds: . docusate sodium  100 mg Oral BID  . enoxaparin (LOVENOX) injection  40 mg Subcutaneous Q24H  . feeding supplement  1 Container Oral TID BM  . mouth rinse  15 mL Mouth Rinse BID  . mometasone-formoterol  2 puff Inhalation BID  . multivitamin with minerals  1 tablet Oral Daily  . QUEtiapine  25 mg Oral QPM  . senna  1 tablet Oral Daily  .  sodium chloride flush  3 mL Intravenous Q12H    Continuous Infusions: . sodium chloride       Flora Lipps, MD  [facility] 10/16/2019

## 2019-10-17 ENCOUNTER — Inpatient Hospital Stay (HOSPITAL_COMMUNITY): Payer: Medicare Other

## 2019-10-17 LAB — COMPREHENSIVE METABOLIC PANEL
ALT: 14 U/L (ref 0–44)
AST: 16 U/L (ref 15–41)
Albumin: 2.4 g/dL — ABNORMAL LOW (ref 3.5–5.0)
Alkaline Phosphatase: 60 U/L (ref 38–126)
Anion gap: 10 (ref 5–15)
BUN: 6 mg/dL — ABNORMAL LOW (ref 8–23)
CO2: 27 mmol/L (ref 22–32)
Calcium: 9.2 mg/dL (ref 8.9–10.3)
Chloride: 103 mmol/L (ref 98–111)
Creatinine, Ser: 0.82 mg/dL (ref 0.44–1.00)
GFR calc Af Amer: >60 mL/min (ref >60)
GFR calc non Af Amer: >60 mL/min (ref >60)
Glucose, Bld: 108 mg/dL — ABNORMAL HIGH (ref 70–99)
Potassium: 4 mmol/L (ref 3.5–5.1)
Sodium: 140 mmol/L (ref 135–145)
Total Bilirubin: 0.5 mg/dL (ref 0.3–1.2)
Total Protein: 6.1 g/dL — ABNORMAL LOW (ref 6.5–8.1)

## 2019-10-17 LAB — CBC
HCT: 32.6 % — ABNORMAL LOW (ref 36.0–46.0)
Hemoglobin: 10.3 g/dL — ABNORMAL LOW (ref 12.0–15.0)
MCH: 29.9 pg (ref 26.0–34.0)
MCHC: 31.6 g/dL (ref 30.0–36.0)
MCV: 94.8 fL (ref 80.0–100.0)
Platelets: 407 10*3/uL — ABNORMAL HIGH (ref 150–400)
RBC: 3.44 MIL/uL — ABNORMAL LOW (ref 3.87–5.11)
RDW: 14.7 % (ref 11.5–15.5)
WBC: 8.4 10*3/uL (ref 4.0–10.5)
nRBC: 0 % (ref 0.0–0.2)

## 2019-10-17 LAB — MAGNESIUM: Magnesium: 2 mg/dL (ref 1.7–2.4)

## 2019-10-17 NOTE — Progress Notes (Addendum)
PROGRESS NOTE  Amanda [name] [id number] DOB: 16-Oct-1940 DOA: 09/29/2019 PCP: Lemmie Evens, MD   LOS: 18 days   Brief narrative: As per HPI,  This is a 79 year old female with severe COPD and mild dementia admitted to the hospital this time. Patient did have recurrent 3rd or 4th spontaneous PTX in past year. Recently had Covid-19 September 04, 2019) and son just died from it. Had chest tube placed in  ED 12/31 with improvement in dyspnea and PTX. Patient was seen by cardiothoracic surgery.  1/6-chest tube to waterseal-repeat chest x-ray showing worsening pneumothorax chest tube placed back to suction 1/9-chest tube was dislodged but patient not in distress toTCTS calledIR to place PIG tailcatheterwhich was done 1/9. 1/10: Suction increased. 1/12:  Status post IBV valve  Assessment/Plan:  Principal Problem:   Recurrent spontaneous pneumothorax Active Problems:   SOB (shortness of breath)   Spontaneous pneumothorax   Dementia (HCC)   Protein-calorie malnutrition, severe   Emphysema/COPD (HCC)   Hypokalemia  Recurrent spontaneous pneumothorax Status post  video bronchoscopy with 3 right upper lobe segmental bronchi and one right middle lobe bronchus valve placement on 10/12/19. CT surgery on board.  Follow CT surgery recommendations.  Chest x-ray from 10/16/2019 shows a right apical pneumothorax which is decreasing in size. Will continue to follow CT surgery recommendations. CT surgery considering Mini express.  Daily chest x-ray as per CT surgery  Mild dementia with sundowning,confusion.  On low dose seroquel. Urinalysis was negative.  TSH, ammonia and B12 within normal limits.  History of chronic benzos and scheduled oxycodone.  Currently on tramadol/percocet  Severe emphysema/COPD/chronic hypoxic respiratory failure on 3 L oxygen by nasal cannula at home. Continue Symbicort and albuterol.  Continue supplemental oxygen, currently on 3 L of oxygen saturating at 100%  Hyperkalemia  resolved.    Chronic normocytic anemia stable, monitor CBC closely.  Concern for dysphagia.  Seen by speech therapy and recommended dysphagia 3 diet.  History of COVID-19 PCR and antigen negative on admission  VTE Prophylaxis: Lovenox  Code Status: Partial code  Family Communication:  None today.  Disposition Plan: Home with home health , Will need wheel chair on discharge.  Follow CT surgery recommendations on further care and disposition.  Consultants: Cardiothoracic surgery Interventional radiology  Procedures: 1/6-chest tube to waterseal-repeat chest x-ray showing worsening pneumothorax chest tube placed back to suction 1/9-chest tube was dislodged but patient not in distress toTCTS calledIR to place PIG tailcatheterwhich was done 1/9 PM 1/13-status post video bronchoscopy with placement of IBV  Antibiotics:  Anti-infectives (From admission, onward)   None     Subjective: Today, patient is anxious about the discharge home.  Denies any increasing shortness of breath.  Has mild cough.  No nausea today.  Objective: Vitals:   10/17/19 0048 10/17/19 0300  BP:  (!) 94/50  Pulse: 89 88  Resp: (!) 24 20  Temp:  98 F (36.7 C)  SpO2: 100% 100%    Intake/Output Summary (Last 24 hours) at 10/17/2019 0725 Last data filed at 10/17/2019 0600 Gross per 24 hour  Intake --  Output 1350 ml  Net -1350 ml   Filed Weights   09/29/19 1401 09/30/19 0005 10/04/19 0303  Weight: 47.6 kg 47 kg 44.4 kg   Body mass index is 16.29 kg/m.   Physical Exam:  General: Thinly built, not in obvious distress, on nasal cannula 3 L/min, mildly anxious.  Awake and communicative. HENT: Normocephalic, pupils equally reacting to light and accommodation.  No scleral pallor or  icterus noted. Oral mucosa is moist.  Chest: Diminished breath sounds bilaterally. Chest wall with chest tube in place-to waterseal..  Coarse breath sounds on the right side CVS: S1 &S2 heard. No murmur.  Regular  rate and rhythm. Abdomen: Soft, nontender, nondistended.  Bowel sounds are heard. Urostomy bag in place. extremities: No cyanosis, clubbing or edema.  Peripheral pulses are palpable. Psych: Alert, awake and oriented to place and person, normal mood CNS:  No cranial nerve deficits.  Power equal in all extremities.  No sensory deficits noted.  No cerebellar signs.   Skin: Warm and dry.  No rashes noted.   Data Review: I have personally reviewed the following laboratory data and studies,  CBC: Recent Labs  Lab 10/11/19 0216 10/12/19 0354 10/13/19 0641 10/15/19 0543 10/17/19 0207  WBC 7.0 8.3 8.1 11.0* 8.4  HGB 9.9* 10.1* 9.5* 10.4* 10.3*  HCT 31.9* 31.2* 30.5* 33.4* 32.6*  MCV 96.4 93.4 95.9 95.7 94.8  PLT 503* 499* 483* 445* [id number]*   Basic Metabolic Panel: Recent Labs  Lab 10/11/19 0216 10/12/19 0354 10/13/19 0641 10/15/19 0543 10/17/19 0207  NA 141 140 143 141 140  K 4.6 4.0 4.3 4.4 4.0  CL 102 103 106 105 103  CO2 28 26 28 27 27   GLUCOSE 97 113* 107* 97 108*  BUN 13 13 10  6* 6*  CREATININE 0.84 0.73 0.92 0.69 0.82  CALCIUM 9.6 9.1 9.0 9.3 9.2  MG 2.1 2.0 1.8  --  2.0  PHOS 4.4  --   --   --   --    Liver Function Tests: Recent Labs  Lab 10/17/19 0207  AST 16  ALT 14  ALKPHOS 60  BILITOT 0.5  PROT 6.1*  ALBUMIN 2.4*   No results for input(s): LIPASE, AMYLASE in the last 168 hours. No results for input(s): AMMONIA in the last 168 hours. Cardiac Enzymes: No results for input(s): CKTOTAL, CKMB, CKMBINDEX, TROPONINI in the last 168 hours. BNP (last 3 results) No results for input(s): BNP in the last 8760 hours.  ProBNP (last 3 results) No results for input(s): PROBNP in the last 8760 hours.  CBG: No results for input(s): GLUCAP in the last 168 hours. Recent Results (from the past 240 hour(s))  Surgical pcr screen     Status: None   Collection Time: 10/10/19 12:00 AM   Specimen: Nasal Mucosa; Nasal Swab  Result Value Ref Range Status   MRSA, PCR NEGATIVE  NEGATIVE Final   Staphylococcus aureus NEGATIVE NEGATIVE Final    Comment: (NOTE) The Xpert SA Assay (FDA approved for NASAL specimens in patients 18 years of age and older), is one component of a comprehensive surveillance program. It is not intended to diagnose infection nor to guide or monitor treatment. Performed at [facility], [place] [address] [place], [place] [address]      Studies: DG Chest Port 1 View  Result Date: 10/16/2019 CLINICAL DATA:  Pneumothorax. EXAM: PORTABLE CHEST 1 VIEW COMPARISON:  One-view chest x-ray 10/15/2019 FINDINGS: The heart size is normal. Atherosclerotic calcifications are present at the aortic arch. Small right apical pneumothorax is stable. Chest tube is stable in position. Small right pleural effusion is present. Bibasilar airspace opacities have not significantly changed. IMPRESSION: 1. Stable small right apical pneumothorax with chest tube in place. 2. Small right pleural effusion. 3. Bibasilar airspace disease, likely atelectasis. Electronically Signed   By: San Morelle M.D.   On: 10/16/2019 09:33   DG Chest [facility]  Result Date: 10/15/2019 CLINICAL DATA:  Right-sided pneumothorax. EXAM: PORTABLE CHEST 1 VIEW COMPARISON:  One-view chest x-ray 10/14/2019 FINDINGS: Pigtail catheter remains in place within the right hemithorax. The right apical pneumothorax has decreased in size. There is scarring at the lung apices bilaterally. Bibasilar airspace opacities are noted. Right greater than left pleural effusions are present. IMPRESSION: 1. Decreasing size of right apical pneumothorax. 2. Bibasilar airspace disease likely reflects atelectasis. 3. Right greater than left pleural effusions. Electronically Signed   By: San Morelle M.D.   On: 10/15/2019 10:22    Scheduled Meds: . docusate sodium  100 mg Oral BID  . enoxaparin (LOVENOX) injection  40 mg Subcutaneous Q24H  . feeding supplement  1 Container Oral TID BM  . mouth rinse  15  mL Mouth Rinse BID  . mometasone-formoterol  2 puff Inhalation BID  . multivitamin with minerals  1 tablet Oral Daily  . QUEtiapine  25 mg Oral QPM  . senna  1 tablet Oral Daily  . sodium chloride flush  3 mL Intravenous Q12H    Continuous Infusions: . sodium chloride       Flora Lipps, MD  [facility] 10/17/2019

## 2019-10-17 NOTE — Progress Notes (Signed)
  Speech Language Pathology Treatment: Dysphagia  Patient Details Name: Amanda [name] MRN: [id number] DOB: 09/17/1941 Today's Date: 10/17/2019 Time: [id number] SLP Time Calculation (min) (ACUTE ONLY): 20 min  Assessment / Plan / Recommendation Clinical Impression  SLP followed up for diet tolerance. Reviewed pre existing safe swallowing strategies from prior ST encounter and rationale  (including no straws, pt with straws at bedside) pt appeared agreeable. Pt reported some pain in posterior oral cavity, assessed. Palatal region with patchy white coating, persisted despite upper denture removal and oral care. Communicated with RN to notify MD for consideration of oral rinse per MD discretion for possible thrush. Pt with delayed nonproductive cough x1 following thin liquids and puree snack. Vocal quality remained clear. SLP to follow up.      HPI HPI: This is a 79 year old female with severe COPD and mild dementia admitted to the hospital this time. Patient did have recurrent 3rd or 4th spontaneous PTX in past year. Recently had Covid-19 09/18/2019) and son just died from it. Had chest tube placed in  ED 12/31 with improvement in dyspnea and PTX.  RT documented that pt reported SOB during eating/drinking.  No previously documented dysphagia hx.       SLP Plan  Continue with current plan of care       Recommendations  Diet recommendations: Dysphagia 3 (mechanical soft);Thin liquid Liquids provided via: Cup;No straw Medication Administration: Whole meds with puree Supervision: Patient able to self feed;Intermittent supervision to cue for compensatory strategies Compensations: Slow rate;Small sips/bites;Minimize environmental distractions Postural Changes and/or Swallow Maneuvers: Seated upright 90 degrees;Upright 30-60 min after meal                Oral Care Recommendations: Oral care BID Follow up Recommendations: 24 hour supervision/assistance SLP Visit Diagnosis: Dysphagia,  unspecified (R13.10) Plan: Continue with current plan of care       [place] MA, CCC-SLP Acute Rehabilitation Services  10/17/2019, 4:11 PM

## 2019-10-17 NOTE — Progress Notes (Signed)
      [address]       [place] [id number]             [phone]       5 Days Post-Op Procedure(s) (LRB): VIDEO BRONCHOSCOPY WITH INSERTION OF INTERBRONCHIAL VALVES (IBV)  TIMES THREE RIGHT UPPER LOBE , ONE MIDDLE LOBE. (N/A)  Subjective: Patient states breathing is "pretty good"  Objective: Vital signs in last 24 hours: Temp:  [97.4 F (36.3 C)-98.3 F (36.8 C)] 98.3 F (36.8 C) (01/18 0804) Pulse Rate:  [87-103] 89 (01/18 0804) Cardiac Rhythm: Sinus tachycardia (01/17 1905) Resp:  [18-27] 22 (01/18 0804) BP: (92-113)/(50-62) 104/51 (01/18 0804) SpO2:  [96 %-100 %] 99 % (01/18 0804) FiO2 (%):  [3 %] 3 % (01/17 1916)     Intake/Output from previous day: 01/17 0701 - 01/18 0700 In: -  Out: 1350 [Urine:1350]   Physical Exam:  Cardiovascular: RRR Pulmonary: Clear to auscultation on the left and coarse on the right Wounds: Dressing is clean and dry.   Chest Tube: to water seal, small air leak present  Lab Results: CBC: Recent Labs    10/15/19 0543 10/17/19 0207  WBC 11.0* 8.4  HGB 10.4* 10.3*  HCT 33.4* 32.6*  PLT 445* 407*   BMET:  Recent Labs    10/15/19 0543 10/17/19 0207  NA 141 140  K 4.4 4.0  CL 105 103  CO2 27 27  GLUCOSE 97 108*  BUN 6* 6*  CREATININE 0.69 0.82  CALCIUM 9.3 9.2    PT/INR: No results for input(s): LABPROT, INR in the last 72 hours. ABG:  INR: Will add last result for INR, ABG once components are confirmed Will add last 4 CBG results once components are confirmed  Assessment/Plan: 1. CV - SR 2.  Pulmonary - s/p IBV 01/13(3 in RUL, 1 in RML). On 2 liters of oxygen via [place]. Pigtail chest tube is to water seal and there is still a  small air leak. CXR this am appears stable. Check CXR in am. 3. Mental status-mild dementia history, per primary    Pellegrino Kennard M ZimmermanPA-C 10/17/2019,8:06 AM [phone]

## 2019-10-17 NOTE — Progress Notes (Signed)
Physical Therapy Treatment Patient Details Name: Amanda [name] MRN: [id number] DOB: Feb 24, 1941 Today's Date: 10/17/2019    History of Present Illness Pt is a 79 y.o. female with recent COVID(+) last month, admitted 09/29/19 with worsening SOB. Pt with R-side pneumothorax s/p chest tube placement. Pt is now s/p placement of intrabrachial valves x4 1/13.  Also with AMS; mild dementia history per primary. PMH includes multiple spontaneous pneumothorax, COPD, bladder CA s/p cystectomy with uretoroileal conduit. Of note, son recently passed 09/24/19 from [place].   PT Comments    Pt slowly progressing with mobility. Pt remains limited by generalized weakness and decreased activity tolerance, also some self-limiting behavior due to SOB and fearful of falling. SpO2 down to 86% on RA with ambulation, maintaining >/95% on 3L O2 [place] with activity. Will continue to follow acutely.    Follow Up Recommendations  Home health PT;Supervision/Assistance - 24 hour     Equipment Recommendations  Wheelchair (measurements PT);Wheelchair cushion (measurements PT)    Recommendations for Other Services       Precautions / Restrictions Precautions Precautions: Fall;Other (comment) Precaution Comments: R chest tube; urostomy (chronic) Restrictions Weight Bearing Restrictions: No    Mobility  Bed Mobility Overal bed mobility: Modified Independent Bed Mobility: Supine to Sit           General bed mobility comments: no assist needed, pt manages lines  Transfers Overall transfer level: Needs assistance Equipment used: Rolling walker (2 wheeled);None Transfers: Sit to/from Stand Sit to Stand: Supervision         General transfer comment: Able to stand from bed and chair multiple times, supervision for safety, good technique; intermittent cues to focus on task instead of managing lines  Ambulation/Gait Ambulation/Gait assistance: Min guard Gait Distance (Feet): 28 Feet Assistive device: Rolling  walker (2 wheeled) Gait Pattern/deviations: Step-through pattern;Decreased stride length   Gait velocity interpretation: 1.31 - 2.62 ft/sec, indicative of limited community ambulator General Gait Details: Rush ambulation in room with RW and min guard for balance, pt stating, "If I can make it that far... I can't do that anymore... I just can't ok." Moving well although limited by fatigue requiring seated rest break. SpO2 down to 86% on RA, returning to 96-99% on 3L O2. Pt declining additional ambulation distance   Stairs             Wheelchair Mobility    Modified Rankin (Stroke Patients Only)       Balance Overall balance assessment: Needs assistance Sitting-balance support: Feet supported;No upper extremity supported Sitting balance-Leahy Scale: Fair     Standing balance support: Bilateral upper extremity supported Standing balance-Leahy Scale: Fair Standing balance comment: Can static stand without UE support                            Cognition Arousal/Alertness: Awake/alert Behavior During Therapy: WFL for tasks assessed/performed;Anxious Overall Cognitive Status: History of cognitive impairments - at baseline Area of Impairment: Orientation;Attention;Memory;Following commands;Awareness;Problem solving;Safety/judgement                   Current Attention Level: Sustained;Selective Memory: Decreased short-term memory Following Commands: Follows one step commands consistently;Follows one step commands with increased time Safety/Judgement: Decreased awareness of safety;Decreased awareness of deficits Awareness: Intellectual Problem Solving: Slow processing;Requires verbal cues General Comments: Easily distracted by lines despite attempts to have pt focus attention elsewhere. Seems anxious with mobility stating, "I can't, I'll fall... but I can't stand for two minutes straight.Marland KitchenMarland Kitchen"  Pt moving well but difficult to progress mobility due to this       Exercises Other Exercises Other Exercises: 3x static standing for 1-min trials - pt anxious with this requiring max encouragement to stay standing that long (unwilling to try longer) and to attempt repeated trials; 2-3 min seated rest between trials; SpO2 96% on 3L O2 Other Exercises: Pt independently performing LAQ and seated marching stating, "Can't I do this instead (of standing/walking)?" - attempted educ on strength vs endurance    General Comments        Pertinent Vitals/Pain Pain Assessment: Faces Faces Pain Scale: Hurts a little bit Pain Location: Chest tube insertion Pain Descriptors / Indicators: Sore Pain Intervention(s): Monitored during session    Home Living                      Prior Function            PT Goals (current goals can now be found in the care plan section) Acute Rehab PT Goals Patient Stated Goal: get stronger PT Goal Formulation: With patient Time For Goal Achievement: 10/31/19 Potential to Achieve Goals: Good Progress towards PT goals: Progressing toward goals(slow, some self-limiting)    Frequency    Min 3X/week      PT Plan Current plan remains appropriate    Co-evaluation              AM-PAC PT "6 Clicks" Mobility   Outcome Measure  Help needed turning from your back to your side while in a flat bed without using bedrails?: None Help needed moving from lying on your back to sitting on the side of a flat bed without using bedrails?: None Help needed moving to and from a bed to a chair (including a wheelchair)?: A Little Help needed standing up from a chair using your arms (e.g., wheelchair or bedside chair)?: A Little Help needed to walk in hospital room?: A Little Help needed climbing 3-5 steps with a railing? : A Lot 6 Click Score: 19    End of Session Equipment Utilized During Treatment: Oxygen Activity Tolerance: Patient tolerated treatment well;Patient limited by fatigue Patient left: in chair;with call  bell/phone within reach Nurse Communication: Mobility status PT Visit Diagnosis: Muscle weakness (generalized) (M62.81);Difficulty in walking, not elsewhere classified (R26.2)     Time: [id number] PT Time Calculation (min) (ACUTE ONLY): 27 min  Charges:  $Therapeutic Exercise: 23-37 mins                    Mabeline Caras, PT, DPT Acute Rehabilitation Services  Pager [phone] Office [phone]  Derry Lory 10/17/2019, 12:37 PM

## 2019-10-18 ENCOUNTER — Inpatient Hospital Stay (HOSPITAL_COMMUNITY): Payer: Medicare Other

## 2019-10-18 NOTE — Plan of Care (Signed)

## 2019-10-18 NOTE — Progress Notes (Addendum)
6 Days Post-Op Procedure(s) (LRB): VIDEO BRONCHOSCOPY WITH INSERTION OF INTERBRONCHIAL VALVES (IBV)  TIMES THREE RIGHT UPPER LOBE , ONE MIDDLE LOBE. (N/A) Subjective: Awake and alert. Denies any changes in her breathing status.   Objective: Vital signs in last 24 hours: Temp:  [97.5 F (36.4 C)-98.3 F (36.8 C)] 98.3 F (36.8 C) (01/19 0725) Pulse Rate:  [79-99] 79 (01/19 0725) Cardiac Rhythm: Normal sinus rhythm (01/19 0700) Resp:  [24-29] 25 (01/19 0725) BP: (91-113)/(49-78) 113/61 (01/19 0725) SpO2:  [94 %-100 %] 100 % (01/19 0916)  Hemodynamic parameters for last 24 hours:    Intake/Output from previous day: 01/18 0701 - 01/19 0700 In: 717 [P.O.:717] Out: 1200 [Urine:1200] Intake/Output this shift: No intake/output data recorded.  General appearance: alert, cooperative and no distress Neurologic: intact Heart: regular rate and rhythm Lungs: Breath sounds are clear, shallow. Active air leak from the rightr pleural pigtail catheter. No drainage. No change in PTX on CXR.  Wound: The pleural tube is well secured to the skin  and connections are also secure.   EXAM: PORTABLE CHEST 1 VIEW  COMPARISON:  10/17/2019.  FINDINGS: Right chest tube in stable position. Right pneumothorax unchanged from prior exam. COPD. Endobronchial valves again noted on the right in stable position. Prominent interstitial changes are again noted without interim change. Stable bilateral pleural thickening. Heart size normal. No acute bony abnormality.  IMPRESSION: 1. Right chest tube in stable position. Right pneumothorax unchanged from prior exam.  2. COPD. Stable chronic interstitial changes and pleural thickening.   Electronically Signed   By: Marcello Moores  Register   On: 10/18/2019 08:58 Lab Results: Recent Labs    10/17/19 0207  WBC 8.4  HGB 10.3*  HCT 32.6*  PLT 407*   BMET:  Recent Labs    10/17/19 0207  NA 140  K 4.0  CL 103  CO2 27  GLUCOSE 108*  BUN 6*   CREATININE 0.82  CALCIUM 9.2    PT/INR: No results for input(s): LABPROT, INR in the last 72 hours. ABG No results found for: PHART, HCO3, TCO2, ACIDBASEDEF, O2SAT CBG (last 3)  No results for input(s): GLUCAP in the last 72 hours.  Assessment/Plan: S/P Procedure(s) (LRB): VIDEO BRONCHOSCOPY WITH INSERTION OF INTERBRONCHIAL VALVES (IBV)  TIMES THREE RIGHT UPPER LOBE , ONE MIDDLE LOBE. (N/A)  -Recurrent right PTX in the setting of severe COPD. POD- 6 bronchoscopy and placement of multiple right interbronchial valves. Still has significant air leak but PTX is stable on water seal. Will place the tube to a Mini [place].  Considering discharge to home with the pleural pigtail catheter in place. She describes good family support at home, will confirm with her daughter. Will also arrange for home health nursing to monitor function of the pleural drainage system.     LOS: 19 days    Antony Odea, [place] [phone] 10/18/2019 Patient seen and examined, agree with above Her air leak is essentially unchanged over the past few days. Valves improved air leak but limited benefit likely due to incomplete fissures and cross ventilation. She is begging to go home. Likely the only way to get leak to stop is to do surgery and staple the affected areas. She is not open to that idea currently.  Revonda Standard Roxan Hockey, MD [facility] [phone]

## 2019-10-18 NOTE — Evaluation (Signed)
Occupational Therapy Evaluation Patient Details Name: Amanda [name] MRN: [id number] DOB: 1941-08-05 Today's Date: 10/18/2019    History of Present Illness Pt is a 79 y.o. female with recent COVID(+) last month, admitted 09/29/19 with worsening SOB. Pt with R-side pneumothorax s/p chest tube placement. Pt is now s/p placement of intrabrachial valves x4 1/13.  Also with AMS; mild dementia history per primary. PMH includes multiple spontaneous pneumothorax, COPD, bladder CA s/p cystectomy with uretoroileal conduit. Of note, son recently passed 09/24/19 from [place].   Clinical Impression   Patient presenting with decreased I in self care, balance, functional mobility/transfers, safety awareness, and strength. Patient reports being mod I PTA. Patient currently functioning at min guard - min A overall with self care tasks and functional transfers. Patient will benefit from acute OT to increase overall independence in the areas of ADLs, functional mobility, and safety awareness in order to safely discharge home with caregiver.    Follow Up Recommendations  Home health OT;Supervision/Assistance - 24 hour    Equipment Recommendations  3 in 1 bedside commode    Recommendations for Other Services       Precautions / Restrictions Precautions Precautions: Fall;Other (comment) Precaution Comments: R chest tube; urostomy (chronic) Restrictions Weight Bearing Restrictions: No      Mobility Bed Mobility Overal bed mobility: Needs Assistance Bed Mobility: Supine to Sit     Supine to sit: Supervision     General bed mobility comments: Pt needing assistance to manage lines and min cuing for [place] placement  Transfers Overall transfer level: Needs assistance Equipment used: 1 person [place] held assist Transfers: Sit to/from Stand Sit to Stand: Min guard Stand pivot transfers: Min guard            Balance Overall balance assessment: Needs assistance Sitting-balance support: Feet  supported;No upper extremity supported Sitting balance-Leahy Scale: Fair     Standing balance support: Bilateral upper extremity supported Standing balance-Leahy Scale: Fair Standing balance comment: Can static stand without UE support                           ADL either performed or assessed with clinical judgement   ADL Overall ADL's : Needs assistance/impaired Eating/Feeding: Modified independent;Sitting   Grooming: Wash/dry hands;Wash/dry face;Oral care;Brushing hair;Set up;Sitting   Upper Body Bathing: Minimal assistance;Sitting Upper Body Bathing Details (indicate cue type and reason): chest tube Lower Body Bathing: Minimal assistance;Sit to/from stand   Upper Body Dressing : Minimal assistance;Sitting   Lower Body Dressing: Minimal assistance;Sit to/from stand   Toilet Transfer: Minimal assistance;RW   Toileting- Clothing Manipulation and Hygiene: Minimal assistance;Sit to/from stand       Functional mobility during ADLs: Minimal assistance;Rolling walker General ADL Comments: min A overall with RW. Pt needing encouragement for participation as she is very self limiting and anxious about mobility and "lines"     Vision Baseline Vision/History: Wears glasses Wears Glasses: Reading only Patient Visual Report: No change from baseline              Pertinent Vitals/Pain Pain Assessment: Faces Faces Pain Scale: Hurts a little bit Pain Location: generalized Pain Descriptors / Indicators: Sore Pain Intervention(s): Monitored during session;Repositioned     Gionfriddo Dominance Right   Extremity/Trunk Assessment Upper Extremity Assessment Upper Extremity Assessment: Generalized weakness   Lower Extremity Assessment Lower Extremity Assessment: Generalized weakness   Cervical / Trunk Assessment Cervical / Trunk Assessment: Lordotic   Communication Communication Communication: HOH   Cognition  Arousal/Alertness: Awake/alert Behavior During Therapy:  WFL for tasks assessed/performed;Anxious Overall Cognitive Status: History of cognitive impairments - at baseline Area of Impairment: Orientation;Attention;Memory;Following commands;Awareness;Problem solving;Safety/judgement      Orientation Level: Disoriented to;Time;Situation Current Attention Level: Sustained;Selective Memory: Decreased short-term memory Following Commands: Follows one step commands consistently;Follows one step commands with increased time Safety/Judgement: Decreased awareness of safety;Decreased awareness of deficits Awareness: Intellectual Problem Solving: Slow processing;Requires verbal cues General Comments: maximal encouragment needed for mobility              Home Living Family/patient expects to be discharged to:: Private residence Living Arrangements: Alone(was living with son but he passed away from covid Sep 25, 2023) Available Help at Discharge: Family;Available 24 hours/day Type of Home: House Home Access: Level entry     Home Layout: One level     Bathroom Shower/Tub: Teacher, early years/pre: Standard     Home Equipment: Bedside commode   Additional Comments: pt reports she can stay with her granddaughter and have 24/7 assist. Pt did live with son but is now alone as pt's on passed away on [id number] from covid complications      Prior Functioning/Environment Level of Independence: Independent with assistive device(s)        Comments: pt uses rollator, recently daughter has been helping with showering and ADLs due to SOB        OT Problem List: Decreased strength;Pain;Decreased cognition;Decreased activity tolerance;Decreased safety awareness;Impaired balance (sitting and/or standing)      OT Treatment/Interventions: Self-care/ADL training;Therapeutic exercise;Therapeutic activities;Energy conservation;DME and/or AE instruction;Patient/family education;Balance training    OT Goals(Current goals can be found in the care plan  section) Acute Rehab OT Goals Patient Stated Goal: get stronger OT Goal Formulation: With patient Time For Goal Achievement: 11/01/19 Potential to Achieve Goals: Good ADL Goals Pt Will Perform Upper Body Bathing: with modified independence Pt Will Perform Lower Body Bathing: with modified independence Pt Will Perform Upper Body Dressing: with modified independence Pt Will Perform Lower Body Dressing: with modified independence Pt Will Transfer to Toilet: with modified independence Pt Will Perform Toileting - Clothing Manipulation and hygiene: with modified independence Pt Will Perform Tub/Shower Transfer: with supervision  OT Frequency: Min 2X/week   Barriers to D/C: Other (comment)(none known at this time)             AM-PAC OT "6 Clicks" Daily Activity     Outcome Measure Help from another person eating meals?: None Help from another person taking care of personal grooming?: A Little Help from another person toileting, which includes using toliet, bedpan, or urinal?: A Little Help from another person bathing (including washing, rinsing, drying)?: A Little Help from another person to put on and taking off regular upper body clothing?: A Little Help from another person to put on and taking off regular lower body clothing?: A Little 6 Click Score: 19   End of Session Equipment Utilized During Treatment: Rolling walker Nurse Communication: Mobility status;Precautions  Activity Tolerance: Patient tolerated treatment well Patient left: in chair;with call bell/phone within reach  OT Visit Diagnosis: Muscle weakness (generalized) (M62.81);Pain                Time: [id number] OT Time Calculation (min): 12 min Charges:  OT General Charges $OT Visit: 1 Visit   Gypsy Decant MS, OTR/L 10/18/2019, 9:58 AM

## 2019-10-18 NOTE — Progress Notes (Signed)
PT Cancellation Note  Patient Details Name: Amanda [name] MRN: [id number] DOB: Jul 17, 1941   Cancelled Treatment:    Reason Eval/Treat Not Completed: Fatigue/lethargy limiting ability to participate; attempted to see pt, she had been up already in chair and reports walked to the door.  Reports too fatigued to participate in PT at this time.  Hopeful to go home tomorrow.  Will attempt again in am.    Reginia Naas 10/18/2019, 5:25 PM  Magda Kiel, [place] [phone] 10/18/2019

## 2019-10-18 NOTE — Progress Notes (Signed)
PROGRESS NOTE  Amanda [name] [id number] DOB: 04-13-1941 DOA: 09/29/2019 PCP: Lemmie Evens, MD   LOS: 19 days   Brief narrative: As per HPI,  This is a 79 year old female with severe COPD and mild dementia admitted to the hospital this time. Patient did have recurrent 3rd or 4th spontaneous PTX in past year. Recently had Covid-19 2019-08-27) and son just died from it. Had chest tube placed in  ED 12/31 with improvement in dyspnea and PTX. Patient was seen by cardiothoracic surgery.  1/6-chest tube to waterseal-repeat chest x-ray showing worsening pneumothorax chest tube placed back to suction 1/9-chest tube was dislodged but patient not in distress toTCTS calledIR to place PIG tailcatheterwhich was done 1/9. 1/10: Suction increased. 1/12:  Status post IBV valve  Assessment/Plan:  Principal Problem:   Recurrent spontaneous pneumothorax Active Problems:   SOB (shortness of breath)   Spontaneous pneumothorax   Dementia (HCC)   Protein-calorie malnutrition, severe   Emphysema/COPD (HCC)   Hypokalemia  Recurrent spontaneous pneumothorax with persistent air leak Status post  video bronchoscopy with 3 right upper lobe segmental bronchi and one right middle lobe bronchus valve placement on 10/12/19.  Chest x-ray from 10/18/2019 shows a right apical pneumothorax which is similar to size from 10/17/2019.  Will continue to follow CT surgery recommendations regarding persistent air leak and further management.    Mild dementia with sundowning,confusion.  Stable. On low dose seroquel. Urinalysis was negative.  TSH, ammonia and B12 within normal limits.  History of chronic benzos and scheduled oxycodone.  Currently on tramadol/percocet  Severe emphysema/COPD/chronic hypoxic respiratory failure on 3 L oxygen by nasal cannula at home. Continue Symbicort and albuterol.  Continue supplemental oxygen, currently on 3 L of oxygen saturating at 99%  Hyperkalemia resolved.    Chronic normocytic  anemia stable, monitor CBC closely. Latest hemoglobin of 10.3  Concern for dysphagia.  Seen by speech therapy and recommended dysphagia 3 diet.  History of COVID-19 PCR and antigen negative on admission  VTE Prophylaxis: Lovenox  Code Status: Partial code  Family Communication:  I spoke with [name], the patient's daughter on the phone and updated her about the clinical condition of the patient.  She stated that the CT surgeon had spoken to her this morning and updated her about the current situation.  Disposition Plan: Home with home health , Will need wheel chair on discharge.  Follow CT surgery recommendations on further care and disposition. Patient is with still with persistent air leak.   Consultants: Cardiothoracic surgery Interventional radiology  Procedures: 1/6-chest tube to waterseal-repeat chest x-ray showing worsening pneumothorax chest tube placed back to suction 1/9-chest tube was dislodged but patient not in distress toTCTS calledIR to place PIG tailcatheterwhich was done 1/9 PM 1/13-status post video bronchoscopy with placement of IBV  Antibiotics:  Anti-infectives (From admission, onward)   None     Subjective: Today, patient denies increasing shortness of breath or chest pain but has mild cough.  Anxious to go home.  Objective: Vitals:   10/17/19 2000 10/18/19 0000  BP: (!) 109/54 (!) 91/49  Pulse: 96 84  Resp: (!) 29 (!) 24  Temp: 98 F (36.7 C) (!) 97.5 F (36.4 C)  SpO2: 99% 99%    Intake/Output Summary (Last 24 hours) at 10/18/2019 0700 Last data filed at 10/18/2019 0353 Gross per 24 hour  Intake 717 ml  Output 1200 ml  Net -483 ml   Filed Weights   09/29/19 1401 09/30/19 0005 10/04/19 0303  Weight: 47.6 kg  47 kg 44.4 kg   Body mass index is 16.29 kg/m.   Physical Exam:  General: Thinly built, not in obvious distress, on nasal cannula 3 L/min, mildly anxious.  Awake and communicative. HENT: Normocephalic, pupils equally reacting  to light and accommodation.  No scleral pallor or icterus noted. Oral mucosa is moist.  Chest: Diminished breath sounds bilaterally. Chest wall with pleural pigtail catheter-to waterseal.  Coarse breath sounds on the right side CVS: S1 &S2 heard. No murmur.  Regular rate and rhythm. Abdomen: Soft, nontender, nondistended.  Bowel sounds are heard. Urostomy bag in place. extremities: No cyanosis, clubbing or edema.  Peripheral pulses are palpable. Psych: Alert, awake and oriented to place and person, normal mood CNS:  No cranial nerve deficits.  Power equal in all extremities.  No sensory deficits noted.  No cerebellar signs.   Skin: Warm and dry.  No rashes noted.   Data Review: I have personally reviewed the following laboratory data and studies,  CBC: Recent Labs  Lab 10/12/19 0354 10/13/19 0641 10/15/19 0543 10/17/19 0207  WBC 8.3 8.1 11.0* 8.4  HGB 10.1* 9.5* 10.4* 10.3*  HCT 31.2* 30.5* 33.4* 32.6*  MCV 93.4 95.9 95.7 94.8  PLT 499* 483* 445* [id number]*   Basic Metabolic Panel: Recent Labs  Lab 10/12/19 0354 10/13/19 0641 10/15/19 0543 10/17/19 0207  NA 140 143 141 140  K 4.0 4.3 4.4 4.0  CL 103 106 105 103  CO2 26 28 27 27   GLUCOSE 113* 107* 97 108*  BUN 13 10 6* 6*  CREATININE 0.73 0.92 0.69 0.82  CALCIUM 9.1 9.0 9.3 9.2  MG 2.0 1.8  --  2.0   Liver Function Tests: Recent Labs  Lab 10/17/19 0207  AST 16  ALT 14  ALKPHOS 60  BILITOT 0.5  PROT 6.1*  ALBUMIN 2.4*   No results for input(s): LIPASE, AMYLASE in the last 168 hours. No results for input(s): AMMONIA in the last 168 hours. Cardiac Enzymes: No results for input(s): CKTOTAL, CKMB, CKMBINDEX, TROPONINI in the last 168 hours. BNP (last 3 results) No results for input(s): BNP in the last 8760 hours.  ProBNP (last 3 results) No results for input(s): PROBNP in the last 8760 hours.  CBG: No results for input(s): GLUCAP in the last 168 hours. Recent Results (from the past 240 hour(s))  Surgical pcr  screen     Status: None   Collection Time: 10/10/19 12:00 AM   Specimen: Nasal Mucosa; Nasal Swab  Result Value Ref Range Status   MRSA, PCR NEGATIVE NEGATIVE Final   Staphylococcus aureus NEGATIVE NEGATIVE Final    Comment: (NOTE) The Xpert SA Assay (FDA approved for NASAL specimens in patients 86 years of age and older), is one component of a comprehensive surveillance program. It is not intended to diagnose infection nor to guide or monitor treatment. Performed at [facility], [place] [address], [place], [place] [address]      Studies: DG Chest Port 1 View  Result Date: 10/17/2019 CLINICAL DATA:  Chest tube in place. EXAM: PORTABLE CHEST 1 VIEW COMPARISON:  10/16/2019 FINDINGS: The cardiomediastinal silhouette is unchanged with normal heart size. A right chest tube is unchanged. Right-sided bronchial valves are again noted. A small right-sided pneumothorax has slightly enlarged. The lungs remain hyperinflated with chronic interstitial densities. Mild bibasilar opacities are unchanged and likely reflect atelectasis. A small right pleural effusion is unchanged. IMPRESSION: Slightly increased size of small right pneumothorax, otherwise unchanged appearance of the chest. Electronically Signed  By: Logan Bores M.D.   On: 10/17/2019 08:27   DG Chest Port 1 View  Result Date: 10/16/2019 CLINICAL DATA:  Pneumothorax. EXAM: PORTABLE CHEST 1 VIEW COMPARISON:  One-view chest x-ray 10/15/2019 FINDINGS: The heart size is normal. Atherosclerotic calcifications are present at the aortic arch. Small right apical pneumothorax is stable. Chest tube is stable in position. Small right pleural effusion is present. Bibasilar airspace opacities have not significantly changed. IMPRESSION: 1. Stable small right apical pneumothorax with chest tube in place. 2. Small right pleural effusion. 3. Bibasilar airspace disease, likely atelectasis. Electronically Signed   By: San Morelle M.D.   On: 10/16/2019  09:33    Scheduled Meds: . docusate sodium  100 mg Oral BID  . enoxaparin (LOVENOX) injection  40 mg Subcutaneous Q24H  . feeding supplement  1 Container Oral TID BM  . mouth rinse  15 mL Mouth Rinse BID  . mometasone-formoterol  2 puff Inhalation BID  . multivitamin with minerals  1 tablet Oral Daily  . QUEtiapine  25 mg Oral QPM  . senna  1 tablet Oral Daily  . sodium chloride flush  3 mL Intravenous Q12H    Continuous Infusions: . sodium chloride       Flora Lipps, MD  [facility] 10/18/2019

## 2019-10-19 ENCOUNTER — Inpatient Hospital Stay (HOSPITAL_COMMUNITY): Payer: Medicare Other

## 2019-10-19 MED ORDER — NYSTATIN 100000 UNIT/ML MT SUSP: 5.0000 mL | Freq: Four times a day (QID) | OROMUCOSAL | Status: DC

## 2019-10-19 MED ORDER — NYSTATIN 100000 UNIT/ML MT SUSP: 5.0000 mL | Freq: Four times a day (QID) | OROMUCOSAL | 0 refills | Status: AC

## 2019-10-19 MED ORDER — DOCUSATE SODIUM 100 MG PO CAPS: 100.0000 mg | ORAL_CAPSULE | Freq: Two times a day (BID) | ORAL | 0 refills | Status: AC

## 2019-10-19 MED ORDER — QUETIAPINE FUMARATE 25 MG PO TABS: 25.0000 mg | ORAL_TABLET | Freq: Every evening | ORAL | 0 refills | Status: DC

## 2019-10-19 NOTE — Care Management (Signed)
Hospital bed.   Length of Need Lifetime   The above medical condition requires: Patient requires the ability to reposition frequently   Head must be elevated greater than: 30 degrees   Bed type Semi-electric    Pt is not able to reposition life needed in a regular bed. She needs her head elevated d/t pulmonary issues and a regular bed will not accomadate this.

## 2019-10-19 NOTE — TOC Transition Note (Signed)
Transition of Care [facility]) - CM/SW Discharge Note   Patient Details  Name: Amanda [name] MRN: [id number] Date of Birth: [birth year]  Transition of Care [facility]) CM/SW Contact:  Pollie Friar, RN Phone Number: 10/19/2019, 2:19 PM   Clinical Narrative:    Pt is discharging to granddaughters home: [place] in [place]. She will have 24 hour supervision between her granddaughter and daughter.  Pt with orders for wheelchair and 3 in 1. [facility] has both DME but the daughter will need to pick them up. She is aware and agreeable.  Daughter requesting a hospital bed for home. CM was able to find a bed at [facility]. All needed information faxed and they are to deliver the bed tonight. Daughter is in agreement with the plan.  Pt set up with [facility] for [facility] services. CM verified they are familiar with [facility] [place] since this is what patient will be discharging home with. Cory with [facility] is aware of d/c home today.  Daughter to bring oxygen for transport home. Pt has oxygen set up at home 3L [place].  Clinical Nurse Specialist met with daughter and went over education for the [place] for home.  Daughter providing transport home.  Final next level of care: Home w Home Health Services Barriers to Discharge: No Barriers Identified   Patient Goals and CMS Choice   CMS Medicare.gov Compare Post Acute Care list provided to:: Patient Represenative (must comment) Choice offered to / list presented to : Adult Children  Discharge Placement                       Discharge Plan and Services   Discharge Planning Services: CM Consult Post Acute Care Choice: Home Health          DME Arranged: 3-N-1, Wheelchair manual, Hospital bed       Representative spoke with at [place]: [facility] medical for the bed/ [place] for the 3 in 1 and wheelchair [place] Arranged: RN, PT, OT [place] Agency: [place] Date [place]: 10/19/19   Representative spoke with at [place] ([place]) Interventions     Readmission Risk Interventions No flowsheet data found.

## 2019-10-19 NOTE — Progress Notes (Signed)
Patient was stable at discharge. I removed her IV. We reviewed the discharge education. Patient and daughter verbalized understanding and had no further questions. Patient left with belongings and chest tube supplies in [place]. Family had supplemental oxygen ready in car for transport home.

## 2019-10-19 NOTE — Progress Notes (Signed)
Physical Therapy Treatment Patient Details Name: Amanda [name] MRN: [id number] DOB: Sep 13, 1941 Today's Date: 10/19/2019    History of Present Illness Pt is a 79 y.o. female with recent COVID(+) last month, admitted 09/29/19 with worsening SOB. Pt with R-side pneumothorax s/p chest tube placement. Pt is now s/p placement of intrabrachial valves x4 1/13.  Also with AMS; mild dementia history per primary. PMH includes multiple spontaneous pneumothorax, COPD, bladder CA s/p cystectomy with uretoroileal conduit. Of note, son recently passed 09/24/19 from [place].    PT Comments    Pt remains very anxious regarding onset of SOB with all activity. Discussed the importance of mobility. Pt required max encouragement to ambulate. Educated on the importance of ambulating minimally distances to her bathroom and kitchen. Pt was able to amb 15'x2 with RW, SPO2 >94% on 3LO2 via [place]. Acute PT to cont to follow.    Follow Up Recommendations  Home health PT;Supervision/Assistance - 24 hour     Equipment Recommendations  Wheelchair (measurements PT);Wheelchair cushion (measurements PT)(recommended purchasing an adjustable bed)    Recommendations for Other Services       Precautions / Restrictions Precautions Precautions: Fall;Other (comment) Precaution Comments: R chest tube; urostomy (chronic) Restrictions Weight Bearing Restrictions: No    Mobility  Bed Mobility Overal bed mobility: Needs Assistance Bed Mobility: Supine to Sit     Supine to sit: Supervision     General bed mobility comments: HOB elevated, no physical assist required, increased time, upon sitting pt reports 'I just get so SOB" Pt SpO2 97% on 3Lo2 via [place]  Transfers Overall transfer level: Needs assistance Equipment used: Rolling walker (2 wheeled) Transfers: Sit to/from Stand Sit to Stand: Min guard         General transfer comment: verbal cues to push up from bed not pull up from walker, min guard to steady during  transition of hands  Ambulation/Gait Ambulation/Gait assistance: Min guard Gait Distance (Feet): 15 Feet(x2) Assistive device: Rolling walker (2 wheeled) Gait Pattern/deviations: Step-through pattern;Decreased stride length Gait velocity: decreased Gait velocity interpretation: 1.31 - 2.62 ft/sec, indicative of limited community ambulator General Gait Details: with max encouragement pt amb to hallway, sat in chair x 2 min and then amb back to chair   Stairs             Wheelchair Mobility    Modified Rankin (Stroke Patients Only)       Balance Overall balance assessment: Needs assistance Sitting-balance support: Feet supported;No upper extremity supported Sitting balance-Leahy Scale: Fair     Standing balance support: Bilateral upper extremity supported Standing balance-Leahy Scale: Fair Standing balance comment: dependent on UE support                            Cognition Arousal/Alertness: Awake/alert Behavior During Therapy: Anxious Overall Cognitive Status: Within Functional Limits for tasks assessed                                 General Comments: pt able to follow commands and disconnect her urostomy, pt reports "I'm going home today"      Exercises      General Comments General comments (skin integrity, edema, etc.): SpO2 >94% on 3LO2 via [place] t/o session      Pertinent Vitals/Pain Pain Assessment: No/denies pain    Home Living  Prior Function            PT Goals (current goals can now be found in the care plan section) Progress towards PT goals: Progressing toward goals    Frequency    Min 3X/week      PT Plan Current plan remains appropriate    Co-evaluation              AM-PAC PT "6 Clicks" Mobility   Outcome Measure  Help needed turning from your back to your side while in a flat bed without using bedrails?: None Help needed moving from lying on your back to sitting  on the side of a flat bed without using bedrails?: None Help needed moving to and from a bed to a chair (including a wheelchair)?: A Little Help needed standing up from a chair using your arms (e.g., wheelchair or bedside chair)?: A Little Help needed to walk in hospital room?: A Little Help needed climbing 3-5 steps with a railing? : A Lot 6 Click Score: 19    End of Session Equipment Utilized During Treatment: Oxygen Activity Tolerance: (limited by anxiety) Patient left: in chair;with call bell/phone within reach Nurse Communication: Mobility status PT Visit Diagnosis: Muscle weakness (generalized) (M62.81);Difficulty in walking, not elsewhere classified (R26.2)     Time: [id number] PT Time Calculation (min) (ACUTE ONLY): 23 min  Charges:  $Gait Training: 8-22 mins                     Kittie Plater, PT, DPT Acute Rehabilitation Services Pager #: [phone] Office #: [phone]    Berline Lopes 10/19/2019, 11:13 AM

## 2019-10-19 NOTE — Progress Notes (Signed)
  Speech Language Pathology Treatment: Dysphagia  Patient Details Name: Amanda [name] MRN: [id number] DOB: [birth year] Today's Date: 10/19/2019 Time: 4327-6147 SLP Time Calculation (min) (ACUTE ONLY): 14 min  Assessment / Plan / Recommendation Clinical Impression  Patient seen at bedside, daughter present. Patient planing to d/c home with daughter today. Patient seen with thin liquids (via cup sips) and soft solids (chopped fruit). Pt able to feed self, no overt s/s aspiration observed. Pt did demonstrate mildly prolonged mastication with solids, though this likely d/t missing dentition. Good oral clearance after the swallow. ST educated pt and daughter re: recommendations to continue soft solids diet at home, avoid nuts and food items that are too difficult to chew. Recommended patient only eat PO when sitting upright, taking small bites and sips as well as frequent oral care. Pt and daughter verbalized understanding. ST also provided education re: s/sx of dysphagia to watch out for and to seek out ST if s/sx dysphagia occur.  ST to sign off as goals met and patient appropriate for discharge.   Note: RN entered room to discuss chest tube drainage system. While RN present, patient asking about receiving an oral rinse for what she believes may be oral thrush. RN stated she would send a message to MD.    HPI HPI: This is a 79 year old female with severe COPD and mild dementia admitted to the hospital this time. Patient did have recurrent 3rd or 4th spontaneous PTX in past year. Recently had Covid-19 08-21-19) and son just died from it. Had chest tube placed in  ED 12/31 with improvement in dyspnea and PTX.  RT documented that pt reported SOB during eating/drinking.  No previously documented dysphagia hx.       SLP Plan  Discharge SLP treatment due to (comment)       Recommendations  Diet recommendations: Dysphagia 3 (mechanical soft);Thin liquid Liquids provided via: Cup Medication  Administration: Whole meds with puree Supervision: Patient able to self feed;Intermittent supervision to cue for compensatory strategies Compensations: Slow rate;Small sips/bites;Minimize environmental distractions Postural Changes and/or Swallow Maneuvers: Seated upright 90 degrees;Upright 30-60 min after meal                Oral Care Recommendations: Oral care QID Follow up Recommendations: 24 hour supervision/assistance SLP Visit Diagnosis: Dysphagia, unspecified (R13.10) Plan: Discharge SLP treatment due to (comment)       Ryan Park, M.Ed., [place] [phone]: Acute Rehab office [phone] - pager    Marina Goodell 10/19/2019, 3:33 PM

## 2019-10-19 NOTE — Progress Notes (Signed)
      [address]       [place] [address]             [phone]       7 Days Post-Op Procedure(s) (LRB): VIDEO BRONCHOSCOPY WITH INSERTION OF INTERBRONCHIAL VALVES (IBV)  TIMES THREE RIGHT UPPER LOBE , ONE MIDDLE LOBE. (N/A)  Subjective: Patient without specific complaints this am. She hopes to go home.  Objective: Vital signs in last 24 hours: Temp:  [98.2 F (36.8 C)-98.8 F (37.1 C)] 98.5 F (36.9 C) (01/20 0600) Pulse Rate:  [94-105] 96 (01/19 1929) Cardiac Rhythm: Sinus tachycardia (01/20 0600) Resp:  [19-21] 21 (01/19 1929) BP: (106-111)/(52-60) 111/52 (01/19 1929) SpO2:  [97 %-100 %] 98 % (01/19 1929) Weight:  [48 kg] 48 kg (01/20 0440)     Intake/Output from previous day: 01/19 0701 - 01/20 0700 In: 0  Out: 1025 [Urine:1025]   Physical Exam:  Cardiovascular: RRR Pulmonary: Clear to auscultation on the left and coarse on the right Wounds: Dressing is clean and dry.   Chest Tube: to mini express, persistent air leak  Lab Results: CBC: Recent Labs    10/17/19 0207  WBC 8.4  HGB 10.3*  HCT 32.6*  PLT 407*   BMET:  Recent Labs    10/17/19 0207  NA 140  K 4.0  CL 103  CO2 27  GLUCOSE 108*  BUN 6*  CREATININE 0.82  CALCIUM 9.2    PT/INR: No results for input(s): LABPROT, INR in the last 72 hours. ABG:  INR: Will add last result for INR, ABG once components are confirmed Will add last 4 CBG results once components are confirmed  Assessment/Plan: 1. CV - SR 2.  Pulmonary - s/p IBV 01/13(3 in RUL, 1 in RML). On 3 liters of oxygen via [place]. Pigtail chest tube is to mini express with persistent air leak. CXR this am appears stable (right pneumothorax). 3. Mental status-mild dementia history, per primary 4. Follow up appointment has been arranged as patient will be discharged with mini express. Patient and daughter made aware that mini express must remain upright at all times and that fluid has to remain in the chambers (system  is a similar to a regular Pleura Vac in those ways).   Lenox Bink M ZimmermanPA-C 10/19/2019,7:29 AM [phone]

## 2019-10-19 NOTE — Progress Notes (Signed)
Spent some time with Amanda [name] this morning talking about her portable chest tube drainage system that she will be discharged with.  Of note: patient will be going home on a [place] not an Express Mini 500. Observed that patient still has a level 2 air leak with intermittent bubbling.  Showed patient how to operate chest tube stand, how to use carrying case for drainage system. Emphasized keeping drainage system below chest at all times and keeping drainage system level so it does not tip over.  Plan is to provide education to daughter when she arrives this afternoon and answer any questions.

## 2019-10-19 NOTE — Discharge Summary (Signed)
Physician Discharge Summary  Amanda [name] [id number] DOB: Feb 25, 1941 DOA: 09/29/2019  PCP: Lemmie Evens, MD  Admit date: 09/29/2019 Discharge date: 10/19/2019  Admitted From: home Discharge disposition: home with 24 hour care   Recommendations for Outpatient Follow-Up:   1. Home health 2. DME 3. Per CT surgery: Follow up appointment has been arranged as patient will be discharged with mini express. Patient and daughter made aware that mini express must remain upright at all times and that fluid has to remain in the chambers (system is a similar to a regular Pleura Vac in those ways)   Discharge Diagnosis:   Principal Problem:   Recurrent spontaneous pneumothorax Active Problems:   SOB (shortness of breath)   Spontaneous pneumothorax   Dementia (HCC)   Protein-calorie malnutrition, severe   Emphysema/COPD (HCC)   Hypokalemia    Discharge Condition: Improved.  Diet recommendation: Low sodium, heart healthy.  Carbohydrate-modified.  Regular.  Wound care: None.  Code status: Full.   History of Present Illness:   Amanda [name] is a 79 y.o. female with medical history significant of known history of multiple spontaneous pneumothorax on the right, she has had a prior history of COPD as well as a history of bladder cancer. She presents to the hospital with shortness of breath which became much worse today, she has been somewhat short of breath since being diagnosed with coronavirus 1 month ago. She had an admission to an outside hospital 1 month ago and had a spontaneous pneumothorax that was treated with a chest tube at that time. She cannot recall the exact piece of hardware that was inside however she successfully was discharged from the hospital but comes back today with increasing shortness of breath. She has no chest pain, no fever, occasional cough. She reports that her baseline COPD is quite severe and she uses daily inhalational therapy. She has not  smoke cigarettes in the last 3 years. She denies have a pulmonologist. She has seen Dr. Roxan Hockey previously for H. Rivera Colon by Problem:   Recurrent spontaneous pneumothorax with persistent air leak Status post  video bronchoscopy with 3 right upper lobe segmental bronchi and one right middle lobe bronchus valve placement on 10/12/19.  Chest x-ray from 10/18/2019 shows a right apical pneumothorax which is similar to size from 10/17/2019.    CT surgery recommendations: Her air leak is essentially unchanged over the past few days. Valves improved air leak but limited benefit likely due to incomplete fissures and cross ventilation. She is begging to go home. Likely the only way to get leak to stop is to do surgery and staple the affected areas. She is not open to that idea currently.   Mild dementia with sundowning,confusion.  Stable. On low dose seroquel. Urinalysis was negative.  TSH, ammonia and B12 within normal limits.  History of chronic benzos and scheduled oxycodone.  Currently on tramadol/percocet  Severe emphysema/COPD/chronic hypoxic respiratory failure on 3 L oxygen by nasal cannula at home. Continue Symbicort and albuterol.  Continue supplemental oxygen, currently on 3 L of oxygen saturating at 99%  Hyperkalemiaresolved.    Chronic normocytic anemiastable, monitor CBC closely. Latest hemoglobin of 10.3     Medical Consultants:    CT surgery IR   Discharge Exam:   Vitals:   10/19/19 0731 10/19/19 0935  BP: (!) 93/55   Pulse: 95   Resp: (!) 28   Temp: 98.4 F (36.9 C)   SpO2: 98% 99%  Vitals:   10/19/19 0440 10/19/19 0600 10/19/19 0731 10/19/19 0935  BP:   (!) 93/55   Pulse:   95   Resp:   (!) 28   Temp:  98.5 F (36.9 C) 98.4 F (36.9 C)   TempSrc:  Oral Oral   SpO2:   98% 99%  Weight: 48 kg     Height:        General exam: Appears calm and comfortable.  The results of significant diagnostics from this hospitalization (including  imaging, microbiology, ancillary and laboratory) are listed below for reference.     Procedures and Diagnostic Studies:   Portable chest 1 View  Result Date: 09/30/2019 CLINICAL DATA:  Recurrent spontaneous pneumothorax with chest tube in place. EXAM: PORTABLE CHEST 1 VIEW COMPARISON:  09/29/2019 FINDINGS: Right chest tube in place. a small residual right-sided pneumothorax is identified. This appears increased in volume compared with 09/29/2019 at 3:55 p.m. Hyperinflation with chronic interstitial changes noted consistent with advanced COPD/emphysema. Diffuse interstitial thickening is noted bilaterally, unchanged. IMPRESSION: 1. Small residual right-sided pneumothorax. This appears increased in volume compared with 09/29/19 at 3:55 p.m. 2. Advanced changes of COPD/emphysema with superimposed increase interstitial markings bilaterally. Electronically Signed   By: Kerby Moors M.D.   On: 09/30/2019 09:44   DG Chest Portable 1 View  Result Date: 09/29/2019 CLINICAL DATA:  COPD. Tube placement. Recent COVID-19 with collapse lung. EXAM: PORTABLE CHEST 1 VIEW COMPARISON:  Earlier today at 2:20 p.m. FINDINGS: 3:55 p.m. Placement of a right-sided chest tube. Midline trachea. Normal heart size. Atherosclerosis in the transverse aorta. Numerous leads and wires project over the chest. Probable trace right pleural fluid. No residual pneumothorax. Diffuse interstitial thickening. Similar bibasilar airspace disease. Left apical pleuroparenchymal scarring. IMPRESSION: Re-expansion of the right lung after chest tube placement. Diffuse interstitial thickening with similar bibasilar airspace disease, most likely atelectasis. Aortic Atherosclerosis (ICD10-I70.0). Electronically Signed   By: Abigail Miyamoto M.D.   On: 09/29/2019 16:07   DG Chest Portable 1 View  Result Date: 09/29/2019 CLINICAL DATA:  History of COVID on Thanksgiving. COPD. Wheezing in the right lung. EXAM: PORTABLE CHEST 1 VIEW COMPARISON:  Chest  radiograph 08/30/2019 FINDINGS: Stable cardiomediastinal contours. There is a large right pneumothorax with compressive atelectasis of the right lung. Diffuse bilateral coarse interstitial markings. Lungs are hyperinflated. Biapical pleuroparenchymal scarring. No acute findings in the visualized skeleton. IMPRESSION: 1. Large right pneumothorax with compressive atelectasis of the right lung. Probable fracture in a right lower anterolateral rib. 2. Chronic changes consistent with COPD and chronic bronchitis. These results were called by telephone at the time of interpretation on 09/29/2019 at 2:44 pm to provider Fredia Sorrow , who verbally acknowledged these results. Electronically Signed   By: Audie Pinto M.D.   On: 09/29/2019 14:47     Labs:   Basic Metabolic Panel: Recent Labs  Lab 10/13/19 0641 10/13/19 0641 10/15/19 0543 10/17/19 0207  NA 143  --  141 140  K 4.3   < > 4.4 4.0  CL 106  --  105 103  CO2 28  --  27 27  GLUCOSE 107*  --  97 108*  BUN 10  --  6* 6*  CREATININE 0.92  --  0.69 0.82  CALCIUM 9.0  --  9.3 9.2  MG 1.8  --   --  2.0   < > = values in this interval not displayed.   GFR Estimated Creatinine Clearance: 42.8 mL/min (by C-G formula based on SCr of 0.82  mg/dL). Liver Function Tests: Recent Labs  Lab 10/17/19 0207  AST 16  ALT 14  ALKPHOS 60  BILITOT 0.5  PROT 6.1*  ALBUMIN 2.4*   No results for input(s): LIPASE, AMYLASE in the last 168 hours. No results for input(s): AMMONIA in the last 168 hours. Coagulation profile No results for input(s): INR, PROTIME in the last 168 hours.  CBC: Recent Labs  Lab 10/13/19 0641 10/15/19 0543 10/17/19 0207  WBC 8.1 11.0* 8.4  HGB 9.5* 10.4* 10.3*  HCT 30.5* 33.4* 32.6*  MCV 95.9 95.7 94.8  PLT 483* 445* 407*   Cardiac Enzymes: No results for input(s): CKTOTAL, CKMB, CKMBINDEX, TROPONINI in the last 168 hours. BNP: Invalid input(s): POCBNP CBG: No results for input(s): GLUCAP in the last 168  hours. D-Dimer No results for input(s): DDIMER in the last 72 hours. Hgb A1c No results for input(s): HGBA1C in the last 72 hours. Lipid Profile No results for input(s): CHOL, HDL, LDLCALC, TRIG, CHOLHDL, LDLDIRECT in the last 72 hours. Thyroid function studies No results for input(s): TSH, T4TOTAL, T3FREE, THYROIDAB in the last 72 hours.  Invalid input(s): FREET3 Anemia work up No results for input(s): VITAMINB12, FOLATE, FERRITIN, TIBC, IRON, RETICCTPCT in the last 72 hours. Microbiology Recent Results (from the past 240 hour(s))  Surgical pcr screen     Status: None   Collection Time: 10/10/19 12:00 AM   Specimen: Nasal Mucosa; Nasal Swab  Result Value Ref Range Status   MRSA, PCR NEGATIVE NEGATIVE Final   Staphylococcus aureus NEGATIVE NEGATIVE Final    Comment: (NOTE) The Xpert SA Assay (FDA approved for NASAL specimens in patients 20 years of age and older), is one component of a comprehensive surveillance program. It is not intended to diagnose infection nor to guide or monitor treatment. Performed at [facility], [address], [place], [place] [address]      Discharge Instructions:   Discharge Instructions    Discharge instructions   Complete by: As directed    Home health dys 3 diet   Increase activity slowly   Complete by: As directed      Allergies as of 10/19/2019      Reactions   Ciprofloxacin Other (See Comments)   GI pain, headache. Pt states she had while she was in hospital (IV) and was ok    Lactose Other (See Comments)   Intolerant per patient Intolerant per patient   Levofloxacin    Sulfa Antibiotics    Sulfamethoxazole Other (See Comments)   Other reaction(s): Dizziness (intolerance)   Aspirin Nausea And Vomiting   Cephalexin Rash   Clindamycin Rash   Erythromycin Base Rash   Hydromorphone Other (See Comments), Rash   Rash on back, redness Rash on back, redness   Penicillins Rash      Medication List    STOP taking these  medications   ALPRAZolam 0.5 MG tablet Commonly known as: XANAX     TAKE these medications   acetaminophen 500 MG tablet Commonly known as: TYLENOL Take 500-1,000 mg by mouth daily as needed for mild pain or moderate pain.   albuterol 108 (90 Base) MCG/ACT inhaler Commonly known as: VENTOLIN HFA Inhale 1-2 puffs into the lungs every 6 (six) hours as needed for wheezing or shortness of breath.   docusate sodium 100 MG capsule Commonly known as: COLACE Take 1 capsule (100 mg total) by mouth 2 (two) times daily.   multivitamin capsule Take 1 capsule by mouth daily.   oxyCODONE-acetaminophen 5-325  MG tablet Commonly known as: PERCOCET/ROXICET Take 1 tablet by mouth 3 (three) times daily.   QUEtiapine 25 MG tablet Commonly known as: SEROQUEL Take 1 tablet (25 mg total) by mouth every evening.   Symbicort 80-4.5 MCG/ACT inhaler Generic drug: budesonide-formoterol Inhale 2 puffs into the lungs 2 (two) times daily.   vitamin C 250 MG tablet Commonly known as: ASCORBIC ACID Take 500 mg by mouth daily.   Vitamin D-1000 Max St 25 MCG (1000 UT) tablet Generic drug: Cholecalciferol Take 1,000 Units by mouth daily.            Durable Medical Equipment  (From admission, onward)         Start     Ordered   10/19/19 1046  For home use only DME lightweight manual wheelchair with seat cushion  Once    Comments: Patient suffers from weakness which impairs their ability to perform daily activities like dressing in the home.  A walker will not resolve  issue with performing activities of daily living. A wheelchair will allow patient to safely perform daily activities. Patient is not able to propel themselves in the home using a standard weight wheelchair due to general weakness. Patient can self propel in the lightweight wheelchair. Length of need Lifetime. Accessories: elevating leg rests (ELRs), wheel locks, extensions and anti-tippers.   10/19/19 1046         Follow-up  Information    [name], MD. Go on 10/25/2019.   Specialty: Cardiothoracic Surgery Why: PA/LAT CXR to be taken (at [place] which is in the same builidng as Dr. Leonarda Salon office) on 01/26 at 11:30 am;Appointment time is at 12:00 pm Contact information: [place] [address] [phone]            Time coordinating discharge: 35 min  Signed:  Geradine Girt DO  [facility] 10/19/2019, 10:48 AM

## 2019-10-19 NOTE — Discharge Instructions (Signed)
Patient's portable chest tube box must remain upright at all times Pneumothorax A pneumothorax is commonly called a collapsed lung. It is a condition in which air leaks from a lung and builds up between the thin layer of tissue that covers the lungs (visceral pleura) and the interior wall of the chest cavity (parietal pleura). The air gets trapped outside the lung, between the lung and the chest wall (pleural space). The air takes up space and prevents the lung from fully expanding. This condition sometimes occurs suddenly with no apparent cause. The buildup of air may be small or large. A small pneumothorax may go away on its own. A large pneumothorax will require treatment and hospitalization. What are the causes? This condition may be caused by:  Trauma and injury to the chest wall.  Surgery and other medical procedures.  A complication of an underlying lung problem, especially chronic obstructive pulmonary disease (COPD) or emphysema. Sometimes the cause of this condition is not known. What increases the risk? You are more likely to develop this condition if:  You have an underlying lung problem.  You smoke.  You are 4-71 years old, female, tall, and underweight.  You have a personal or family history of pneumothorax.  You have an eating disorder (anorexia nervosa). This condition can also happen quickly, even in people with no history of lung problems. What are the signs or symptoms? Sometimes a pneumothorax will have no symptoms. When symptoms are present, they can include:  Chest pain.  Shortness of breath.  Increased rate of breathing.  Bluish color to your lips or skin (cyanosis). How is this diagnosed? This condition may be diagnosed by:  A medical history and physical exam.  A chest X-ray, chest CT scan, or ultrasound. How is this treated? Treatment depends on how severe your condition is. The goal of treatment is to remove the extra air and allow your lung to  expand back to its normal size.  For a small pneumothorax: ? No treatment may be needed. ? Extra oxygen is sometimes used to make it go away more quickly.  For a large pneumothorax or a pneumothorax that is causing symptoms, a procedure is done to drain the air from your lungs. To do this, a health care provider may use: ? A needle with a syringe. This is used to suck air from a pleural space where no additional leakage is taking place. ? A chest tube. This is used to suck air where there is ongoing leakage into the pleural space. The chest tube may need to remain in place for several days until the air leak has healed.  In more severe cases, surgery may be needed to repair the damage that is causing the leak.  If you have multiple pneumothorax episodes or have an air leak that will not heal, a procedure called a pleurodesis may be done. A medicine is placed in the pleural space to irritate the tissues around the lung so that the lung will stick to the chest wall, seal any leaks, and stop any buildup of air in that space. If you have an underlying lung problem, severe symptoms, or a large pneumothorax you will usually need to stay in the hospital. Follow these instructions at home: Lifestyle  Do not use any products that contain nicotine or tobacco, such as cigarettes and e-cigarettes. These are major risk factors in pneumothorax. If you need help quitting, ask your health care provider.  Do not lift anything that is heavier than  10 lb (4.5 kg), or the limit that your health care provider tells you, until he or she says that it is safe.  Avoid activities that take a lot of effort (strenuous) for as long as told by your health care provider.  Return to your normal activities as told by your health care provider. Ask your health care provider what activities are safe for you.  Do not fly in an airplane or scuba dive until your health care provider says it is okay. General instructions  Take  over-the-counter and prescription medicines only as told by your health care provider.  If a cough or pain makes it difficult for you to sleep at night, try sleeping in a semi-upright position in a recliner or by using 2 or 3 pillows.  If you had a chest tube and it was removed, ask your health care provider when you can remove the bandage (dressing). While the dressing is in place, do not allow it to get wet.  Keep all follow-up visits as told by your health care provider. This is important. Contact a health care provider if:  You cough up thick mucus (sputum) that is yellow or green in color.  You were treated with a chest tube, and you have redness, increasing pain, or discharge at the site where it was placed. Get help right away if:  You have increasing chest pain or shortness of breath.  You have a cough that will not go away.  You begin coughing up blood.  You have pain that is getting worse or is not controlled with medicines.  The site where your chest tube was located opens up.  You feel air coming out of the site where the chest tube was placed.  You have a fever or persistent symptoms for more than 2-3 days.  You have a fever and your symptoms suddenly get worse. These symptoms may represent a serious problem that is an emergency. Do not wait to see if the symptoms will go away. Get medical help right away. Call your local emergency services (911 in the [place] Do not drive yourself to the hospital. Summary  A pneumothorax, commonly called a collapsed lung, is a condition in which air leaks from a lung and gets trapped between the lung and the chest wall (pleural space).  The buildup of air may be small or large. A small pneumothorax may go away on its own. A large pneumothorax will require treatment and hospitalization.  Treatment for this condition depends on how severe the pneumothorax is. The goal of treatment is to remove the extra air and allow the lung to expand  back to its normal size. This information is not intended to replace advice given to you by your health care provider. Make sure you discuss any questions you have with your health care provider. Document Revised: 08/28/2017 Document Reviewed: 08/24/2017 Elsevier Patient Education  2020 [employer].

## 2019-10-19 NOTE — Care Management (Signed)
Patient suffers from weakness which impairs their ability to perform daily activities like dressing in the home. A walker will not resolve  issue with performing activities of daily living. A wheelchair will allow patient to safely perform daily activities. Patient is not able to propel themselves in the home using a standard weight wheelchair due to general weakness. Patient can self propel in the lightweight wheelchair. Length of need Lifetime.  Accessories: elevating leg rests (ELRs), wheel locks, extensions and anti-tippers.

## 2019-10-19 NOTE — Progress Notes (Signed)
Spent some time with Amanda [name] and her daughter providing education on chest tube and drainage system. Showed daughter how to change the dressing on the chest tube, how to read the air leak, and to call [facility] office if air leak increases. Has follow up appt. With [facility] on Jan 26.

## 2019-10-19 NOTE — Progress Notes (Signed)
Nutrition Follow-up  DOCUMENTATION CODES:   Underweight  INTERVENTION:   -Boost Breeze po TID, each supplement provides 250 kcal and 9 grams of protein -Multivitamin with minerals daily  NUTRITION DIAGNOSIS:   Increased nutrient needs related to chronic illness(COPD, underweight) as evidenced by estimated needs.  Ongoing.  GOAL:   Patient will meet greater than or equal to 90% of their needs  Progressing.  MONITOR:   PO intake, Supplement acceptance, Labs, Skin  ASSESSMENT:   79 yo female admitted with recurrent spontaneous pneumothorax. Chest tube placed 12/31. PMH includes dementia, COPD, COVID-18 Aug 2019, bladder cancer, seizures.  1/13: s/p video bronchoscopy w/ insertion of interbronchial valve  **RD working remotely**  Patient consuming 75% of meals. Pt is drinking Boost Breeze as well.  Admission weight: 105 lbs. Current weight: 105 lbs.  I/Os: -7.6L since 1/6 UOP: 1025 ml x 24 hrs  Labs reviewed. Medications: Multivitamin with minerals daily  Diet Order:   Diet Order            DIET DYS 3 Room service appropriate? Yes with Assist; Fluid consistency: Thin  Diet effective now              EDUCATION NEEDS:   Not appropriate for education at this time  Skin:  Skin Assessment: Reviewed RN Assessment(R chest tube)  Last BM:  1/19 -type 5  Height:   Ht Readings from Last 1 Encounters:  09/30/19 5\' 5"  (1.651 m)    Weight:   Wt Readings from Last 1 Encounters:  10/19/19 48 kg    Ideal Body Weight:  56.8 kg  BMI:  Body mass index is 17.61 kg/m.  Estimated Nutritional Needs:   Kcal:  1400-1600  Protein:  70-80 gm  Fluid:  >/= 1.4 L  Clayton Bibles, MS, RD, LDN Inpatient Clinical Dietitian Pager: [phone] After Hours Pager: [phone]

## 2019-10-19 NOTE — Plan of Care (Signed)
Care plan goals met. Pt adequate for discharge.  

## 2019-10-24 ENCOUNTER — Other Ambulatory Visit: Payer: Self-pay | Admitting: Thoracic Surgery (Cardiothoracic Vascular Surgery)

## 2019-10-24 DIAGNOSIS — J9383 Other pneumothorax: Secondary | ICD-10-CM

## 2019-10-25 ENCOUNTER — Ambulatory Visit (INDEPENDENT_AMBULATORY_CARE_PROVIDER_SITE_OTHER): Payer: Medicare Other | Admitting: Thoracic Surgery (Cardiothoracic Vascular Surgery)

## 2019-10-25 ENCOUNTER — Ambulatory Visit
Admission: RE | Admit: 2019-10-25 | Discharge: 2019-10-25 | Disposition: A | Discharge status: Home / Self Care | Payer: Medicare Other | Source: Ambulatory Visit | Attending: Thoracic Surgery (Cardiothoracic Vascular Surgery) | Admitting: Thoracic Surgery (Cardiothoracic Vascular Surgery)

## 2019-10-25 ENCOUNTER — Encounter: Payer: Self-pay | Admitting: Thoracic Surgery (Cardiothoracic Vascular Surgery)

## 2019-10-25 ENCOUNTER — Other Ambulatory Visit: Payer: Self-pay

## 2019-10-25 VITALS — BP 103/63 | HR 104 | Temp 97.7°F | Resp 16 | Ht 65.0 in | Wt 88.0 lb

## 2019-10-25 DIAGNOSIS — Z09 Encounter for follow-up examination after completed treatment for conditions other than malignant neoplasm: Secondary | ICD-10-CM

## 2019-10-25 DIAGNOSIS — J939 Pneumothorax, unspecified: Secondary | ICD-10-CM | POA: Diagnosis not present

## 2019-10-25 DIAGNOSIS — J9383 Other pneumothorax: Secondary | ICD-10-CM | POA: Diagnosis not present

## 2019-10-25 DIAGNOSIS — J9382 Other air leak: Secondary | ICD-10-CM | POA: Diagnosis not present

## 2019-10-25 DIAGNOSIS — Z9689 Presence of other specified functional implants: Secondary | ICD-10-CM

## 2019-10-25 DIAGNOSIS — IMO0002 Reserved for concepts with insufficient information to code with codable children: Secondary | ICD-10-CM

## 2019-10-25 NOTE — Progress Notes (Signed)
[address]       [place] [address]             [phone]       HPI: Amanda [name] returns for scheduled follow-up visit  Amanda [name] is a 79 year old woman with a history of tobacco abuse, severe COPD, recurrent right-sided pneumothoraces, and dementia.  I first saw her in October 2019 after a right spontaneous pneumothorax.  She had been treated with the pigtail catheter and the air leak had resolved.  That was her second pneumothorax on the right side.  She had a CT which showed severe centrilobular and paraseptal emphysema.  She was not a candidate for elective surgery.  She presented to [facility] on New Year's Eve with shortness of breath and was found to have a right pneumothorax.  A pigtail was placed and she was transferred to [facility].  Of note she was diagnosed with Covid about a month prior to this occurrence.  She was seen initially by Dr. Roxy Manns and then I took over management from a surgical perspective.  She continued to have an air leak.  On 10/08/2019 her chest tube pulled out.  IR placed a new tube, but the lung never completely reexpanded after that.  I offered the option of IBV placement although it was a long shot given the degree of parenchymal destruction in her lungs and the amount of crossover between lobes.  That was done on 10/12/2019.  All of the upper lobe segmental bronchi were occluded and valve was also placed in the right middle lobe bronchus.  The air leak improved significantly early on but then over the next several days returned nearly to its original baseline.  She ultimately was discharged home with a tube in place on waterseal on 10/19/2019.  She complains of feeling short of breath when she tries to get up and walk.  She does not have any pain from the tube.  She remains on 3 L nasal cannula.  Past Medical History:  Diagnosis Date  . Arthritis   . Bladder cancer ([place])   . Blood transfusion without reported diagnosis   . COPD (chronic  obstructive pulmonary disease) ([place])   . COVID-19 07/2019  . Renal disorder    kidney stones  . Seizures ([place])   . 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 acetaminophen (TYLENOL) 500 MG tablet Take 500-1,000 mg by mouth daily as needed for mild pain or moderate pain.     Marland Kitchen albuterol (PROVENTIL HFA;VENTOLIN HFA) 108 (90 Base) MCG/ACT inhaler Inhale 1-2 puffs into the lungs every 6 (six) hours as needed for wheezing or shortness of breath. 1 Inhaler 0  . Cholecalciferol (VITAMIN D-1000 MAX ST) 1000 units tablet Take 1,000 Units by mouth daily.     Marland Kitchen docusate sodium (COLACE) 100 MG capsule Take 1 capsule (100 mg total) by mouth 2 (two) times daily. 10 capsule 0  . Multiple Vitamin (MULTIVITAMIN) capsule Take 1 capsule by mouth daily.     Marland Kitchen nystatin (MYCOSTATIN) 100000 UNIT/ML suspension Take 5 mLs (500,000 Units total) by mouth 4 (four) times daily for 7 days. 120 mL 0  . oxyCODONE-acetaminophen (PERCOCET/ROXICET) 5-325 MG tablet Take 1 tablet by mouth 3 (three) times daily.    . QUEtiapine (SEROQUEL) 25 MG tablet Take 1 tablet (25 mg total) by mouth every evening. 30 tablet 0  . SYMBICORT  80-4.5 MCG/ACT inhaler Inhale 2 puffs into the lungs 2 (two) times daily.    . vitamin C (ASCORBIC ACID) 250 MG tablet Take 500 mg by mouth daily.      No current facility-administered medications for this visit.    Physical Exam BP 103/63 (BP Location: Left Arm, Patient Position: Sitting, Cuff Size: Normal)   Pulse (!) 104   Temp 97.7 F (36.5 C)   Resp 16   Ht 5\' 5"  (1.651 m)   Wt 88 lb (39.9 kg)   SpO2 96% Comment: ON 3L O2  BMI 14.43 kg/m  79 year old woman in no acute distress Short-term memory loss Lungs with diminished breath sounds bilaterally Cardiac regular rate and rhythm Tube in place with no sign of infection  Diagnostic Tests: CHEST - 2 VIEW  COMPARISON:  10/19/2019  FINDINGS: Pigtail RIGHT  thoracostomy tube unchanged.  Endobronchial valves noted RIGHT upper lobe.  Normal heart size, mediastinal contours, and pulmonary vascularity.  Atherosclerotic calcification aorta.  Emphysematous and bronchitic changes consistent with COPD.  Biapical scarring greater on LEFT.  Chronic interstitial prominence at the mid to lower lungs slightly greater on LEFT unchanged.  Persistent RIGHT apex pneumothorax little changed.  No acute infiltrate or pleural effusion.  Bones demineralized.  IMPRESSION: Severe COPD changes with biapical scarring and RIGHT upper lobe endobronchial valves.  Persistent RIGHT apex pneumothorax despite thoracostomy tube.  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  Aortic Atherosclerosis (ICD10-I70.0).   Electronically Signed   By: Lavonia Dana M.D.   On: 10/25/2019 13:15 I personally reviewed the chest x-ray.  The space is slightly smaller than on her last chest x-ray prior to discharge but not significantly  Impression: Amanda [name] is a 79 year old woman with a history of tobacco abuse and severe COPD with recurrent right-sided pneumothoraces.  This is her third pneumothorax on the right side.  She has a chest tube in place but there is still a small space apically and laterally that has never resolved since the second tube was placed for this event.  That space may be slightly smaller but is not encouraging that it persists.  Air leak also appears slightly smaller but again at this point she is almost a month into this with continued air leak, therefore I think it is unlikely to stop on its own at this point.  I talked with Amanda [name] and her daughter about her options.  None of them are particularly good options.  The first would be to continue with the chest tube in place and to waterseal.  It is possible that he could stop, although I think it is less and less likely at this point.  The second option would be to go  back in place the valves to occlude the lower lobe bronchi.  This would eliminate the leak but she might be unacceptably short of breath trying to function on a single lung when her overall lung function is so compromised to begin with.  The third option would be that to try and staple off the area of the leak.  She is not a good operative candidate and that would have significant risk of major complications.  The final option would be to pull the tube and not reintervene further, but she and her daughter are not interested.  They will think about these options and we will discuss them further when she comes back next week.  Plan: Continue with chest tube to waterseal Return in 1 week with PA and  lateral chest x-ray  [name], MD [facility] [phone]

## 2019-10-31 ENCOUNTER — Other Ambulatory Visit: Payer: Self-pay | Admitting: Thoracic Surgery (Cardiothoracic Vascular Surgery)

## 2019-10-31 DIAGNOSIS — J9383 Other pneumothorax: Secondary | ICD-10-CM

## 2019-11-01 ENCOUNTER — Ambulatory Visit (INDEPENDENT_AMBULATORY_CARE_PROVIDER_SITE_OTHER): Payer: Medicare Other | Admitting: Thoracic Surgery (Cardiothoracic Vascular Surgery)

## 2019-11-01 ENCOUNTER — Ambulatory Visit
Admission: RE | Admit: 2019-11-01 | Discharge: 2019-11-01 | Disposition: A | Discharge status: Home / Self Care | Payer: Medicare Other | Source: Ambulatory Visit | Attending: Thoracic Surgery (Cardiothoracic Vascular Surgery) | Admitting: Thoracic Surgery (Cardiothoracic Vascular Surgery)

## 2019-11-01 ENCOUNTER — Other Ambulatory Visit: Payer: Self-pay

## 2019-11-01 ENCOUNTER — Encounter: Payer: Self-pay | Admitting: Thoracic Surgery (Cardiothoracic Vascular Surgery)

## 2019-11-01 VITALS — BP 119/69 | HR 105 | Temp 97.7°F | Resp 16 | Ht 65.0 in

## 2019-11-01 DIAGNOSIS — J9383 Other pneumothorax: Secondary | ICD-10-CM | POA: Diagnosis not present

## 2019-11-01 DIAGNOSIS — J939 Pneumothorax, unspecified: Secondary | ICD-10-CM

## 2019-11-01 DIAGNOSIS — IMO0002 Reserved for concepts with insufficient information to code with codable children: Secondary | ICD-10-CM

## 2019-11-01 DIAGNOSIS — Z9689 Presence of other specified functional implants: Secondary | ICD-10-CM

## 2019-11-01 DIAGNOSIS — J9382 Other air leak: Secondary | ICD-10-CM | POA: Diagnosis not present

## 2019-11-01 NOTE — Progress Notes (Signed)
Amanda [name]       [place] [address]             [phone]      HPI: Amanda [name] returns for follow-up of her persistent air leak  Amanda [name] is a 79 year old woman with history of tobacco abuse and severe COPD who presented on New Year's Eve with a recurrent right spontaneous pneumothorax.  She previously had pneumothorax back in October 2019.  She has had a persistent air leak with a small lateral apical space.  I placed IBV in the upper lobe and middle lobe which decreased but did not eliminate the leak.  She is not interested in pursuing surgery and was discharged home with a tube.  In the interim since her last visit a week ago she has been about the same.  She gets short of breath with relatively minimal activities.  She does not have any significant pain from the tube although it is uncomfortable.  She is concerned she might accidentally pulled the tube out.  Past Medical History:  Diagnosis Date  . Arthritis   . Bladder cancer ([place])   . Blood transfusion without reported diagnosis   . COPD (chronic obstructive pulmonary disease) ([place])   . COVID-19 07/2019  . Renal disorder    kidney stones  . Seizures ([place])   . Spontaneous pneumothorax - recurrent 05/2018   right     Current Outpatient Medications  Medication Sig Dispense Refill  . acetaminophen (TYLENOL) 500 MG tablet Take 500-1,000 mg by mouth daily as needed for mild pain or moderate pain.     Marland Kitchen albuterol (PROVENTIL HFA;VENTOLIN HFA) 108 (90 Base) MCG/ACT inhaler Inhale 1-2 puffs into the lungs every 6 (six) hours as needed for wheezing or shortness of breath. 1 Inhaler 0  . Cholecalciferol (VITAMIN D-1000 MAX ST) 1000 units tablet Take 1,000 Units by mouth daily.     Marland Kitchen docusate sodium (COLACE) 100 MG capsule Take 1 capsule (100 mg total) by mouth 2 (two) times daily. 10 capsule 0  . Multiple Vitamin (MULTIVITAMIN) capsule Take 1 capsule by mouth daily.     Marland Kitchen oxyCODONE-acetaminophen  (PERCOCET/ROXICET) 5-325 MG tablet Take 1 tablet by mouth 3 (three) times daily.    . QUEtiapine (SEROQUEL) 25 MG tablet Take 1 tablet (25 mg total) by mouth every evening. 30 tablet 0  . SYMBICORT 80-4.5 MCG/ACT inhaler Inhale 2 puffs into the lungs 2 (two) times daily.    . vitamin C (ASCORBIC ACID) 250 MG tablet Take 500 mg by mouth daily.      No current facility-administered medications for this visit.    Physical Exam BP 119/69 (BP Location: Left Arm, Patient Position: Sitting, Cuff Size: Normal)   Pulse (!) 105   Temp 97.7 F (36.5 C)   Resp 16   Ht 5\' 5"  (1.651 m)   SpO2 96% Comment: 3L O2  BMI 14.64 kg/m  Frail 79 year old woman in no acute distress Air leak from chest tube  Diagnostic Tests: CHEST - 2 VIEW  COMPARISON:  October 25, 2019.  FINDINGS: The heart size and mediastinal contours are within normal limits. Stable position of right-sided chest tube. Stable appearance of right apical pneumothorax. Stable interstitial densities are noted throughout both lungs most consistent with chronic interstitial lung disease. No significant changes noted compared to prior exam. The visualized skeletal structures are unremarkable.  IMPRESSION: Stable appearance of right apical pneumothorax. Stable position of right-sided chest tube. Stable interstitial  densities throughout both lungs most consistent with chronic interstitial lung disease.   Electronically Signed   By: Marijo Conception M.D.   On: 11/01/2019 15:48 I personally reviewed the chest x-ray images and concur with the findings noted above  Impression: Amanda [name] is a 79 year old woman with history of tobacco abuse and severe COPD with recurrent right-sided pneumothoraces.  She had a pneumothorax with a persistent air leak.  IBV decrease the air leak but did not eliminate it.  She was anxious to get home and refused surgery so she was discharged with a tube.  She continues to have an air leak.  I again  discussed with her and her daughter that it is extremely unlikely to stop on its own at this point.  We again discussed the options of continuing as we are with a chest tube in place.  Place additional IBV.  Or surgical resection.  She would be a high risk surgical candidate but I think that is probably what it will take.  We could try the valves, but I do not think she would have an exceptional functional status with essentially her entire right lung occluded.  They are very anxious, which is understandable.  They went to think over their options so we will see her back in a week with a repeat chest x-ray.    [name], MD [facility] [phone]

## 2019-11-07 ENCOUNTER — Other Ambulatory Visit: Payer: Self-pay | Admitting: Thoracic Surgery (Cardiothoracic Vascular Surgery)

## 2019-11-07 DIAGNOSIS — J9383 Other pneumothorax: Secondary | ICD-10-CM

## 2019-11-08 ENCOUNTER — Other Ambulatory Visit: Payer: Self-pay

## 2019-11-08 ENCOUNTER — Encounter: Payer: Self-pay | Admitting: Thoracic Surgery (Cardiothoracic Vascular Surgery)

## 2019-11-08 ENCOUNTER — Ambulatory Visit
Admission: RE | Admit: 2019-11-08 | Discharge: 2019-11-08 | Disposition: A | Discharge status: Home / Self Care | Payer: Medicare Other | Source: Ambulatory Visit | Attending: Thoracic Surgery (Cardiothoracic Vascular Surgery) | Admitting: Thoracic Surgery (Cardiothoracic Vascular Surgery)

## 2019-11-08 ENCOUNTER — Ambulatory Visit (INDEPENDENT_AMBULATORY_CARE_PROVIDER_SITE_OTHER): Payer: Medicare Other | Admitting: Thoracic Surgery (Cardiothoracic Vascular Surgery)

## 2019-11-08 VITALS — BP 102/61 | HR 119 | Temp 98.5°F | Resp 20 | Ht 65.0 in | Wt 88.0 lb

## 2019-11-08 DIAGNOSIS — J9312 Secondary spontaneous pneumothorax: Secondary | ICD-10-CM

## 2019-11-08 DIAGNOSIS — J9383 Other pneumothorax: Secondary | ICD-10-CM

## 2019-11-08 NOTE — Progress Notes (Signed)
[address]       [place] [address]             [phone]      HPI: Amanda [name] returns for a scheduled follow-up  Amanda [name] is a 79 year old woman with a history of tobacco abuse, severe COPD, COVID-19, bladder cancer, arthritis, seizures, and multiple right spontaneous pneumothoraces.  She presented on New Year's Eve with a recurrent right spontaneous pneumothorax.  Her last pneumo prior to that had been October 2019.  She had a chest tube placed initially the lung completely reexpanded but during her hospitalization the tube became dislodged she developed a apical pneumothorax and then when a tube was replaced the lung never completely reexpanded again.  She had a persistent air leak.  We placed IBV's in the upper and middle lobes which initially decrease the leak but did not eliminate it never time the leak got larger likely due to cross ventilation between the lobes.  She was not interested in pursuing surgery and was discharged home with a tube in place.  During her hospitalization she had a lot of difficulty with confusion, agitation, and paranoia.  She gets short of breath with relatively minimal activity even on 3 L nasal cannula.  She has some discomfort from the tube.  She is using Tylenol for that.  Past Medical History:  Diagnosis Date  . Arthritis   . Bladder cancer ([place])   . Blood transfusion without reported diagnosis   . COPD (chronic obstructive pulmonary disease) ([place])   . COVID-19 07/2019  . Renal disorder    kidney stones  . Seizures ([place])   . 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 acetaminophen (TYLENOL) 500 MG tablet Take 500-1,000 mg by mouth daily as needed for mild pain or moderate pain.     Marland Kitchen albuterol (PROVENTIL HFA;VENTOLIN HFA) 108 (90 Base) MCG/ACT inhaler Inhale 1-2 puffs into the lungs every 6 (six) hours as needed for wheezing or shortness  of breath. 1 Inhaler 0  . Cholecalciferol (VITAMIN D-1000 MAX ST) 1000 units tablet Take 1,000 Units by mouth daily.     Marland Kitchen docusate sodium (COLACE) 100 MG capsule Take 1 capsule (100 mg total) by mouth 2 (two) times daily. 10 capsule 0  . Multiple Vitamin (MULTIVITAMIN) capsule Take 1 capsule by mouth daily.     Marland Kitchen oxyCODONE-acetaminophen (PERCOCET/ROXICET) 5-325 MG tablet Take 1 tablet by mouth 3 (three) times daily.    . QUEtiapine (SEROQUEL) 25 MG tablet Take 1 tablet (25 mg total) by mouth every evening. 30 tablet 0  . SYMBICORT 80-4.5 MCG/ACT inhaler Inhale 2 puffs into the lungs 2 (two) times daily.    . vitamin C (ASCORBIC ACID) 250 MG tablet Take 500 mg by mouth daily.      No current facility-administered medications for this visit.    Physical Exam BP 102/61 (BP Location: Left Arm, Patient Position: Sitting, Cuff Size: Normal)   Pulse (!) 119   Temp 98.5 F (36.9 C) (Skin)   Resp 20   Ht 5\' 5"  (1.651 m)   Wt 88 lb (39.9 kg)   SpO2 96% Comment: on O2 @ 3L via nasal cannula  BMI 14.64 kg/m  Frail 79 year old woman in no acute distress Nasal cannula oxygen in place Short of breath with talking Positive air leak No rales or wheezing Cardiac tachycardic and regular  Diagnostic Tests:  CHEST - 2 VIEW  COMPARISON:  Multiple previous chest films.  FINDINGS: The right-sided chest tube is stable. No change in right-sided pneumothorax.  Stable right-sided interbronchial valves.  Severe chronic underlying emphysematous changes and pulmonary scarring. No acute overlying pulmonary process.  IMPRESSION: Stable right-sided chest tube with persistent right-sided pneumothorax.  Underlying severe chronic lung disease without acute overlying pulmonary process.   Electronically Signed   By: Marijo Sanes M.D.   On: 11/08/2019 14:50 I personally reviewed the chest x-ray and concur with the findings noted above  Impression: Amanda [name] is a 79 year old woman with  end-stage COPD who presented with a recurrent right pneumothorax.  She had a chest tube placed.  She now has a tube in place but a persistent apical lateral space with an ongoing air leak.  If anything her air leak is larger this week than it was last week.  I had a long and frank discussion with Mrs. Kaltenbach, her sister, and her daughter.  I explained to them that she has severe, end-stage COPD.  At this point I do not think there is any hope for this leak to resolve spontaneously.  Options would be to continue with chest tube drainage and try to get hospice involved versus more aggressive intervention to try to get the leak to stop.  We discussed the options of surgery versus additional IBV placement.  She is a poor candidate for surgery, which is how we got to where we are now.  She and her daughter have been adamantly against surgery from the beginning.  I would prefer not to operate on her but I do not know if we will be afforded that luxury.  Also discussed the possibility of placing additional endobronchial valves to occlude the lower lobe as well.  I am not sure if she will tolerate that although the loss of additional lung function would be somewhat mitigated by eliminating the air leak.  The only way to know would be to place the valves and then observe her.  If her dyspnea is unacceptable we could then go back and remove the valves.  Neither of these options are ideal.  However, we are unlikely to be successful with continuing with chest tube drainage indefinitely.  Sooner or later the tube will get clogged or pulled out.  They had numerous questions which I answered to the best of my ability.  I just made it clear that I do not think there is any hope for this week to stop on its own at this point.  Plan: She and her family will discuss matters Return in 1 week with chest x-ray  [name], MD [facility] [phone]

## 2019-11-14 ENCOUNTER — Other Ambulatory Visit: Payer: Self-pay | Admitting: Thoracic Surgery (Cardiothoracic Vascular Surgery)

## 2019-11-14 DIAGNOSIS — J9383 Other pneumothorax: Secondary | ICD-10-CM

## 2019-11-15 ENCOUNTER — Other Ambulatory Visit: Payer: Self-pay

## 2019-11-15 ENCOUNTER — Ambulatory Visit (INDEPENDENT_AMBULATORY_CARE_PROVIDER_SITE_OTHER): Payer: Medicare Other | Admitting: Thoracic Surgery (Cardiothoracic Vascular Surgery)

## 2019-11-15 ENCOUNTER — Other Ambulatory Visit: Payer: Self-pay | Admitting: *Deleted

## 2019-11-15 ENCOUNTER — Ambulatory Visit
Admission: RE | Admit: 2019-11-15 | Discharge: 2019-11-15 | Disposition: A | Discharge status: Home / Self Care | Payer: Medicare Other | Source: Ambulatory Visit | Attending: Thoracic Surgery (Cardiothoracic Vascular Surgery) | Admitting: Thoracic Surgery (Cardiothoracic Vascular Surgery)

## 2019-11-15 ENCOUNTER — Encounter: Payer: Self-pay | Admitting: *Deleted

## 2019-11-15 ENCOUNTER — Encounter: Payer: Self-pay | Admitting: Thoracic Surgery (Cardiothoracic Vascular Surgery)

## 2019-11-15 VITALS — BP 107/64 | HR 112 | Temp 97.3°F | Resp 16 | Ht 65.0 in | Wt 89.0 lb

## 2019-11-15 DIAGNOSIS — J9382 Other air leak: Secondary | ICD-10-CM

## 2019-11-15 DIAGNOSIS — J9383 Other pneumothorax: Secondary | ICD-10-CM

## 2019-11-15 DIAGNOSIS — IMO0002 Reserved for concepts with insufficient information to code with codable children: Secondary | ICD-10-CM

## 2019-11-15 DIAGNOSIS — Z9689 Presence of other specified functional implants: Secondary | ICD-10-CM

## 2019-11-15 DIAGNOSIS — J939 Pneumothorax, unspecified: Secondary | ICD-10-CM

## 2019-11-15 NOTE — H&P (View-Only) (Signed)
[address]       [place] [address]             [phone]       HPI: Amanda [name] returns for follow-up of her persistent air leak  Amanda [name] is a 79 year old woman with a history of tobacco abuse, end-stage COPD, COVID-19 fall 2020, bladder cancer, arthritis, seizures, and recurrent right pneumothoraces.  She presented on New Year's Eve with a right spontaneous pneumothorax.  A tube was placed and her lung completely reexpanded.  At some point during the hospitalization her tube was dislodged and a new tube was placed but the lung never completely reexpanded again.  She had a persistent air leak.  She is a poor surgical candidate so we offered off label use of intrabronchial valves.  Valves were placed in the upper and middle lobes which decrease the leak but it then got larger over time.  She was discharged with a chest tube in place.  I been following her weekly since then.  There is been no improvement in the chest x-ray or the air leak over that time.  I have talked to her multiple times about the need for more aggressive intervention.  She now returns to discuss options once again.  She says her respiratory status is about the same.  She does get short of breath with relatively minimal activity.  She has been working with physical therapy.  Past Medical History:  Diagnosis Date  . Arthritis   . Bladder cancer ([place])   . Blood transfusion without reported diagnosis   . COPD (chronic obstructive pulmonary disease) ([place])   . COVID-19 07/2019  . Renal disorder    kidney stones  . Seizures ([place])   . 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 acetaminophen (TYLENOL) 500 MG tablet Take 500-1,000 mg by mouth daily as needed for mild pain or moderate pain.     Marland Kitchen albuterol (PROVENTIL HFA;VENTOLIN HFA) 108 (90 Base) MCG/ACT inhaler Inhale 1-2 puffs into the lungs every 6 (six) hours  as needed for wheezing or shortness of breath. 1 Inhaler 0  . Cholecalciferol (VITAMIN D-1000 MAX ST) 1000 units tablet Take 1,000 Units by mouth daily.     Marland Kitchen docusate sodium (COLACE) 100 MG capsule Take 1 capsule (100 mg total) by mouth 2 (two) times daily. 10 capsule 0  . Multiple Vitamin (MULTIVITAMIN) capsule Take 1 capsule by mouth daily.     Marland Kitchen oxyCODONE-acetaminophen (PERCOCET/ROXICET) 5-325 MG tablet Take 1 tablet by mouth 3 (three) times daily.    . SYMBICORT 80-4.5 MCG/ACT inhaler Inhale 2 puffs into the lungs 2 (two) times daily.    . vitamin C (ASCORBIC ACID) 250 MG tablet Take 500 mg by mouth daily.      No current facility-administered medications for this visit.    Physical Exam BP 107/64 (BP Location: Right Arm, Patient Position: Sitting, Cuff Size: Small)   Pulse (!) 112   Temp (!) 97.3 F (36.3 C)   Resp 16   Ht 5\' 5"  (1.651 m)   Wt 89 lb (40.4 kg)   SpO2 99% Comment: ON 3L O2  BMI 14.72 kg/m  79 year old woman in no acute distress Alert and oriented x3 Lungs diminished breath sounds bilaterally Large air leak via right chest tube Cardiac regular rate and rhythm  Diagnostic Tests: CHEST - 2 VIEW  COMPARISON:  11/08/2019  FINDINGS: The right-sided chest tube is stable. There is a persistent right apical pneumothorax estimated at 20%.  Stable underlying severe lung disease.  Stable right-sided inter bronchial valves.  No new/acute pulmonary process or pleural effusion.  IMPRESSION: 1. Stable right-sided chest tube with persistent 20% right apical pneumothorax. 2. Stable underlying severe lung disease without overlying acute/new pulmonary process.   Electronically Signed   By: Marijo Sanes M.D.   On: 11/15/2019 12:45 I personally reviewed the chest x-ray images and concur with the findings noted above  Impression: Amanda [name] is a 79 year old woman with end-stage COPD secondary to tobacco abuse who has had multiple right-sided  pneumothoraces.  She had 2 pneumothoraxes back in the fall 2019 in relatively close proximity.  She was managed by pulmonary on both occasions.  She then was sent to me a couple months later and I did not recommend surgery due to her age, severity of COPD, and comorbidities.  She then presented with a recurrent pneumothorax on 09/29/2019.  She had a rather prolonged hospitalization.  She refused surgery, but was agreeable to trying IBV's.  She continued to have an air leak after IBV placement.  She again refused surgery and went home with a tube in place.  She has now been at home about a month and the leak is not improving.  I again explained that it is unlikely that the leaks can heal at this point in time.  She may require surgery, but is a very high risk surgical candidate.  I think a better option would be to try to place additional IBV.  It is entirely possible that she will tolerate that from a respiratory standpoint, and if that is the case then we may have to go back and take them out.  It certainly less invasive option then surgical bleb resection and carries less risk of perioperative morbidity mortality.  One of her primary concerns is medication she may receive while in the hospital.  She was on Xanax and Seroquel during her last hospitalization.  She was having a lot of trouble with sundowning and paranoia.  She wanted me to guarantee that she would not receive any medications at that time.  I informed her that I cannot guarantee that.  I certainly will not give her any psychoactive medications unless she develops sundowning or paranoia, and at that point we may have to given to her for her safety.  I informed the patient, her sister, and her daughter of the indications, risks, benefits, and alternatives.  They understand the risks include those associated with general anesthesia.  They understand the risk include, but not limited to death, MI, DVT, PE, stroke, failure to resolve air leak, as  well as the possibility of other unforeseeable complications.  I have made it quite clear that there is no guarantee of success, but I think this is our best option among several less than enticing options.  Plan: Return in 1 week with PA and lateral chest x-ray Bronchoscopy with IBV placement on Thursday, 12/01/2019  [name], MD [facility] [phone]

## 2019-11-15 NOTE — Progress Notes (Signed)
[address]       [place] [address]             [phone]       HPI: [name] returns for follow-up of her persistent air leak  Amanda [name] is a 79 year old woman with a history of tobacco abuse, end-stage COPD, COVID-19 fall 2020, bladder cancer, arthritis, seizures, and recurrent right pneumothoraces.  She presented on New Year's Eve with a right spontaneous pneumothorax.  A tube was placed and her lung completely reexpanded.  At some point during the hospitalization her tube was dislodged and a new tube was placed but the lung never completely reexpanded again.  She had a persistent air leak.  She is a poor surgical candidate so we offered off label use of intrabronchial valves.  Valves were placed in the upper and middle lobes which decrease the leak but it then got larger over time.  She was discharged with a chest tube in place.  I been following her weekly since then.  There is been no improvement in the chest x-ray or the air leak over that time.  I have talked to her multiple times about the need for more aggressive intervention.  She now returns to discuss options once again.  She says her respiratory status is about the same.  She does get short of breath with relatively minimal activity.  She has been working with physical therapy.  Past Medical History:  Diagnosis Date  . Arthritis   . Bladder cancer ([place])   . Blood transfusion without reported diagnosis   . COPD (chronic obstructive pulmonary disease) ([place])   . COVID-19 07/2019  . Renal disorder    kidney stones  . Seizures ([place])   . 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 acetaminophen (TYLENOL) 500 MG tablet Take 500-1,000 mg by mouth daily as needed for mild pain or moderate pain.     Marland Kitchen albuterol (PROVENTIL HFA;VENTOLIN HFA) 108 (90 Base) MCG/ACT inhaler Inhale 1-2 puffs into the lungs every 6 (six) hours  as needed for wheezing or shortness of breath. 1 Inhaler 0  . Cholecalciferol (VITAMIN D-1000 MAX ST) 1000 units tablet Take 1,000 Units by mouth daily.     Marland Kitchen docusate sodium (COLACE) 100 MG capsule Take 1 capsule (100 mg total) by mouth 2 (two) times daily. 10 capsule 0  . Multiple Vitamin (MULTIVITAMIN) capsule Take 1 capsule by mouth daily.     Marland Kitchen oxyCODONE-acetaminophen (PERCOCET/ROXICET) 5-325 MG tablet Take 1 tablet by mouth 3 (three) times daily.    . SYMBICORT 80-4.5 MCG/ACT inhaler Inhale 2 puffs into the lungs 2 (two) times daily.    . vitamin C (ASCORBIC ACID) 250 MG tablet Take 500 mg by mouth daily.      No current facility-administered medications for this visit.    Physical Exam BP 107/64 (BP Location: Right Arm, Patient Position: Sitting, Cuff Size: Small)   Pulse (!) 112   Temp (!) 97.3 F (36.3 C)   Resp 16   Ht 5\' 5"  (1.651 m)   Wt 89 lb (40.4 kg)   SpO2 99% Comment: ON 3L O2  BMI 14.76 kg/m  79 year old woman in no acute distress Alert and oriented x3 Lungs diminished breath sounds bilaterally Large air leak via right chest tube Cardiac regular rate and rhythm  Diagnostic Tests: CHEST - 2 VIEW  COMPARISON:  11/08/2019  FINDINGS: The right-sided chest tube is stable. There is a persistent right apical pneumothorax estimated at 20%.  Stable underlying severe lung disease.  Stable right-sided inter bronchial valves.  No new/acute pulmonary process or pleural effusion.  IMPRESSION: 1. Stable right-sided chest tube with persistent 20% right apical pneumothorax. 2. Stable underlying severe lung disease without overlying acute/new pulmonary process.   Electronically Signed   By: Marijo Sanes M.D.   On: 11/15/2019 12:45 I personally reviewed the chest x-ray images and concur with the findings noted above  Impression: Amanda [name] is a 79 year old woman with end-stage COPD secondary to tobacco abuse who has had multiple right-sided  pneumothoraces.  She had 2 pneumothoraxes back in the fall 2019 in relatively close proximity.  She was managed by pulmonary on both occasions.  She then was sent to me a couple months later and I did not recommend surgery due to her age, severity of COPD, and comorbidities.  She then presented with a recurrent pneumothorax on 09/29/2019.  She had a rather prolonged hospitalization.  She refused surgery, but was agreeable to trying IBV's.  She continued to have an air leak after IBV placement.  She again refused surgery and went home with a tube in place.  She has now been at home about a month and the leak is not improving.  I again explained that it is unlikely that the leaks can heal at this point in time.  She may require surgery, but is a very high risk surgical candidate.  I think a better option would be to try to place additional IBV.  It is entirely possible that she will tolerate that from a respiratory standpoint, and if that is the case then we may have to go back and take them out.  It certainly less invasive option then surgical bleb resection and carries less risk of perioperative morbidity mortality.  One of her primary concerns is medication she may receive while in the hospital.  She was on Xanax and Seroquel during her last hospitalization.  She was having a lot of trouble with sundowning and paranoia.  She wanted me to guarantee that she would not receive any medications at that time.  I informed her that I cannot guarantee that.  I certainly will not give her any psychoactive medications unless she develops sundowning or paranoia, and at that point we may have to given to her for her safety.  I informed the patient, her sister, and her daughter of the indications, risks, benefits, and alternatives.  They understand the risks include those associated with general anesthesia.  They understand the risk include, but not limited to death, MI, DVT, PE, stroke, failure to resolve air leak, as  well as the possibility of other unforeseeable complications.  I have made it quite clear that there is no guarantee of success, but I think this is our best option among several less than enticing options.  Plan: Return in 1 week with PA and lateral chest x-ray Bronchoscopy with IBV placement on Thursday, 12/01/2019  [name], MD [facility] [phone]

## 2019-11-23 ENCOUNTER — Other Ambulatory Visit: Payer: Self-pay | Admitting: Thoracic Surgery (Cardiothoracic Vascular Surgery)

## 2019-11-23 ENCOUNTER — Other Ambulatory Visit: Payer: Self-pay | Admitting: *Deleted

## 2019-11-23 ENCOUNTER — Ambulatory Visit: Payer: Medicare Other | Admitting: Thoracic Surgery (Cardiothoracic Vascular Surgery)

## 2019-11-23 ENCOUNTER — Ambulatory Visit
Admission: RE | Admit: 2019-11-23 | Discharge: 2019-11-23 | Disposition: A | Discharge status: Home / Self Care | Payer: Medicare Other | Source: Ambulatory Visit | Attending: Thoracic Surgery (Cardiothoracic Vascular Surgery) | Admitting: Thoracic Surgery (Cardiothoracic Vascular Surgery)

## 2019-11-23 ENCOUNTER — Other Ambulatory Visit: Payer: Self-pay

## 2019-11-23 DIAGNOSIS — J939 Pneumothorax, unspecified: Secondary | ICD-10-CM

## 2019-11-23 DIAGNOSIS — J9383 Other pneumothorax: Secondary | ICD-10-CM

## 2019-11-24 ENCOUNTER — Telehealth: Payer: Self-pay | Admitting: Thoracic Surgery (Cardiothoracic Vascular Surgery)

## 2019-11-24 ENCOUNTER — Ambulatory Visit: Payer: Medicare Other | Admitting: Thoracic Surgery (Cardiothoracic Vascular Surgery)

## 2019-11-24 NOTE — Telephone Encounter (Signed)
      [address]       [place] [address]             [phone]      I called Amanda [name] for her follow-up visit as she has difficulty getting to the office.  She says that her breathing is about the same.  It has not worsened nor improved significantly.  CHEST - 2 VIEW  COMPARISON:  11/15/2019  FINDINGS: Right thoracostomy tube remains in place in the upper portion of the chest, apparently within a pleural air collection that appears very similar to the study of 8 days ago. Very small air-fluid levels along the inferolateral aspect. 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 Upper lobe airway valves remain evident.  IMPRESSION: Stable exam since 8 days ago. Persistent right pneumothorax with thoracostomy tube. No worsening or new finding.   Electronically Signed   By: Nelson Chimes M.D.   On: 11/23/2019 15:11 I personally reviewed the chest x-ray images and concur with the findings noted above.  Impression-Amanda [name] x-ray is unchanged.  I am not able to evaluate for air leak but she says she does see bubbles.  I again emphasized that we are going to have to intervene to get this air leak to stop.  She had a lot of questions about how well she would tolerate the valves.  I informed her that there is really no way to know that we will just have to see how she does and respond accordingly.  Plan is for bronchoscopy and IBV placement next week.  Revonda Standard Roxan Hockey, MD [facility] [phone]

## 2019-11-25 ENCOUNTER — Encounter (INDEPENDENT_AMBULATORY_CARE_PROVIDER_SITE_OTHER): Payer: Self-pay

## 2019-11-25 ENCOUNTER — Ambulatory Visit (INDEPENDENT_AMBULATORY_CARE_PROVIDER_SITE_OTHER): Payer: Medicare Other

## 2019-11-25 ENCOUNTER — Other Ambulatory Visit: Payer: Self-pay

## 2019-11-25 VITALS — Temp 98.1°F

## 2019-11-25 DIAGNOSIS — J90 Pleural effusion, not elsewhere classified: Secondary | ICD-10-CM | POA: Diagnosis not present

## 2019-11-25 DIAGNOSIS — J9383 Other pneumothorax: Secondary | ICD-10-CM

## 2019-11-25 NOTE — Progress Notes (Signed)
Pt comes in today d/t drainage collection chamber in Pleur-Evac being full and also for dressing change. Pt is a/o and w/o complaint. Dgt present. 400 ml of clear liquid aspirated and emptied from drainage chamber while keeping unit in upright position. Pt currently receiving skilled nursing visits, but nurse visit for dressing change today was cancelled d/t this office visit.  Dressing to R upper chest is removed and replaced (w/ vaseline gauze and sterile 4 x 4's) using sterile technique. Scant amount of crusted tan drainage was noted on tubing at skin entry site; removed w/ alcohol wipe. Site is w/o redness or swelling. Pt and dgt instructed to call [facility] prn problems/concerns.

## 2019-11-28 NOTE — Progress Notes (Signed)
[facility] - [place], [place] [address] Phone: [phone] Fax: [phone]      Your procedure is scheduled on Thursday, December 01, 2019.  Report to [facility] Main Entrance "A" at 09:30 A.M., and check in at the Admitting office.  Call this number if you have problems the morning of surgery:  [phone]  Call [phone] if you have any questions prior to your surgery date Monday-Friday 8am-4pm    Remember:  Do not eat or drink after midnight the night before your surgery   Take these medicines the morning of surgery with A SIP OF: Acetaminophen (Tylenol) - if needed Albuterol (Proventil/Ventolin) inhaler - if needed Oxycodone-acetaminophen (Percocet) - if needed Symbicort inhaler  7 days prior to surgery STOP taking any Aspirin (unless otherwise instructed by your surgeon), Aleve, Naproxen, Ibuprofen, Motrin, Advil, Goody's, BC's, all herbal medications, fish oil, and all vitamins.    The Morning of Surgery  Do not wear jewelry, make-up or nail polish.  Do not wear lotions, powders, perfumes, or deodorant  Do not shave 48 hours prior to surgery.  Men may shave face and neck.  Do not bring valuables to the hospital.  [facility] is not responsible for any belongings or valuables.  If you are a smoker, DO NOT Smoke 24 hours prior to surgery  If you wear a CPAP at night please bring your mask the morning of surgery   Remember that you must have someone to transport you home after your surgery, and remain with you for 24 hours if you are discharged the same day.   Please bring cases for contacts, glasses, hearing aids, dentures or bridgework because it cannot be worn into surgery.    Leave your suitcase in the car.  After surgery it may be brought to your room.  For patients admitted to the hospital, discharge time will be determined by your treatment team.  Patients discharged the day of surgery will not be allowed to  drive home.    Special instructions:   [place]- Preparing For Surgery  Before surgery, you can play an important role. Because skin is not sterile, your skin needs to be as free of germs as possible. You can reduce the number of germs on your skin by washing with CHG (chlorahexidine gluconate) Soap before surgery.  CHG is an antiseptic cleaner which kills germs and bonds with the skin to continue killing germs even after washing.    Oral Hygiene is also important to reduce your risk of infection.  Remember - BRUSH YOUR TEETH THE MORNING OF SURGERY WITH YOUR REGULAR TOOTHPASTE  Please do not use if you have an allergy to CHG or antibacterial soaps. If your skin becomes reddened/irritated stop using the CHG.  Do not shave (including legs and underarms) for at least 48 hours prior to first CHG shower. It is OK to shave your face.  Please follow these instructions carefully.   1. Shower the NIGHT BEFORE SURGERY and the MORNING OF SURGERY with CHG Soap.   2. If you chose to wash your hair, wash your hair first as usual with your normal shampoo.  3. After you shampoo, rinse your hair and body thoroughly to remove the shampoo.  4. Use CHG as you would any other liquid soap. You can apply CHG directly to the skin and wash gently with a scrungie or a clean washcloth.   5. Apply the CHG Soap to your body  ONLY FROM THE NECK DOWN.  Do not use on open wounds or open sores. Avoid contact with your eyes, ears, mouth and genitals (private parts). Wash Face and genitals (private parts)  with your normal soap.   6. Wash thoroughly, paying special attention to the area where your surgery will be performed.  7. Thoroughly rinse your body with warm water from the neck down.  8. DO NOT shower/wash with your normal soap after using and rinsing off the CHG Soap.  9. Pat yourself dry with a CLEAN TOWEL.  10. Wear CLEAN PAJAMAS to bed the night before surgery, wear comfortable clothes the morning of  surgery  11. Place CLEAN SHEETS on your bed the night of your first shower and DO NOT SLEEP WITH PETS.    Day of Surgery:  Please shower the morning of surgery with the CHG soap Do not apply any deodorants/lotions. Please wear clean clothes to the hospital/surgery center.   Remember to brush your teeth WITH YOUR REGULAR TOOTHPASTE.   Please read over the following fact sheets that you were given.

## 2019-11-29 ENCOUNTER — Ambulatory Visit (HOSPITAL_COMMUNITY)
Admission: RE | Admit: 2019-11-29 | Discharge: 2019-11-29 | Disposition: A | Discharge status: Home / Self Care | Payer: Medicare Other | Source: Ambulatory Visit | Attending: Thoracic Surgery (Cardiothoracic Vascular Surgery) | Admitting: Thoracic Surgery (Cardiothoracic Vascular Surgery)

## 2019-11-29 ENCOUNTER — Encounter (HOSPITAL_COMMUNITY)
Admission: RE | Admit: 2019-11-29 | Discharge: 2019-11-29 | Disposition: A | Discharge status: Home / Self Care | Payer: Medicare Other | Source: Ambulatory Visit | Attending: Thoracic Surgery (Cardiothoracic Vascular Surgery) | Admitting: Thoracic Surgery (Cardiothoracic Vascular Surgery)

## 2019-11-29 ENCOUNTER — Other Ambulatory Visit: Payer: Self-pay

## 2019-11-29 ENCOUNTER — Encounter (HOSPITAL_COMMUNITY): Payer: Self-pay

## 2019-11-29 ENCOUNTER — Other Ambulatory Visit (HOSPITAL_COMMUNITY)
Admission: RE | Admit: 2019-11-29 | Discharge: 2019-11-29 | Disposition: A | Discharge status: Home / Self Care | Payer: Medicare Other | Source: Ambulatory Visit | Attending: Thoracic Surgery (Cardiothoracic Vascular Surgery) | Admitting: Thoracic Surgery (Cardiothoracic Vascular Surgery)

## 2019-11-29 DIAGNOSIS — IMO0002 Reserved for concepts with insufficient information to code with codable children: Secondary | ICD-10-CM

## 2019-11-29 DIAGNOSIS — J9382 Other air leak: Secondary | ICD-10-CM

## 2019-11-29 DIAGNOSIS — Z87891 Personal history of nicotine dependence: Secondary | ICD-10-CM | POA: Diagnosis not present

## 2019-11-29 DIAGNOSIS — J449 Chronic obstructive pulmonary disease, unspecified: Secondary | ICD-10-CM | POA: Diagnosis not present

## 2019-11-29 DIAGNOSIS — Z01818 Encounter for other preprocedural examination: Secondary | ICD-10-CM | POA: Diagnosis not present

## 2019-11-29 DIAGNOSIS — Z79899 Other long term (current) drug therapy: Secondary | ICD-10-CM | POA: Insufficient documentation

## 2019-11-29 DIAGNOSIS — Z20822 Contact with and (suspected) exposure to covid-19: Secondary | ICD-10-CM | POA: Insufficient documentation

## 2019-11-29 DIAGNOSIS — Z8616 Personal history of COVID-19: Secondary | ICD-10-CM | POA: Diagnosis not present

## 2019-11-29 HISTORY — DX: Pneumonia, unspecified organism: J18.9

## 2019-11-29 HISTORY — DX: Dyspnea, unspecified: R06.00

## 2019-11-29 HISTORY — DX: Personal history of urinary calculi: Z87.442

## 2019-11-29 LAB — SURGICAL PCR SCREEN
MRSA, PCR: NEGATIVE
Staphylococcus aureus: NEGATIVE

## 2019-11-29 LAB — PROTIME-INR
INR: 1 (ref 0.8–1.2)
Prothrombin Time: 12.8 seconds (ref 11.4–15.2)

## 2019-11-29 LAB — COMPREHENSIVE METABOLIC PANEL
ALT: 11 U/L (ref 0–44)
AST: 22 U/L (ref 15–41)
Albumin: 3.7 g/dL (ref 3.5–5.0)
Alkaline Phosphatase: 64 U/L (ref 38–126)
Anion gap: 12 (ref 5–15)
BUN: 22 mg/dL (ref 8–23)
CO2: 27 mmol/L (ref 22–32)
Calcium: 10.5 mg/dL — ABNORMAL HIGH (ref 8.9–10.3)
Chloride: 103 mmol/L (ref 98–111)
Creatinine, Ser: 0.78 mg/dL (ref 0.44–1.00)
GFR calc Af Amer: >60 mL/min (ref >60)
GFR calc non Af Amer: >60 mL/min (ref >60)
Glucose, Bld: 121 mg/dL — ABNORMAL HIGH (ref 70–99)
Potassium: 4.5 mmol/L (ref 3.5–5.1)
Sodium: 142 mmol/L (ref 135–145)
Total Bilirubin: 0.5 mg/dL (ref 0.3–1.2)
Total Protein: 7.3 g/dL (ref 6.5–8.1)

## 2019-11-29 LAB — CBC
HCT: 42 % (ref 36.0–46.0)
Hemoglobin: 12.6 g/dL (ref 12.0–15.0)
MCH: 28.8 pg (ref 26.0–34.0)
MCHC: 30 g/dL (ref 30.0–36.0)
MCV: 96.1 fL (ref 80.0–100.0)
Platelets: 278 10*3/uL (ref 150–400)
RBC: 4.37 MIL/uL (ref 3.87–5.11)
RDW: 14.4 % (ref 11.5–15.5)
WBC: 8 10*3/uL (ref 4.0–10.5)
nRBC: 0 % (ref 0.0–0.2)

## 2019-11-29 LAB — SARS CORONAVIRUS 2 (TAT 6-24 HRS): SARS Coronavirus 2: NEGATIVE

## 2019-11-29 LAB — APTT: aPTT: 32 seconds (ref 24–36)

## 2019-11-29 NOTE — Progress Notes (Signed)
PCP - Lemmie Evens, MD Cardiologist - Denies GI- Clent Jacks, MD  PPM/ICD - Denies  Chest x-ray - 11/29/19 EKG - N/A Stress Test - Denies ECHO - Denies Cardiac Cath - Denies  Sleep Study - Denies  Patient denies being a diabetic.  Blood Thinner Instructions: N/A Aspirin Instructions: N/A  ERAS Protcol - N/A PRE-SURGERY Ensure or G2- N/A  COVID TEST- 11/29/19   Anesthesia review: No.  Patient denies shortness of breath, fever, cough and chest pain at PAT appointment   All instructions explained to the patient, with a verbal understanding of the material. Patient agrees to go over the instructions while at home for a better understanding. Patient also instructed to self quarantine after being tested for COVID-19. The opportunity to ask questions was provided.

## 2019-12-01 ENCOUNTER — Ambulatory Visit (HOSPITAL_COMMUNITY): Payer: Medicare Other | Admitting: Anesthesiology

## 2019-12-01 ENCOUNTER — Observation Stay (HOSPITAL_COMMUNITY)
Admission: RE | Admit: 2019-12-01 | Discharge: 2019-12-02 | Disposition: A | Discharge status: Home Health | Payer: Medicare Other | Attending: Thoracic Surgery (Cardiothoracic Vascular Surgery) | Admitting: Thoracic Surgery (Cardiothoracic Vascular Surgery)

## 2019-12-01 ENCOUNTER — Encounter (HOSPITAL_COMMUNITY): Payer: Self-pay | Admitting: Thoracic Surgery (Cardiothoracic Vascular Surgery)

## 2019-12-01 ENCOUNTER — Encounter (HOSPITAL_COMMUNITY)
Admission: RE | Disposition: A | Discharge status: Home Health | Payer: Self-pay | Source: Home / Self Care | Attending: Thoracic Surgery (Cardiothoracic Vascular Surgery)

## 2019-12-01 ENCOUNTER — Other Ambulatory Visit: Payer: Self-pay

## 2019-12-01 DIAGNOSIS — M1990 Unspecified osteoarthritis, unspecified site: Secondary | ICD-10-CM | POA: Diagnosis not present

## 2019-12-01 DIAGNOSIS — J9382 Other air leak: Principal | ICD-10-CM | POA: Insufficient documentation

## 2019-12-01 DIAGNOSIS — J9381 Chronic pneumothorax: Secondary | ICD-10-CM | POA: Insufficient documentation

## 2019-12-01 DIAGNOSIS — Z87891 Personal history of nicotine dependence: Secondary | ICD-10-CM | POA: Insufficient documentation

## 2019-12-01 DIAGNOSIS — Z881 Allergy status to other antibiotic agents status: Secondary | ICD-10-CM | POA: Diagnosis not present

## 2019-12-01 DIAGNOSIS — Z9889 Other specified postprocedural states: Secondary | ICD-10-CM

## 2019-12-01 DIAGNOSIS — Z9689 Presence of other specified functional implants: Secondary | ICD-10-CM | POA: Diagnosis not present

## 2019-12-01 DIAGNOSIS — Z79899 Other long term (current) drug therapy: Secondary | ICD-10-CM | POA: Diagnosis not present

## 2019-12-01 DIAGNOSIS — Z880 Allergy status to penicillin: Secondary | ICD-10-CM | POA: Insufficient documentation

## 2019-12-01 DIAGNOSIS — J939 Pneumothorax, unspecified: Secondary | ICD-10-CM | POA: Diagnosis present

## 2019-12-01 DIAGNOSIS — IMO0002 Reserved for concepts with insufficient information to code with codable children: Secondary | ICD-10-CM

## 2019-12-01 DIAGNOSIS — Z978 Presence of other specified devices: Secondary | ICD-10-CM | POA: Insufficient documentation

## 2019-12-01 DIAGNOSIS — Z882 Allergy status to sulfonamides status: Secondary | ICD-10-CM | POA: Insufficient documentation

## 2019-12-01 DIAGNOSIS — J439 Emphysema, unspecified: Secondary | ICD-10-CM | POA: Insufficient documentation

## 2019-12-01 DIAGNOSIS — Z8551 Personal history of malignant neoplasm of bladder: Secondary | ICD-10-CM | POA: Insufficient documentation

## 2019-12-01 DIAGNOSIS — F0390 Unspecified dementia without behavioral disturbance: Secondary | ICD-10-CM | POA: Diagnosis not present

## 2019-12-01 DIAGNOSIS — Z885 Allergy status to narcotic agent status: Secondary | ICD-10-CM | POA: Diagnosis not present

## 2019-12-01 DIAGNOSIS — Z886 Allergy status to analgesic agent status: Secondary | ICD-10-CM | POA: Diagnosis not present

## 2019-12-01 DIAGNOSIS — Z8616 Personal history of COVID-19: Secondary | ICD-10-CM | POA: Insufficient documentation

## 2019-12-01 DIAGNOSIS — Z7951 Long term (current) use of inhaled steroids: Secondary | ICD-10-CM | POA: Diagnosis not present

## 2019-12-01 HISTORY — PX: VIDEO BRONCHOSCOPY WITH INSERTION OF INTERBRONCHIAL VALVE (IBV): SHX6178

## 2019-12-01 SURGERY — BRONCHOSCOPY, FLEXIBLE, WITH INTRABRONCHIAL VALVE INSERTION
Anesthesia: General

## 2019-12-01 MED ORDER — PROPOFOL 10 MG/ML IV BOLUS
INTRAVENOUS | Status: AC
  Filled 2019-12-01: qty 20

## 2019-12-01 MED ORDER — ALBUTEROL SULFATE (2.5 MG/3ML) 0.083% IN NEBU
2.5000 mg | INHALATION_SOLUTION | Freq: Four times a day (QID) | RESPIRATORY_TRACT | Status: DC | PRN
  Administered 2019-12-02: 2.5 mg via RESPIRATORY_TRACT
  Filled 2019-12-01: qty 3

## 2019-12-01 MED ORDER — FENTANYL CITRATE (PF) 250 MCG/5ML IJ SOLN
INTRAMUSCULAR | Status: AC
  Filled 2019-12-01: qty 5

## 2019-12-01 MED ORDER — OXYCODONE-ACETAMINOPHEN 5-325 MG PO TABS
1.0000 | ORAL_TABLET | Freq: Three times a day (TID) | ORAL | Status: DC | PRN
  Administered 2019-12-02: 1 via ORAL
  Filled 2019-12-01: qty 1

## 2019-12-01 MED ORDER — MOMETASONE FURO-FORMOTEROL FUM 100-5 MCG/ACT IN AERO
2.0000 | INHALATION_SPRAY | Freq: Two times a day (BID) | RESPIRATORY_TRACT | Status: DC
  Administered 2019-12-02: 10:00:00 2 via RESPIRATORY_TRACT
  Filled 2019-12-01: qty 8.8

## 2019-12-01 MED ORDER — LACTATED RINGERS IV SOLN: INTRAVENOUS | Status: DC

## 2019-12-01 MED ORDER — FENTANYL CITRATE (PF) 250 MCG/5ML IJ SOLN
INTRAMUSCULAR | Status: DC | PRN
  Administered 2019-12-01: 50 ug via INTRAVENOUS

## 2019-12-01 MED ORDER — SUGAMMADEX SODIUM 200 MG/2ML IV SOLN
INTRAVENOUS | Status: DC | PRN
  Administered 2019-12-01: 200 mg via INTRAVENOUS

## 2019-12-01 MED ORDER — IPRATROPIUM-ALBUTEROL 0.5-2.5 (3) MG/3ML IN SOLN
RESPIRATORY_TRACT | Status: AC
  Filled 2019-12-01: qty 3

## 2019-12-01 MED ORDER — ONDANSETRON HCL 4 MG/2ML IJ SOLN: 4.0000 mg | Freq: Once | INTRAMUSCULAR | Status: DC | PRN

## 2019-12-01 MED ORDER — PHENYLEPHRINE 40 MCG/ML (10ML) SYRINGE FOR IV PUSH (FOR BLOOD PRESSURE SUPPORT)
PREFILLED_SYRINGE | INTRAVENOUS | Status: DC | PRN
  Administered 2019-12-01: 40 ug via INTRAVENOUS
  Administered 2019-12-01: 80 ug via INTRAVENOUS

## 2019-12-01 MED ORDER — PHENYLEPHRINE 40 MCG/ML (10ML) SYRINGE FOR IV PUSH (FOR BLOOD PRESSURE SUPPORT)
PREFILLED_SYRINGE | INTRAVENOUS | Status: AC
  Filled 2019-12-01: qty 20

## 2019-12-01 MED ORDER — DEXAMETHASONE SODIUM PHOSPHATE 10 MG/ML IJ SOLN
INTRAMUSCULAR | Status: DC | PRN
  Administered 2019-12-01: 10 mg via INTRAVENOUS

## 2019-12-01 MED ORDER — FENTANYL CITRATE (PF) 100 MCG/2ML IJ SOLN: 25.0000 ug | INTRAMUSCULAR | Status: DC | PRN

## 2019-12-01 MED ORDER — LIDOCAINE 2% (20 MG/ML) 5 ML SYRINGE
INTRAMUSCULAR | Status: AC
  Filled 2019-12-01: qty 5

## 2019-12-01 MED ORDER — ALBUTEROL SULFATE HFA 108 (90 BASE) MCG/ACT IN AERS: 1.0000 | INHALATION_SPRAY | Freq: Four times a day (QID) | RESPIRATORY_TRACT | Status: DC | PRN

## 2019-12-01 MED ORDER — LIDOCAINE 2% (20 MG/ML) 5 ML SYRINGE
INTRAMUSCULAR | Status: DC | PRN
  Administered 2019-12-01: 80 mg via INTRAVENOUS

## 2019-12-01 MED ORDER — IPRATROPIUM-ALBUTEROL 0.5-2.5 (3) MG/3ML IN SOLN
3.0000 mL | Freq: Once | RESPIRATORY_TRACT | Status: AC
  Administered 2019-12-01: 3 mL via RESPIRATORY_TRACT

## 2019-12-01 MED ORDER — ONDANSETRON HCL 4 MG/2ML IJ SOLN
INTRAMUSCULAR | Status: AC
  Filled 2019-12-01: qty 2

## 2019-12-01 MED ORDER — ONDANSETRON HCL 4 MG/2ML IJ SOLN
INTRAMUSCULAR | Status: DC | PRN
  Administered 2019-12-01: 4 mg via INTRAVENOUS

## 2019-12-01 MED ORDER — DOCUSATE SODIUM 100 MG PO CAPS: 100.0000 mg | ORAL_CAPSULE | Freq: Every day | ORAL | Status: DC | PRN

## 2019-12-01 MED ORDER — ENOXAPARIN SODIUM 30 MG/0.3ML ~~LOC~~ SOLN
30.0000 mg | SUBCUTANEOUS | Status: DC
  Administered 2019-12-02: 30 mg via SUBCUTANEOUS
  Filled 2019-12-01: qty 0.3

## 2019-12-01 MED ORDER — ROCURONIUM BROMIDE 10 MG/ML (PF) SYRINGE
PREFILLED_SYRINGE | INTRAVENOUS | Status: DC | PRN
  Administered 2019-12-01: 40 mg via INTRAVENOUS

## 2019-12-01 MED ORDER — DEXAMETHASONE SODIUM PHOSPHATE 10 MG/ML IJ SOLN
INTRAMUSCULAR | Status: AC
  Filled 2019-12-01: qty 1

## 2019-12-01 MED ORDER — ASCORBIC ACID 500 MG PO TABS
500.0000 mg | ORAL_TABLET | Freq: Every day | ORAL | Status: DC
  Administered 2019-12-02: 500 mg via ORAL
  Filled 2019-12-01: qty 1

## 2019-12-01 MED ORDER — PROPOFOL 10 MG/ML IV BOLUS
INTRAVENOUS | Status: DC | PRN
  Administered 2019-12-01: 100 mg via INTRAVENOUS

## 2019-12-01 MED ORDER — VITAMIN C 500 MG PO CAPS: 500.0000 mg | ORAL_CAPSULE | Freq: Every day | ORAL | Status: DC

## 2019-12-01 MED ORDER — VITAMIN D 125 MCG (5000 UT) PO CAPS: 5000.0000 [IU] | ORAL_CAPSULE | Freq: Every day | ORAL | Status: DC

## 2019-12-01 MED ORDER — 0.9 % SODIUM CHLORIDE (POUR BTL) OPTIME
TOPICAL | Status: DC | PRN
  Administered 2019-12-01: 15:00:00 1000 mL

## 2019-12-01 SURGICAL SUPPLY — 41 items
ADAPTER VALVE BIOPSY EBUS (MISCELLANEOUS) IMPLANT
ADPTR VALVE BIOPSY EBUS (MISCELLANEOUS)
BLADE CLIPPER SURG (BLADE) ×3 IMPLANT
CANISTER SUCT 3000ML PPV (MISCELLANEOUS) ×3 IMPLANT
CATH BALLN 4FR (CATHETERS) ×2 IMPLANT
CATH EMB 5FR 80CM (CATHETERS) ×2 IMPLANT
CATH EMB 6FR 80CM (CATHETERS) ×2 IMPLANT
CNTNR URN SCR LID CUP LEK RST (MISCELLANEOUS) ×1 IMPLANT
CONT SPEC 4OZ STRL OR WHT (MISCELLANEOUS) ×3
COVER BACK TABLE 60X90IN (DRAPES) ×3 IMPLANT
FILTER STRAW FLUID ASPIR (MISCELLANEOUS) IMPLANT
FORCEPS BIOP RJ4 1.8 (CUTTING FORCEPS) IMPLANT
GAUZE SPONGE 4X4 12PLY STRL (GAUZE/BANDAGES/DRESSINGS) ×3 IMPLANT
GLOVE SURG SIGNA 7.5 PF LTX (GLOVE) ×3 IMPLANT
GOWN STRL REUS W/ TWL XL LVL3 (GOWN DISPOSABLE) ×1 IMPLANT
GOWN STRL REUS W/TWL XL LVL3 (GOWN DISPOSABLE) ×3
KIT AIRWAY SIZING HUD (KITS) ×2 IMPLANT
KIT CLEAN ENDO COMPLIANCE (KITS) ×3 IMPLANT
KIT TURNOVER KIT B (KITS) ×3 IMPLANT
MARKER SKIN DUAL TIP RULER LAB (MISCELLANEOUS) ×3 IMPLANT
NS IRRIG 1000ML POUR BTL (IV SOLUTION) ×3 IMPLANT
OIL SILICONE PENTAX (PARTS (SERVICE/REPAIRS)) IMPLANT
PAD ARMBOARD 7.5X6 YLW CONV (MISCELLANEOUS) ×6 IMPLANT
STOPCOCK 4 WAY LG BORE MALE ST (IV SETS) ×2 IMPLANT
STOPCOCK MORSE 400PSI 3WAY (MISCELLANEOUS) ×3 IMPLANT
SYR 10ML LL (SYRINGE) ×3 IMPLANT
SYR 20ML ECCENTRIC (SYRINGE) ×3 IMPLANT
SYR 3ML LL SCALE MARK (SYRINGE) ×2 IMPLANT
TOWEL GREEN STERILE (TOWEL DISPOSABLE) ×3 IMPLANT
TOWEL GREEN STERILE FF (TOWEL DISPOSABLE) ×3 IMPLANT
TOWEL NATURAL 4PK STERILE (DISPOSABLE) ×3 IMPLANT
TRAP SPECIMEN MUCOUS 40CC (MISCELLANEOUS) ×3 IMPLANT
TUBE CONNECTING 20'X1/4 (TUBING) ×2
TUBE CONNECTING 20X1/4 (TUBING) ×3 IMPLANT
UNDERPAD 30X30 (UNDERPADS AND DIAPERS) ×3 IMPLANT
VALVE BIOPSY  SINGLE USE (MISCELLANEOUS) ×3
VALVE BIOPSY SINGLE USE (MISCELLANEOUS) ×1 IMPLANT
VALVE IN CARTRIDGE 7MM HUD (Valve) ×4 IMPLANT
VALVE IN CARTRIDGE 9MM HUD (Valve) ×4 IMPLANT
VALVE SUCTION BRONCHIO DISP (MISCELLANEOUS) ×3 IMPLANT
WATER STERILE IRR 1000ML POUR (IV SOLUTION) ×3 IMPLANT

## 2019-12-01 NOTE — Anesthesia Preprocedure Evaluation (Signed)
Anesthesia Evaluation  Patient identified by MRN, date of birth, ID band Patient awake    Reviewed: Allergy & Precautions, NPO status , Patient's Chart, lab work & pertinent test results  Airway Mallampati: II  TM Distance: >3 FB Neck ROM: Full    Dental no notable dental hx.    Pulmonary COPD, former smoker,  H/O spontaneous pneumothorax   Pulmonary exam normal breath sounds clear to auscultation + decreased breath sounds      Cardiovascular negative cardio ROS Normal cardiovascular exam Rhythm:Regular Rate:Normal     Neuro/Psych negative psych ROS   GI/Hepatic negative GI ROS, Neg liver ROS,   Endo/Other  negative endocrine ROS  Renal/GU negative Renal ROS  negative genitourinary   Musculoskeletal negative musculoskeletal ROS (+)   Abdominal   Peds negative pediatric ROS (+)  Hematology negative hematology ROS (+)   Anesthesia Other Findings   Reproductive/Obstetrics negative OB ROS                             Anesthesia Physical Anesthesia Plan  ASA: III  Anesthesia Plan: General   Post-op Pain Management:    Induction: Intravenous  PONV Risk Score and Plan: 3 and Ondansetron, Dexamethasone and Treatment may vary due to age or medical condition  Airway Management Planned: Oral ETT  Additional Equipment:   Intra-op Plan:   Post-operative Plan: Extubation in OR  Informed Consent: I have reviewed the patients History and Physical, chart, labs and discussed the procedure including the risks, benefits and alternatives for the proposed anesthesia with the patient or authorized representative who has indicated his/her understanding and acceptance.     Dental advisory given  Plan Discussed with: CRNA and Surgeon  Anesthesia Plan Comments:         Anesthesia Quick Evaluation

## 2019-12-01 NOTE — Transfer of Care (Signed)
Immediate Anesthesia Transfer of Care Note  Patient: Amanda [name]  Procedure(s) Performed: VIDEO BRONCHOSCOPY WITH INSERTION OF INTERBRONCHIAL VALVE (IBV) (N/A )  Patient Location: PACU  Anesthesia Type:General  Level of Consciousness: drowsy and patient cooperative  Airway & Oxygen Therapy: Patient Spontanous Breathing and Patient connected to nasal cannula oxygen  Post-op Assessment: Report given to RN and Post -op Vital signs reviewed and stable  Post vital signs: Reviewed and stable  Last Vitals:  Vitals Value Taken Time  BP 111/65 12/01/19 1536  Temp 36.2 C 12/01/19 1535  Pulse 103 12/01/19 1536  Resp 14 12/01/19 1536  SpO2 85 % 12/01/19 1536  Vitals shown include unvalidated device data.  Last Pain:  Vitals:   12/01/19 0946  TempSrc:   PainSc: 3       Patients Stated Pain Goal: 0 ([id number])  Complications: No apparent anesthesia complications

## 2019-12-01 NOTE — Anesthesia Postprocedure Evaluation (Signed)
Anesthesia Post Note  Patient: Amanda [name]  Procedure(s) Performed: VIDEO BRONCHOSCOPY WITH INSERTION OF INTERBRONCHIAL VALVE (IBV) (N/A )     Patient location during evaluation: PACU Anesthesia Type: General Level of consciousness: awake and alert Pain management: pain level controlled Vital Signs Assessment: post-procedure vital signs reviewed and stable Respiratory status: spontaneous breathing, nonlabored ventilation, respiratory function stable and patient connected to nasal cannula oxygen Cardiovascular status: blood pressure returned to baseline and stable Postop Assessment: no apparent nausea or vomiting Anesthetic complications: no    Last Vitals:  Vitals:   12/01/19 0932 12/01/19 1535  BP: 131/79 111/65  Pulse:  (!) 103  Resp: 18 14  Temp: 36.6 C (!) 36.2 C  SpO2: 100% 90%    Last Pain:  Vitals:   12/01/19 0946  TempSrc:   PainSc: 3                  Ekta Dancer S

## 2019-12-01 NOTE — Op Note (Signed)
NAME: Oglesby, Brinson [id number] ACCOUNT [id number] DATE OF BIRTH:1941-03-28 FACILITY: [facility] LOCATION: [facility] PHYSICIAN:Bryne Lindon C. Dajuan Turnley, MD  OPERATIVE REPORT  DATE OF PROCEDURE:  12/01/2019  PREOPERATIVE DIAGNOSIS:  Right pneumothorax with persistent air leak.  POSTOPERATIVE DIAGNOSIS:  Right pneumothorax with persistent air leak.  PROCEDURE:  Bronchoscopy with placement of intrabronchial valves.  SURGEON:  Modesto Charon, MD  ASSISTANT:  None.  ANESTHESIA:  General.  FINDINGS:  Air leak only resolved with completely blocking off the lower lobe.  CLINICAL NOTE:  [name] is a 79 year old woman with end-stage COPD who presented with a spontaneous pneumothorax.  A chest tube was placed.  She had an ongoing air leak and a small space apically.  She was not felt to be a surgical candidate.   Intrabronchial valves were placed in the upper lobe and middle lobe and the air leak improved, but it never completely resolved.  Over time, the air leak again worsened back to its baseline.  She now presents for placement of additional intrabronchial  valves.  The indications, risks, benefits, and alternatives were discussed in detail with the patient.  She understood and accepted the risks and agreed to proceed.  OPERATIVE NOTE:  The patient was brought to the operating room on 12/01/2019.  She had induction of general anesthesia and was intubated.  A timeout was performed.  Flexible fiberoptic bronchoscopy was performed via the endotracheal tube.  There was  normal endobronchial anatomy.  There were some thick clear secretions.  The previously placed right upper lobe and right middle lobe valves all appeared to be well seated.  A Fogarty balloon catheter was inserted and inflation in the bronchus intermedius  resulted in complete resolution of the air leak.  Occlusion of the superior segmental bronchus had no demonstrable effect on the air leak, nor did occluding any  of the basilar segmental bronchi or the complete basilar segmental grouping.  The decision was  made to place valves to completely occlude the lower lobe.  The airways were sized.  The superior segmental bronchus sized for a 9 mm valve and that was deployed without difficulty.  There was a slight angle, but it did appear to have good apposition to  the airways.  Next, the central basilar segmental grouping of bronchi was occluded with a 9 mm valve.  That valve deployed in good orientation.  The anterior and medial segmental bronchi then were separately occluded using 7 mm valves.  In both cases  those valves deployed nicely with good alignment.  With suction applied, there was still a leak through the tube, but when suction was removed, the leak resolved.  Final inspection was made.  All the valves appeared to be in good position.  The  bronchoscope then was withdrawn.  The patient was extubated in the operating room and taken to the postanesthetic care unit in good condition.  VN/NUANCE  D:12/01/2019 T:12/01/2019 JOB:[id number]

## 2019-12-01 NOTE — Progress Notes (Signed)
Upon assessing the pt, this RN noticed (along with pt's daughter/caregiver) that the air leak was still presenting in the first chamber of the pleuravac. A page to [facility] was sent. RN reported to MD that air leak was present, even with the pleuravac with water seal. No suction was applied, but the leak persisted. Roxan Hockey stated it will be assessed upon rounds. If anything else needs to be addressed, call the on-call MD for his office instead of his pager.

## 2019-12-01 NOTE — Anesthesia Procedure Notes (Signed)
Procedure Name: Intubation Date/Time: 12/01/2019 2:37 PM Performed by: Moshe Salisbury, CRNA Pre-anesthesia Checklist: Patient identified, Emergency Drugs available, Suction available and Patient being monitored Patient Re-evaluated:Patient Re-evaluated prior to induction Oxygen Delivery Method: Circle System Utilized Preoxygenation: Pre-oxygenation with 100% oxygen Induction Type: IV induction Ventilation: Mask ventilation without difficulty Laryngoscope Size: Mac and 3 Grade View: Grade I Tube type: Oral Tube size: 8.5 mm Number of attempts: 1 Airway Equipment and Method: Stylet Placement Confirmation: ETT inserted through vocal cords under direct vision,  positive ETCO2 and breath sounds checked- equal and bilateral Secured at: 20 cm Tube secured with: Tape Dental Injury: Teeth and Oropharynx as per pre-operative assessment

## 2019-12-01 NOTE — Brief Op Note (Signed)
12/01/2019  3:33 PM  PATIENT:  Amanda [name]  79 y.o. female  PRE-OPERATIVE DIAGNOSIS:  PERSISTENT AIR LEAK  POST-OPERATIVE DIAGNOSIS:  PERSISTENT AIR LEAK  PROCEDURE:  Procedure(s): VIDEO BRONCHOSCOPY WITH INSERTION OF INTERBRONCHIAL VALVE (IBV) (N/A)  SURGEON:  Surgeon(s) and Role:    * [name], MD - Primary  PHYSICIAN ASSISTANT:   ASSISTANTS: none   ANESTHESIA:   general  EBL:  none  BLOOD ADMINISTERED:none  DRAINS: none   LOCAL MEDICATIONS USED:  NONE  SPECIMEN:  No Specimen  DISPOSITION OF SPECIMEN:  N/A  COUNTS:  NO N/A  TOURNIQUET:  * No tourniquets in log *  DICTATION: .Other Dictation: Dictation Number -  PLAN OF CARE: Admit for overnight observation  PATIENT DISPOSITION:  PACU - hemodynamically stable.   Delay start of Pharmacological VTE agent (>24hrs) due to surgical blood loss or risk of bleeding: no

## 2019-12-01 NOTE — Interval H&P Note (Signed)
History and Physical Interval Note:  12/01/2019 1:47 PM  Amanda [name]  has presented today for surgery, with the diagnosis of PERSISANT AIR LEAK.  The various methods of treatment have been discussed with the patient and family. After consideration of risks, benefits and other options for treatment, the patient has consented to  Procedure(s): VIDEO BRONCHOSCOPY WITH INSERTION OF INTERBRONCHIAL VALVE (IBV) (N/A) as a surgical intervention.  The patient's history has been reviewed, patient examined, no change in status, stable for surgery.  I have reviewed the patient's chart and labs.  Questions were answered to the patient's satisfaction.     [name]

## 2019-12-02 ENCOUNTER — Encounter: Payer: Self-pay | Admitting: *Deleted

## 2019-12-02 ENCOUNTER — Other Ambulatory Visit: Payer: Self-pay | Admitting: Thoracic Surgery (Cardiothoracic Vascular Surgery)

## 2019-12-02 ENCOUNTER — Observation Stay (HOSPITAL_COMMUNITY): Payer: Medicare Other

## 2019-12-02 DIAGNOSIS — J9383 Other pneumothorax: Secondary | ICD-10-CM

## 2019-12-02 DIAGNOSIS — Z9889 Other specified postprocedural states: Secondary | ICD-10-CM

## 2019-12-02 DIAGNOSIS — J9382 Other air leak: Secondary | ICD-10-CM | POA: Diagnosis not present

## 2019-12-02 NOTE — Discharge Instructions (Signed)
Discharge Instructions:  1. Please resume home diet, activity level 2. Please continue current chest tube care 3. Contact office should issues arise at [phone]

## 2019-12-02 NOTE — Discharge Summary (Signed)
Physician Discharge Summary  Patient ID: Amanda [name] MRN: [id number] DOB/AGE: 03-21-1941 79 y.o.  Admit date: 12/01/2019 Discharge date: 12/02/2019  Admission Diagnoses:  Patient Active Problem List   Diagnosis Date Noted  . Pneumothorax on right 12/01/2019  . Hypokalemia 09/30/2019  . Recurrent spontaneous pneumothorax 09/29/2019  . Emphysema/COPD ([place]) 07/06/2018  . Protein-calorie malnutrition, severe 06/25/2018  . Dementia ([place]) 06/22/2018  . SOB (shortness of breath) 06/21/2018  . Spontaneous pneumothorax 06/21/2018   Discharge Diagnoses:   Patient Active Problem List   Diagnosis Date Noted  . S/P Bronchoscopy with placement of IBV  12/02/2019  . Pneumothorax on right 12/01/2019  . Hypokalemia 09/30/2019  . Recurrent spontaneous pneumothorax 09/29/2019  . 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 Protein-calorie malnutrition, severe 06/25/2018  . Dementia ([place]) 06/22/2018  . SOB (shortness of breath) 06/21/2018  . Spontaneous pneumothorax 06/21/2018   Discharged Condition: good  History of Present Illness:   Mrs. Hasse is a 79 yo female with history of tobacco abuse, end stage COPD, Covid 19 fall of 2020, bladder cancer, arthritis, seizures, and recurrent right pneumothoraces.  She developed a spontaneous pneumothorax on New Years Eve.  This was treated with chest tube and complete re-expansion of the lung.  During the hospitalization the chest tube became dislodged.  She required repeat chest tube placement and her lung never fully expanded.  She also had a persistent air leak.  Due to not being a surgical candidate she underwent off label placement of IBV.  These were placed in the middle and upper lobes.  Initially the leak improved, but unfortunately it got larger over time.  The patient was discharged home with chest tube in place.  She has been routinely followed by Dr. Roxan Hockey weekly since that time.  There has been no improvement in her CXR or air leak since discharge.   She continues to experience shortness of breath with minimal activity.  She does participate with physical therapy.  She presented back to Dr. Roxan Hockey who stated she may require surgical intervention which would most certainly be high risk.  She did not wish to proceed with that option.  She was agreeable however to place and additional IBV to see if this can help improve her air leak.  The risks and benefits of the procedure were explained to the patient and she was agreeable to proceed.  Hospital Course:   Mrs. Donehoo presented to [facility] on 12/01/2019.  She was taken to the operating room and underwent Bronchoscopy with placement on intrabronchial valves.  She tolerated the procedure without difficulty and was taken to the PACU in stable condition.  Post operative CXR shows a continued right pneumothorax.  There did not appear to be an air leak from her chest tube.  She is not experiencing pain.  She has home PT and RN that have been in use prior to admission.  She is felt to be medically stable for discharge home today.  Treatments: surgery:   PREOPERATIVE DIAGNOSIS:  Right pneumothorax with persistent air leak.  POSTOPERATIVE DIAGNOSIS:  Right pneumothorax with persistent air leak.  PROCEDURE:  Bronchoscopy with placement of intrabronchial valves.  Discharge Exam: Blood pressure (!) 103/54, pulse (!) 104, temperature 97.7 F (36.5 C), temperature source Oral, resp. rate 19, height 5\' 5"  (1.651 m), weight 44.6 kg, SpO2 97 %.  General appearance: alert, cooperative and no distress Heart: regular rate and rhythm Lungs: clear to auscultation bilaterally and + auditory wheeze from air crossing valve Wound: clean  and dry  Discharge Medications:   Allergies as of 12/02/2019      Reactions   Sulfa Antibiotics Shortness Of Breath   Ciprofloxacin Other (See Comments)   GI pain, headache. Pt states she had while she was in hospital (IV) and was ok    Lactose Other (See Comments)    Intolerant per patient   Levofloxacin    Hallucinations   Sulfamethoxazole Other (See Comments)   Dizziness   Aspirin Nausea And Vomiting   Cephalexin Rash   Clindamycin Rash   Erythromycin Base Rash   Hydromorphone Rash, Other (See Comments)   Rash on back, redness   Penicillins Rash      Medication List    TAKE these medications   acetaminophen 500 MG tablet Commonly known as: TYLENOL Take 500-1,000 mg by mouth daily as needed for mild pain or moderate pain.   albuterol 108 (90 Base) MCG/ACT inhaler Commonly known as: VENTOLIN HFA Inhale 1-2 puffs into the lungs every 6 (six) hours as needed for wheezing or shortness of breath.   docusate sodium 100 MG capsule Commonly known as: COLACE Take 1 capsule (100 mg total) by mouth 2 (two) times daily. What changed:   when to take this  reasons to take this   oxyCODONE-acetaminophen 5-325 MG tablet Commonly known as: PERCOCET/ROXICET Take 1 tablet by mouth every 8 (eight) hours as needed for severe pain.   Symbicort 80-4.5 MCG/ACT inhaler Generic drug: budesonide-formoterol Inhale 2 puffs into the lungs 2 (two) times daily.   Vitamin C 500 MG Caps Take 500 mg by mouth daily.   Vitamin D 125 MCG (5000 UT) Caps Take 5,000 Units by mouth daily.   ZINC-C-B6 MT Take 1 tablet by mouth daily.      Follow-up Information    [name], MD Follow up on 12/06/2019.   Specialty: Cardiothoracic Surgery Why: Appointment is at 1:00, please get CXR at 12:30 at [facility] located on first floor of our office building  Contact information: [address] [place] [address] [phone]           Signed:  Ellwood Handler, PA-C 12/02/2019, 9:36 AM

## 2019-12-02 NOTE — Progress Notes (Signed)
Patient is ready for discharge. Discharge instructions have been reviewed with patient and all questions answered. IV has been removed with no complication, patient tolerated well.  Patient has been removed from telemetry, [place] notified. Patient's daughter is here with her and will transport her home. Patient has all personal belongings and supplies.

## 2019-12-02 NOTE — TOC Transition Note (Signed)
Transition of Care [facility]) - CM/SW Discharge Note   Patient Details  Name: Amanda [name] MRN: [id number] Date of Birth: Oct 13, 1940  [facility] CM/SW Contact:  Zenon Mayo, RN Phone Number: 12/02/2019, 9:55 AM   Clinical Narrative:    NCM spoke with Staff RN, she asked patient what the agency name was who was coming out to see her she could not remember ,she states for NCM to call her daughter , Lynelle Smoke.  NCM contacted [name] she states it is [facility] and patient is getting HHRN, HHPT.  They would like to continue with Alvis Lemmings , she really likes her Nurse.  NCM confirmed with Tommi Rumps and informed him that she is for dc today.NCM informed PA to add HPT to orders. Soc will begin 24 to 48 hrs post dc.   Final next level of care: [place] Barriers to Discharge: No Barriers Identified   Patient Goals and CMS Choice Patient states their goals for this hospitalization and ongoing recovery are:: go home CMS Medicare.gov Compare Post Acute Care list provided to:: Patient Represenative (must comment)([name])    Discharge Placement                       Discharge Plan and Services                DME Arranged: (NA)         HH Arranged: RN, PT HH Agency: [place] Date [facility] Agency Contacted: 12/02/19 Time [place]: [phone] Representative spoke with at [place]: [place] ([place]) Interventions     Readmission Risk Interventions No flowsheet data found.

## 2019-12-02 NOTE — Progress Notes (Signed)
      [address]       [place] [address]             [phone]      1 Day Post-Op Procedure(s) (LRB): VIDEO BRONCHOSCOPY WITH INSERTION OF INTERBRONCHIAL VALVE (IBV) (N/A)   Subjective:  Doing okay, no specific complaints.   Objective: Vital signs in last 24 hours: Temp:  [97 F (36.1 C)-98 F (36.7 C)] 98 F (36.7 C) (03/05 0316) Pulse Rate:  [80-121] 80 (03/05 0316) Cardiac Rhythm: Normal sinus rhythm (03/04 2230) Resp:  [14-29] 21 (03/05 0316) BP: (94-131)/(47-79) 94/47 (03/05 0316) SpO2:  [90 %-100 %] 97 % (03/05 0601) Weight:  [42.3 kg-44.6 kg] 44.6 kg (03/04 2337)  Intake/Output from previous day: 03/04 0701 - 03/05 0700 In: 835.2 [I.V.:835.2] Out: 175 [Urine:175]  General appearance: alert, cooperative and no distress Heart: regular rate and rhythm Lungs: clear to auscultation bilaterally and + auditory wheeze from air crossing valve Wound: clean and dry  Lab Results: Recent Labs    11/29/19 1225  WBC 8.0  HGB 12.6  HCT 42.0  PLT 278   BMET:  Recent Labs    11/29/19 1225  NA 142  K 4.5  CL 103  CO2 27  GLUCOSE 121*  BUN 22  CREATININE 0.78  CALCIUM 10.5*    PT/INR:  Recent Labs    11/29/19 1225  LABPROT 12.8  INR 1.0   ABG No results found for: PHART, HCO3, TCO2, ACIDBASEDEF, O2SAT CBG (last 3)  No results for input(s): GLUCAP in the last 72 hours.  Assessment/Plan: S/P Procedure(s) (LRB): VIDEO BRONCHOSCOPY WITH INSERTION OF INTERBRONCHIAL VALVE (IBV) (N/A)  1. CV- NSR 2. Pulm- S/P IBV, some tidaling in pleurovac, no definitive air leak.. Continued space on CXR 3. Lovenox for DVT prophylaxis 4. Dispo- patient stable, chest tube to [facility], per patient has home health already, will ensure proper home health is in place   LOS: 0 days    Ellwood Handler, PA-C  12/02/2019

## 2019-12-06 ENCOUNTER — Other Ambulatory Visit: Payer: Self-pay

## 2019-12-06 ENCOUNTER — Encounter: Payer: Self-pay | Admitting: Thoracic Surgery (Cardiothoracic Vascular Surgery)

## 2019-12-06 ENCOUNTER — Ambulatory Visit (INDEPENDENT_AMBULATORY_CARE_PROVIDER_SITE_OTHER): Payer: Medicare Other | Admitting: Thoracic Surgery (Cardiothoracic Vascular Surgery)

## 2019-12-06 ENCOUNTER — Ambulatory Visit
Admission: RE | Admit: 2019-12-06 | Discharge: 2019-12-06 | Disposition: A | Discharge status: Home / Self Care | Payer: Medicare Other | Source: Ambulatory Visit | Attending: Thoracic Surgery (Cardiothoracic Vascular Surgery) | Admitting: Thoracic Surgery (Cardiothoracic Vascular Surgery)

## 2019-12-06 VITALS — BP 119/65 | HR 114 | Temp 97.3°F | Resp 16 | Ht 65.0 in | Wt 91.4 lb

## 2019-12-06 DIAGNOSIS — J9383 Other pneumothorax: Secondary | ICD-10-CM

## 2019-12-06 DIAGNOSIS — J939 Pneumothorax, unspecified: Secondary | ICD-10-CM | POA: Diagnosis not present

## 2019-12-06 DIAGNOSIS — Z9689 Presence of other specified functional implants: Secondary | ICD-10-CM

## 2019-12-06 DIAGNOSIS — IMO0002 Reserved for concepts with insufficient information to code with codable children: Secondary | ICD-10-CM

## 2019-12-06 DIAGNOSIS — J9382 Other air leak: Secondary | ICD-10-CM | POA: Diagnosis not present

## 2019-12-06 NOTE — Progress Notes (Signed)
[address]       [place] [address]             [phone]       HPI: Amanda [name] returns for follow-up  Amanda [name] is a 79 year old woman with end-stage COPD, history of tobacco abuse, COVID-19 in the fall 2020, arthritis, seizures, and recurrent right pneumothoraces.  She presented with a recurrent right pneumothorax on New Year's Eve.  A tube was placed and her lung reexpanded completely initially but during her hospitalization the tube was dislodged and after a new tube was placed she never had complete reexpansion again.  She is a poor surgical candidate and did not want to undergo surgery so we used off label IBV's in the upper and middle lobes.  The leak improved initially but over time it increased likely due to cross ventilation from the lower lobe.  After a prolonged period of trying to talk her into having additional valves placed versus surgery, she finally agreed to have additional IBV placed.  I placed IBV blocking off the lower lobe on 12/01/2019.  She was discharged the following day.  She continues to feel shortness of breath.  Her respiratory status is about the same as it was prior to the procedure.  She gets short of breath with minimal activity.  Her saturation sometimes dropped into the high 80s but her daughter is able to get him up into the low 90s by having her take deep breaths.  Past Medical History:  Diagnosis Date  . Arthritis   . Bladder cancer ([place])   . Blood transfusion without reported diagnosis   . COPD (chronic obstructive pulmonary disease) ([place])   . COVID-19 07/2019  . Dyspnea   . History of kidney stones   . Pneumonia   . Renal disorder    kidney stones  . Seizures ([place])   . 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 acetaminophen (TYLENOL) 500 MG tablet Take 500-1,000 mg by mouth daily as needed for mild pain or moderate pain.     Marland Kitchen  albuterol (PROVENTIL HFA;VENTOLIN HFA) 108 (90 Base) MCG/ACT inhaler Inhale 1-2 puffs into the lungs every 6 (six) hours as needed for wheezing or shortness of breath. 1 Inhaler 0  . Ascorbic Acid (VITAMIN C) 500 MG CAPS Take 500 mg by mouth daily.     . Cholecalciferol (VITAMIN D) 125 MCG (5000 UT) CAPS Take 5,000 Units by mouth daily.    Marland Kitchen docusate sodium (COLACE) 100 MG capsule Take 1 capsule (100 mg total) by mouth 2 (two) times daily. (Patient taking differently: Take 100 mg by mouth daily as needed for moderate constipation. ) 10 capsule 0  . oxyCODONE-acetaminophen (PERCOCET/ROXICET) 5-325 MG tablet Take 1 tablet by mouth every 8 (eight) hours as needed for severe pain.     . SYMBICORT 80-4.5 MCG/ACT inhaler Inhale 2 puffs into the lungs 2 (two) times daily.    Marland Kitchen ZINC-C-B6 MT Take 1 tablet by mouth daily.     No current facility-administered medications for this visit.    Physical Exam BP 119/65 (BP Location: Left Arm, Patient Position: Sitting, Cuff Size: Small)   Pulse (!) 114   Temp (!) 97.3 F (36.3 C)   Resp 16   Ht 5\' 5"  (1.651 m)   Wt 91 lb 6.4 oz (41.5 kg)   SpO2 95% Comment: on 3L O2  BMI 15.21 kg/m  Frail 79 year old in no acute distress On 3 L nasal cannula Positive air leak  Diagnostic Tests: CHEST - 2 VIEW  COMPARISON:  December 02, 2019  FINDINGS: The right-sided pneumothorax is similar in size in the interval measuring up to 4 cm in thickness in the lateral apex. The right chest tube remains in place. The heart, hila, mediastinum, lungs, and pleura are otherwise unchanged.  IMPRESSION: Stable right chest tube.  Stable right apical pneumothorax.   Electronically Signed   By: Dorise Bullion III M.D   On: 12/06/2019 13:22 I personally reviewed the chest x-ray images and concur with the findings noted above.  Impression: Amanda [name] is a 79 year old woman with a history of tobacco abuse and end-stage COPD who has had recurrent right spontaneous  pneumothoraces.  Her most recent one was about 2 months ago.  She has had a tube in place since then.  We have tried blocking off her airways with intrabronchial valves.  There is still air in the lung and she still has an air leak.  The air leak definitely is smaller than it was previously but is not completely resolved.  I again had a discussion with the family that this is a difficult clinical situation with no good answers.  The only definitive treatment is surgery and she would be extremely high risk for that.  They have stated on multiple occasions and continued to state that they have no interest in having a surgical procedure.  I think the only reasonable option at this point is to continue to give this some additional time to see if that leak will eventually heal, but there is no guarantee that it will.  Plan: Return in 1 week with PA lateral chest x-ray  [name], MD [facility] [phone]

## 2019-12-13 ENCOUNTER — Encounter: Payer: Self-pay | Admitting: Thoracic Surgery (Cardiothoracic Vascular Surgery)

## 2019-12-20 ENCOUNTER — Ambulatory Visit
Admission: RE | Admit: 2019-12-20 | Discharge: 2019-12-20 | Disposition: A | Discharge status: Home / Self Care | Payer: Medicare Other | Source: Ambulatory Visit | Attending: Thoracic Surgery (Cardiothoracic Vascular Surgery) | Admitting: Thoracic Surgery (Cardiothoracic Vascular Surgery)

## 2019-12-20 ENCOUNTER — Ambulatory Visit (INDEPENDENT_AMBULATORY_CARE_PROVIDER_SITE_OTHER): Payer: Medicare Other | Admitting: Thoracic Surgery (Cardiothoracic Vascular Surgery)

## 2019-12-20 ENCOUNTER — Encounter: Payer: Self-pay | Admitting: Thoracic Surgery (Cardiothoracic Vascular Surgery)

## 2019-12-20 ENCOUNTER — Encounter: Payer: Self-pay | Admitting: *Deleted

## 2019-12-20 ENCOUNTER — Other Ambulatory Visit: Payer: Self-pay

## 2019-12-20 VITALS — BP 108/58 | HR 100 | Temp 97.2°F | Resp 16 | Ht 65.0 in

## 2019-12-20 DIAGNOSIS — J9382 Other air leak: Secondary | ICD-10-CM

## 2019-12-20 DIAGNOSIS — J939 Pneumothorax, unspecified: Secondary | ICD-10-CM | POA: Diagnosis not present

## 2019-12-20 DIAGNOSIS — J9383 Other pneumothorax: Secondary | ICD-10-CM

## 2019-12-20 DIAGNOSIS — Z09 Encounter for follow-up examination after completed treatment for conditions other than malignant neoplasm: Secondary | ICD-10-CM | POA: Diagnosis not present

## 2019-12-20 DIAGNOSIS — IMO0002 Reserved for concepts with insufficient information to code with codable children: Secondary | ICD-10-CM

## 2019-12-20 NOTE — Progress Notes (Addendum)
[address]       [place] [address]             [phone]       HPI: [name] returns for scheduled follow-up visit  Amanda [name] is a 79 year old woman with a history of tobacco abuse and severe COPD.  She has had recurrent right pneumothoraces.  She presented back on New Year's Eve with a right spontaneous pneumothorax.  A tube was placed and her lung reexpanded.  During her hospitalization the tube became dislodged.  A new tube was placed but the lung never completely reexpanded again and she has had a persistent air leak ever since.  We placed right upper lobe and right middle lobe intrabronchial valves during that hospitalization.  Her air leak improved initially but then worsened again.  On 12/01/2019 I took her back to the OR and placed multiple additional IBV's essentially blocking off all of the airways.  She missed her appointment last week.  She says her breathing is about the same.  She stills feels short of breath.  Her sister says that sometimes they check her oxygen levels and they are 94 to 95% but she still feels short of breath.  She has some mild pain at the tube site.  Past Medical History:  Diagnosis Date  . Arthritis   . Bladder cancer ([place])   . Blood transfusion without reported diagnosis   . COPD (chronic obstructive pulmonary disease) ([place])   . COVID-19 07/2019  . Dyspnea   . History of kidney stones   . Pneumonia   . Renal disorder    kidney stones  . Seizures ([place])   . 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 acetaminophen (TYLENOL) 500 MG tablet Take 500-1,000 mg by mouth daily as needed for mild pain or moderate pain.     Marland Kitchen albuterol (PROVENTIL HFA;VENTOLIN HFA) 108 (90 Base) MCG/ACT inhaler Inhale 1-2 puffs into the lungs every 6 (six) hours as needed for wheezing or shortness of breath. 1 Inhaler 0  . Ascorbic Acid (VITAMIN C) 500 MG CAPS  Take 500 mg by mouth daily.     . Cholecalciferol (VITAMIN D) 125 MCG (5000 UT) CAPS Take 5,000 Units by mouth daily.    Marland Kitchen docusate sodium (COLACE) 100 MG capsule Take 1 capsule (100 mg total) by mouth 2 (two) times daily. (Patient taking differently: Take 100 mg by mouth daily as needed for moderate constipation. ) 10 capsule 0  . oxyCODONE-acetaminophen (PERCOCET/ROXICET) 5-325 MG tablet Take 1 tablet by mouth every 8 (eight) hours as needed for severe pain.     . SYMBICORT 80-4.5 MCG/ACT inhaler Inhale 2 puffs into the lungs 2 (two) times daily.    Marland Kitchen ZINC-C-B6 MT Take 1 tablet by mouth daily.     No current facility-administered medications for this visit.    Physical Exam BP (!) 108/58 (BP Location: Left Arm, Patient Position: Sitting, Cuff Size: Small)   Pulse 100   Temp (!) 97.2 F (36.2 C)   Resp 16   Ht 5\' 5"  (1.651 m)   SpO2 98% Comment: ON 3L 02 CONT.  BMI 15.88 kg/m  79 year old woman in no acute distress Frail-appearing, on oxygen Lungs with diminished breath sounds on right, moderate expiratory wheezing on left Positive air leak  Diagnostic Tests: CHEST - 2 VIEW  COMPARISON:  12/06/2019  FINDINGS: Pigtail chest  tube right upper lobe unchanged in position. Right pneumothorax approximately 4 cm in size also unchanged. There is extensive scarring in the right upper lobe. Endobronchial valves are present in the right upper and right lower lobe.  COPD with diffuse scarring. Extensive pleuroparenchymal scarring in the left apex. Left lower lobe scarring noted. No effusion or heart failure. Atherosclerotic aorta.  IMPRESSION: Moderately large right apical pneumothorax appears loculated and unchanged. Multiple endobronchial valves on the right. Right chest tube remains in place.   Electronically Signed   By: Franchot Gallo M.D.   On: 12/20/2019 13:18  I personally reviewed the chest x-ray and concur with the findings noted above  Impression: Amanda [name] is a 79 year old woman with history of tobacco abuse and severe COPD and recurrent right spontaneous pneumothoraces.  She presented with a right pneumothorax on New Year's Eve.  So she is now almost 3 months out with a persistent air leak.  We have tried intrabronchial valves and although her air leak is smaller than it was initially there is still a leak.  There is been no improvement in the chest x-ray.  I am not optimistic that the leak will stop.  I explained to the patient and her family that she is likely to need surgical intervention.  The other option would be to try a talc pleurodesis.  However, if we do that we probably burned any bridges in terms of surgical correction.  The other option is to continue with a chest tube indefinitely.  They are very concerned about surgery and the high risk nature of the procedure.  They need some time to think over her options.  Plan: Return in 1 week with PA and lateral chest x-ray  Amanda Nakayama, MD [facility] [phone]  Amanda [name] has severe COPD and is short of breath even at rest without oxygen.  She requires oxygen at 3 L/min to maintain oxygen saturation of greater than 90% at rest.  She requires additional oxygen with any activity.  Amanda [name] Amanda Hockey, MD [facility] [phone]

## 2019-12-20 NOTE — Progress Notes (Signed)
Qualifying criteria for oxygen:                       12/06/19                            Oxygen Sat at rest on RA                    90%                           Oxygen Sat on exertion on RA             86%                         Oxygen Sat at rest on 3L O2               95%

## 2019-12-26 ENCOUNTER — Other Ambulatory Visit: Payer: Self-pay | Admitting: Thoracic Surgery (Cardiothoracic Vascular Surgery)

## 2019-12-26 DIAGNOSIS — J939 Pneumothorax, unspecified: Secondary | ICD-10-CM

## 2019-12-27 ENCOUNTER — Other Ambulatory Visit: Payer: Self-pay

## 2019-12-27 ENCOUNTER — Ambulatory Visit (INDEPENDENT_AMBULATORY_CARE_PROVIDER_SITE_OTHER): Payer: Medicare Other | Admitting: Thoracic Surgery (Cardiothoracic Vascular Surgery)

## 2019-12-27 ENCOUNTER — Ambulatory Visit
Admission: RE | Admit: 2019-12-27 | Discharge: 2019-12-27 | Disposition: A | Discharge status: Home / Self Care | Payer: Medicare Other | Source: Ambulatory Visit | Attending: Thoracic Surgery (Cardiothoracic Vascular Surgery) | Admitting: Thoracic Surgery (Cardiothoracic Vascular Surgery)

## 2019-12-27 VITALS — BP 102/62 | HR 102 | Temp 98.2°F | Resp 24 | Ht 65.0 in | Wt 92.8 lb

## 2019-12-27 DIAGNOSIS — J9312 Secondary spontaneous pneumothorax: Secondary | ICD-10-CM

## 2019-12-27 DIAGNOSIS — J939 Pneumothorax, unspecified: Secondary | ICD-10-CM

## 2019-12-27 NOTE — Progress Notes (Signed)
[address]       [place] [address]             [phone]       HPI: Amanda [name] returns for scheduled visit  Amanda [name] returns again to further discuss her ongoing air leak.  She continues to use oxygen at home.  She is on 3 L nasal cannula at rest.  She remains very weak.  She can only take a few steps before having to sit down to rest.  Past Medical History:  Diagnosis Date  . Arthritis   . Bladder cancer ([place])   . Blood transfusion without reported diagnosis   . COPD (chronic obstructive pulmonary disease) ([place])   . COVID-19 07/2019  . Dyspnea   . History of kidney stones   . Pneumonia   . Renal disorder    kidney stones  . Seizures ([place])   . 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 acetaminophen (TYLENOL) 500 MG tablet Take 500-1,000 mg by mouth daily as needed for mild pain or moderate pain.     Marland Kitchen albuterol (PROVENTIL HFA;VENTOLIN HFA) 108 (90 Base) MCG/ACT inhaler Inhale 1-2 puffs into the lungs every 6 (six) hours as needed for wheezing or shortness of breath. 1 Inhaler 0  . Ascorbic Acid (VITAMIN C) 500 MG CAPS Take 500 mg by mouth daily.     . Cholecalciferol (VITAMIN D) 125 MCG (5000 UT) CAPS Take 5,000 Units by mouth daily.    Marland Kitchen docusate sodium (COLACE) 100 MG capsule Take 1 capsule (100 mg total) by mouth 2 (two) times daily. (Patient taking differently: Take 100 mg by mouth daily as needed for moderate constipation. ) 10 capsule 0  . oxyCODONE-acetaminophen (PERCOCET/ROXICET) 5-325 MG tablet Take 1 tablet by mouth every 8 (eight) hours as needed for severe pain.     . SYMBICORT 80-4.5 MCG/ACT inhaler Inhale 2 puffs into the lungs 2 (two) times daily.    Marland Kitchen ZINC-C-B6 MT Take 1 tablet by mouth daily.     No current facility-administered medications for this visit.    Physical Exam BP 102/62 (BP Location: Left Arm, Patient Position: Sitting, Cuff Size:  Normal)   Pulse (!) 102   Temp 98.2 F (36.8 C) (Temporal)   Resp (!) 24   Ht 5\' 5"  (1.651 m)   Wt 92 lb 12.8 oz (42.1 kg)   SpO2 98% Comment: RA  BMI 15.44 kg/m  Elderly frail woman in no acute distress, nasal cannula oxygen present Tremor Alert and oriented x3, anxious Lungs diminished breath sounds bilaterally no wheezing + Air leak  Diagnostic Tests: Chest x-ray unchanged  Impression: Amanda [name] is a 79 year old woman with history of tobacco abuse and end-stage COPD.  She has had multiple right pneumothoraces.  She had a right spontaneous pneumothorax on 09/29/2019.  A chest tube was placed.  She had an ongoing leak.  At one time the tube became dislodged and had to be replaced.  Her lung never completely reexpanded after that event.  She refused any surgical intervention and was not a particularly good surgical candidate.  We tried placing valves to block off the upper and middle lobes while she was hospitalized.  That seemed to help a little initially but then her leak worsened again.  We placed additional IBV's on 12/01/2019 which again showed some initial slight improvement but over time her leak  worsened further.  I had the similar conversation with Amanda [name] and her family that we have had for the few visits.  Her air leak persist despite IBV's essentially blocking off the whole right lung.  Obviously there is some leakage around one or more of the valves, but at this point I do not think that that is going to be successful.  She has had a tube in for 3 months now with an ongoing leak.  I think the only thing that really has a significant chance of success at this point would be surgical intervention.  Even that is worrisome given the severity of her COPD.  She is concerned that she is too weak for surgery.  Unfortunately, I do not see that changing it anytime in the near future.  In the meantime she remains at risk should the to become dislodged.  I offered the option of contacting  hospice for end-of-life type issues, which upset her and her family significantly.  I do think her life expectancy is limited due to the severity of her COPD but not the chest tube per se.  I think the best option in her case would be to do a limited right axillary thoracotomy and try to repair the air leak either by stapling or glue.  Obviously, she would be an extremely high risk patient.  We discussed the option of attempting talc installation.  I am not sure that would be successful and it might further complicate issues down the road.  Once again Amanda [name] expresses reluctance to have any intervention.  She continues to claim to believe that this will stop on its own despite evidence to the contrary.  Plan: She will think over her options and return in 2 weeks.  She will call if she wishes to proceed sooner.  [name], MD [facility] [phone]

## 2019-12-28 ENCOUNTER — Encounter: Payer: Self-pay | Admitting: *Deleted

## 2019-12-28 NOTE — Progress Notes (Signed)
Mrs. Merchen daughter called with concerns that the normally clear drainage from her mother's chest tube has changed.  She noticed some yellow and "milky" drainage today, only very small amounts.  I notified Dr. Roxan Hockey. As before and already explained to  Mrs. Bentivegna and her daughters, she needs surgery.  Otherwise, they are to notify us if she becomes febrile.  She understands/

## 2020-01-01 ENCOUNTER — Emergency Department (HOSPITAL_COMMUNITY)
Admission: EM | Admit: 2020-01-01 | Discharge: 2020-01-02 | Disposition: A | Discharge status: Home / Self Care | Payer: Medicare HMO | Attending: Emergency Medicine | Admitting: Emergency Medicine

## 2020-01-01 ENCOUNTER — Emergency Department (HOSPITAL_COMMUNITY): Payer: Medicare HMO

## 2020-01-01 ENCOUNTER — Encounter (HOSPITAL_COMMUNITY): Payer: Self-pay

## 2020-01-01 ENCOUNTER — Other Ambulatory Visit: Payer: Self-pay

## 2020-01-01 DIAGNOSIS — Z8616 Personal history of COVID-19: Secondary | ICD-10-CM | POA: Diagnosis not present

## 2020-01-01 DIAGNOSIS — D75839 Thrombocytosis, unspecified: Secondary | ICD-10-CM

## 2020-01-01 DIAGNOSIS — D649 Anemia, unspecified: Secondary | ICD-10-CM

## 2020-01-01 DIAGNOSIS — J9381 Chronic pneumothorax: Secondary | ICD-10-CM | POA: Insufficient documentation

## 2020-01-01 DIAGNOSIS — J441 Chronic obstructive pulmonary disease with (acute) exacerbation: Secondary | ICD-10-CM | POA: Diagnosis not present

## 2020-01-01 DIAGNOSIS — D7589 Other specified diseases of blood and blood-forming organs: Secondary | ICD-10-CM | POA: Insufficient documentation

## 2020-01-01 DIAGNOSIS — D6489 Other specified anemias: Secondary | ICD-10-CM | POA: Insufficient documentation

## 2020-01-01 DIAGNOSIS — R0602 Shortness of breath: Secondary | ICD-10-CM | POA: Diagnosis present

## 2020-01-01 DIAGNOSIS — Z79899 Other long term (current) drug therapy: Secondary | ICD-10-CM | POA: Insufficient documentation

## 2020-01-01 DIAGNOSIS — D473 Essential (hemorrhagic) thrombocythemia: Secondary | ICD-10-CM

## 2020-01-01 NOTE — ED Triage Notes (Signed)
C/o increase  In shortness of breath.  Is wandering if pleura vac needs to come out.  Pt is on oxygen @ 4L via [place], states she usually is on 3L oxygen.  Respirations labored.

## 2020-01-02 LAB — CBC
HCT: 35.5 % — ABNORMAL LOW (ref 36.0–46.0)
Hemoglobin: 11.3 g/dL — ABNORMAL LOW (ref 12.0–15.0)
MCH: 28.5 pg (ref 26.0–34.0)
MCHC: 31.8 g/dL (ref 30.0–36.0)
MCV: 89.4 fL (ref 80.0–100.0)
Platelets: 406 10*3/uL — ABNORMAL HIGH (ref 150–400)
RBC: 3.97 MIL/uL (ref 3.87–5.11)
RDW: 14.8 % (ref 11.5–15.5)
WBC: 9.4 10*3/uL (ref 4.0–10.5)
nRBC: 0 % (ref 0.0–0.2)

## 2020-01-02 LAB — BASIC METABOLIC PANEL
Anion gap: 11 (ref 5–15)
BUN: 27 mg/dL — ABNORMAL HIGH (ref 8–23)
CO2: 26 mmol/L (ref 22–32)
Calcium: 9.9 mg/dL (ref 8.9–10.3)
Chloride: 100 mmol/L (ref 98–111)
Creatinine, Ser: 0.83 mg/dL (ref 0.44–1.00)
GFR calc Af Amer: >60 mL/min (ref >60)
GFR calc non Af Amer: >60 mL/min (ref >60)
Glucose, Bld: 124 mg/dL — ABNORMAL HIGH (ref 70–99)
Potassium: 4.1 mmol/L (ref 3.5–5.1)
Sodium: 137 mmol/L (ref 135–145)

## 2020-01-02 MED ORDER — IPRATROPIUM-ALBUTEROL 0.5-2.5 (3) MG/3ML IN SOLN
3.0000 mL | Freq: Once | RESPIRATORY_TRACT | Status: AC
  Administered 2020-01-02: 3 mL via RESPIRATORY_TRACT
  Filled 2020-01-02: qty 9

## 2020-01-02 MED ORDER — PREDNISONE 20 MG PO TABS
60.0000 mg | ORAL_TABLET | Freq: Once | ORAL | Status: AC
  Administered 2020-01-02: 60 mg via ORAL
  Filled 2020-01-02: qty 3

## 2020-01-02 MED ORDER — IPRATROPIUM-ALBUTEROL 0.5-2.5 (3) MG/3ML IN SOLN
3.0000 mL | Freq: Once | RESPIRATORY_TRACT | Status: AC
  Administered 2020-01-02: 3 mL via RESPIRATORY_TRACT
  Filled 2020-01-02: qty 3

## 2020-01-02 MED ORDER — PREDNISONE 50 MG PO TABS: 50.0000 mg | ORAL_TABLET | Freq: Every day | ORAL | 0 refills | Status: DC

## 2020-01-02 NOTE — ED Notes (Signed)
Patient verbalizes understanding of discharge instructions. Opportunity for questioning and answers were provided. Armband removed by staff, pt discharged from ED via wheelchair.  

## 2020-01-02 NOTE — ED Notes (Addendum)
Pt very SOB despite increased O2. Pt has very labored breathing. Pt is becoming inpatient claiming she can't wait all night.  Was able to convince pt to stay despite her wanting to leave.

## 2020-01-02 NOTE — Discharge Instructions (Addendum)
Use your home nebulizer every four hours for the next two days, then resume your previous schedule.  Return if you are having any problems.

## 2020-01-02 NOTE — ED Notes (Signed)
Pt began to complain of SOB. Rechecked vitals with SPO2 at 96%. Switched out empty O2 tank with full O2 tank to see if SOB improves.

## 2020-01-02 NOTE — ED Provider Notes (Signed)
[facility] EMERGENCY DEPARTMENT Provider Note   CSN: [id number] Arrival date & time: 01/01/20  2253   History Chief Complaint  Patient presents with  . Shortness of Breath    Amanda [name] is a 79 y.o. female.  The history is provided by the patient.  Shortness of Breath She has history of COPD with persistent right pneumothorax and was brought in by family with complaints of increasing shortness of breath.  When I am talking to the patient, she actually states that her breathing is at her baseline.  However, she states that he did cough up a large amount of mucus today without any change in her respiratory status.  She is asking if it is time to have her chest tube taken out.  She denies fever or chills.  She does have nebulizer and inhaler at home and did use her nebulizer earlier today but has not used it this evening.  Past Medical History:  Diagnosis Date  . Arthritis   . Bladder cancer ([place])   . Blood transfusion without reported diagnosis   . COPD (chronic obstructive pulmonary disease) ([place])   . COVID-19 07/2019  . Dyspnea   . History of kidney stones   . Pneumonia   . Renal disorder    kidney stones  . Seizures ([place])   . 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 S/P Bronchoscopy with placement of IBV  12/02/2019  . Pneumothorax on right 12/01/2019  . Hypokalemia 09/30/2019  . Recurrent spontaneous pneumothorax 09/29/2019  . 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 Protein-calorie malnutrition, severe 06/25/2018  . Dementia ([place]) 06/22/2018  . SOB (shortness of breath) 06/21/2018  . Spontaneous pneumothorax 06/21/2018    Past Surgical History:  Procedure Laterality Date  . ABDOMINAL HYSTERECTOMY    . CYSTECTOMY W/ URETEROILEAL CONDUIT  2009  . DILATION AND CURETTAGE OF UTERUS    . VIDEO BRONCHOSCOPY WITH INSERTION OF INTERBRONCHIAL VALVE (IBV) N/A 10/12/2019   Procedure:  VIDEO BRONCHOSCOPY WITH INSERTION OF INTERBRONCHIAL VALVES (IBV)  TIMES THREE RIGHT UPPER LOBE , ONE MIDDLE LOBE.;  Surgeon: [name], MD;  Location: [facility] OR;  Service: Thoracic;  Laterality: N/A;  . VIDEO BRONCHOSCOPY WITH INSERTION OF INTERBRONCHIAL VALVE (IBV) N/A 12/01/2019   Procedure: VIDEO BRONCHOSCOPY WITH INSERTION OF INTERBRONCHIAL VALVE (IBV);  Surgeon: [name], MD;  Location: [facility] OR;  Service: Thoracic;  Laterality: N/A;     OB History   No obstetric history on file.     History reviewed. No pertinent family history.  Social History   Tobacco Use  . Smoking status: Former Smoker    Types: Cigarettes    Quit date: 2018    Years since quitting: 3.2  . Smokeless tobacco: Never Used  Substance Use Topics  . Alcohol use: Never  . Drug use: Never    Home Medications Prior to Admission medications   Medication Sig Start Date End Date Taking? Authorizing Provider  acetaminophen (TYLENOL) 500 MG tablet Take 500-1,000 mg by mouth daily as needed for mild pain or moderate pain.     [provider]  albuterol (PROVENTIL HFA;VENTOLIN HFA) 108 (90 Base) MCG/ACT inhaler Inhale 1-2 puffs into the lungs every 6 (six) hours as needed for wheezing or shortness of breath. 09/29/18   Hayden Rasmussen, MD  Ascorbic Acid (VITAMIN C) 500 MG CAPS Take 500 mg by mouth daily.  [provider]  Cholecalciferol (VITAMIN D) 125 MCG (5000 UT) CAPS Take 5,000 Units by mouth daily.    [provider]  docusate sodium (COLACE) 100 MG capsule Take 1 capsule (100 mg total) by mouth 2 (two) times daily. Patient taking differently: Take 100 mg by mouth daily as needed for moderate constipation.  10/19/19   Geradine Girt, DO  oxyCODONE-acetaminophen (PERCOCET/ROXICET) 5-325 MG tablet Take 1 tablet by mouth every 8 (eight) hours as needed for severe pain.  09/05/19   [provider]  SYMBICORT 80-4.5 MCG/ACT inhaler Inhale 2 puffs into the lungs 2  (two) times daily. 09/05/19   [provider]  ZINC-C-B6 MT Take 1 tablet by mouth daily.    [provider]    Allergies    Sulfa antibiotics, Ciprofloxacin, Lactose, Levofloxacin, Sulfamethoxazole, Aspirin, Cephalexin, Clindamycin, Erythromycin base, Hydromorphone, and Penicillins  Review of Systems   Review of Systems  Respiratory: Positive for shortness of breath.   All other systems reviewed and are negative.   Physical Exam Updated Vital Signs BP (!) 147/77 (BP Location: Right Arm)   Pulse (!) 110   Temp 98.1 F (36.7 C) (Oral)   Resp (!) 29   SpO2 97%   Physical Exam Vitals and nursing note reviewed.   Somewhat frail-appearing 79 year old female, resting comfortably and in no acute distress. Vital signs are significant for elevated respiratory rate and heart rate and blood pressure. Oxygen saturation is 97%, which is normal.  Breathing is slightly labored. Head is normocephalic and atraumatic. PERRLA, EOMI. Oropharynx is clear. Neck is nontender and supple without adenopathy or JVD. Back is nontender and there is no CVA tenderness. Lungs have diminished breath sounds with diffuse inspiratory and expiratory wheezes.  Rales are present at the right base. Chest is nontender.  Chest tube is in place on the right. Heart has regular rate and rhythm without murmur. Abdomen is soft, flat, nontender without masses or hepatosplenomegaly and peristalsis is normoactive. Extremities have no cyanosis or edema, full range of motion is present. Skin is warm and dry without rash. Neurologic: Mental status is normal, cranial nerves are intact, there are no motor or sensory deficits.  ED Results / Procedures / Treatments   Labs (all labs ordered are listed, but only abnormal results are displayed) Labs Reviewed  CBC - Abnormal; Notable for the following components:      Result Value   Hemoglobin 11.3 (*)    HCT 35.5 (*)    Platelets 406 (*)    All other components  within normal limits  BASIC METABOLIC PANEL - Abnormal; Notable for the following components:   Glucose, Bld 124 (*)    BUN 27 (*)    All other components within normal limits   Radiology DG Chest 2 View  Result Date: 01/01/2020 CLINICAL DATA:  Shortness of breath EXAM: CHEST - 2 VIEW COMPARISON:  12/27/2019 FINDINGS: Severe COPD. Right chest tube in place with re-expansion of the right lung. Probable small residual right apical pneumothorax. Heart is normal size. Chronic interstitial changes within the lungs. No definite visible effusions or acute bony abnormality. IMPRESSION: Severe COPD. Right chest tube in place with re-expansion of the right lung. Small residual right apical pneumothorax. Electronically Signed   By: Rolm Baptise M.D.   On: 01/01/2020 23:44    Procedures Procedures   Medications Ordered in ED Medications  ipratropium-albuterol (DUONEB) 0.5-2.5 (3) MG/3ML nebulizer solution 3 mL (3 mLs Nebulization Given 01/02/20 0136)  predniSONE (  DELTASONE) tablet 60 mg (60 mg Oral Given 01/02/20 0136)  ipratropium-albuterol (DUONEB) 0.5-2.5 (3) MG/3ML nebulizer solution 3 mL (3 mLs Nebulization Given 01/02/20 0305)    ED Course  I have reviewed the triage vital signs and the nursing notes.  Pertinent labs & imaging results that were available during my care of the patient were reviewed by me and considered in my medical decision making (see chart for details).  Dyspnea which appears to be exacerbation of underlying COPD.  History of recurrent pneumothorax with chest tube in place.  X-ray shows small residual apical pneumothorax, no infiltrates.  Labs show mild anemia and mild thrombocytosis, both of which are within the range she has been in recently.  Old records are reviewed showing chest tube placed for right-sided pneumothorax with maintaining chest tube secondary to persistent air leak.  Lung seems adequately expanded on x-ray today, so I believe her dyspnea is related to underlying  COPD.  She will be given steroids and nebulizer treatments with albuterol and ipratropium.  She noted significant improvement with above-noted treatment.  On exam, wheezing was markedly decreased.  She is given a second nebulizer treatment with further improvement.  Her daughter has arrived and stated that the chest tube has stopped bubbling.  I mentioned need for short course of prednisone, and she stated that her cardiothoracic surgeon had told her not to take prednisone.  I did discuss the case with Dr. Roxan Hockey of cardiothoracic surgery who stated that there is no contraindication to prednisone and that I should treat as medically appropriate.  Also, no need to move scheduled appointment for 2 weeks from now closer.  She is discharged with prescription for prednisone and told to use her home nebulizer aggressively.  Return precautions discussed.  MDM Rules/Calculators/A& P  Final Clinical Impression(s) / ED Diagnoses Final diagnoses:  COPD exacerbation ([place])  Normochromic normocytic anemia  Thrombocytosis ([place])    Rx / DC Orders ED Discharge Orders         Ordered    predniSONE (DELTASONE) 50 MG tablet  Daily     01/02/20 [id number]           Delora Fuel, MD [id number] [phone]

## 2020-01-09 ENCOUNTER — Other Ambulatory Visit: Payer: Self-pay | Admitting: Thoracic Surgery (Cardiothoracic Vascular Surgery)

## 2020-01-09 DIAGNOSIS — J939 Pneumothorax, unspecified: Secondary | ICD-10-CM

## 2020-01-10 ENCOUNTER — Other Ambulatory Visit: Payer: Self-pay

## 2020-01-10 ENCOUNTER — Encounter: Payer: Self-pay | Admitting: Thoracic Surgery (Cardiothoracic Vascular Surgery)

## 2020-01-10 ENCOUNTER — Ambulatory Visit (INDEPENDENT_AMBULATORY_CARE_PROVIDER_SITE_OTHER): Payer: Self-pay | Admitting: Thoracic Surgery (Cardiothoracic Vascular Surgery)

## 2020-01-10 ENCOUNTER — Ambulatory Visit
Admission: RE | Admit: 2020-01-10 | Discharge: 2020-01-10 | Disposition: A | Discharge status: Home / Self Care | Payer: Medicare HMO | Source: Ambulatory Visit | Attending: Thoracic Surgery (Cardiothoracic Vascular Surgery) | Admitting: Thoracic Surgery (Cardiothoracic Vascular Surgery)

## 2020-01-10 VITALS — BP 96/55 | HR 105 | Temp 97.7°F | Resp 18 | Ht 65.0 in

## 2020-01-10 DIAGNOSIS — J9312 Secondary spontaneous pneumothorax: Secondary | ICD-10-CM

## 2020-01-10 DIAGNOSIS — Z9689 Presence of other specified functional implants: Secondary | ICD-10-CM

## 2020-01-10 DIAGNOSIS — J9382 Other air leak: Secondary | ICD-10-CM

## 2020-01-10 DIAGNOSIS — J939 Pneumothorax, unspecified: Secondary | ICD-10-CM

## 2020-01-10 DIAGNOSIS — Z09 Encounter for follow-up examination after completed treatment for conditions other than malignant neoplasm: Secondary | ICD-10-CM

## 2020-01-10 DIAGNOSIS — IMO0002 Reserved for concepts with insufficient information to code with codable children: Secondary | ICD-10-CM

## 2020-01-10 NOTE — Progress Notes (Addendum)
[address]       [place] [address]             [phone]     HPI: Amanda [name] returns for follow-up of her ongoing air leak.  Amanda [name] is a 79 year old woman with a history of tobacco abuse, COPD, protein calorie malnutrition and recurrent right spontaneous pneumothoraces.  She had her current right spontaneous pneumothorax on New Year's Eve 2020.  She had a pigtail placed and initially had complete reexpansion of the lung.  During her hospitalization the pigtail was inadvertently removed and her lung dropped.  A new pigtail was placed but the lung never completely reexpanded again.  We placed intrabronchial valves blocking off the upper and middle lobes.  Unfortunately she had a continued air leak after that so we went ahead and placed valves in the lower lobe as well.  I last saw her on 12/27/2019.  She still had a large air leak.  In the interim since her last visit her daughter noticed that the tube was no longer bubbling.  She was not in any distress.  A chest x-ray was done which showed decrease in size of the pneumothorax/space.  Past Medical History:  Diagnosis Date  . Arthritis   . Bladder cancer ([place])   . Blood transfusion without reported diagnosis   . COPD (chronic obstructive pulmonary disease) ([place])   . COVID-19 07/2019  . Dyspnea   . History of kidney stones   . Pneumonia   . Renal disorder    kidney stones  . Seizures ([place])   . 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 acetaminophen (TYLENOL) 500 MG tablet Take 500-1,000 mg by mouth daily as needed for mild pain or moderate pain.     Marland Kitchen albuterol (PROVENTIL HFA;VENTOLIN HFA) 108 (90 Base) MCG/ACT inhaler Inhale 1-2 puffs into the lungs every 6 (six) hours as needed for wheezing or shortness of breath. 1 Inhaler 0  . Ascorbic Acid (VITAMIN C) 500 MG CAPS Take 500 mg by mouth daily.     . Cholecalciferol  (VITAMIN D) 125 MCG (5000 UT) CAPS Take 5,000 Units by mouth daily.    Marland Kitchen docusate sodium (COLACE) 100 MG capsule Take 1 capsule (100 mg total) by mouth 2 (two) times daily. (Patient taking differently: Take 100 mg by mouth daily as needed for moderate constipation. ) 10 capsule 0  . oxyCODONE-acetaminophen (PERCOCET/ROXICET) 5-325 MG tablet Take 1 tablet by mouth every 8 (eight) hours as needed for severe pain.     . predniSONE (DELTASONE) 50 MG tablet Take 1 tablet (50 mg total) by mouth daily. 5 tablet 0  . SYMBICORT 80-4.5 MCG/ACT inhaler Inhale 2 puffs into the lungs 2 (two) times daily.    Marland Kitchen ZINC-C-B6 MT Take 1 tablet by mouth daily.     No current facility-administered medications for this visit.    Physical Exam BP (!) 96/55 (BP Location: Left Arm, Patient Position: Sitting, Cuff Size: Normal)   Pulse (!) 105   Temp 97.7 F (36.5 C)   Resp 18   Ht 5\' 5"  (1.651 m)   SpO2 97% Comment: RA  BMI 15.51 kg/m  79 year old woman in no acute distress Cachectic Lungs with wheezes bilaterally, diminished breath sounds right greater than left Small air leak with cough  Diagnostic Tests: CHEST - 2 VIEW  COMPARISON:  01/01/2020  FINDINGS: Normal heart  size. Atherosclerotic calcification of the aortic knob. Chronic bronchitic and emphysematous changes to both lungs. Multiple right-sided endobronchial valves again noted. Similar appearance of small right apical pneumothorax. Pigtail right pleural drainage catheter remains in place. No pleural effusion. Chronic left pleuroparenchymal thickening.  IMPRESSION: Stable small right apical pneumothorax. Pigtail right pleural drainage catheter remains in place.  These results will be called to the ordering clinician or representative by the Radiologist Assistant, and communication documented in the PACS or [employer].   Electronically Signed   By: Davina Poke D.O.   On: 01/10/2020 15:21 I reviewed the chest x-ray  images.  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  She has had a chest tube and dating back to New Year's Eve of 2020 which is 4-1/2 months at this point.  We did place intrabronchial valves and of essentially occluded the entire right bronchial tree.  Despite that she had continuing air leak up until about 10 days ago when her daughter noticed that there were no longer bubbles coming through the tube.  On exam today there are still some faint breath sounds on the right and there is a small air leak with cough.  There is no air leak with baseline respirations in the air leak with cough is only intermittent.  This is a dramatic improvement from 3 weeks ago.  However, we still cannot take the tube out at this point.  Continue with chest tube drainage.  Plan: Return in 3 weeks with PA and lateral chest x-ray  [name], MD [facility] [phone]   Oxygen saturation on room air at rest 88% O2 saturation on 3L [place] 95%  Remo Lipps C. Roxan Hockey, MD [facility] [phone]

## 2020-01-11 ENCOUNTER — Encounter: Payer: Self-pay | Admitting: *Deleted

## 2020-01-17 ENCOUNTER — Telehealth: Payer: Self-pay

## 2020-01-17 NOTE — Telephone Encounter (Signed)
-----   Message from [name], MD sent at 01/17/2020  4:56 PM EDT ----- Regarding: RE: requesting Prednisone RX That is a huge dose of prednisone only for very short term use. Would be best for her to contact her primary  [facility] ----- Message ----- From: Marylen Ponto, LPN Sent: [id number]   4:02 PM EDT To: [name], MD Subject: requesting Prednisone RX                       Amanda [name]'s daughter/ Amanda [name] called requesting a RX refill on Prednisone 50 mg It was originally written by ED/MD-Dr Delora Fuel.  Given 5 tabs during ED visit on 01/02/20. Amanda Hands daughter states that this med really seems to help her breathing./improve. I told her I would ask  ... please advise  Amanda [name]

## 2020-01-30 ENCOUNTER — Other Ambulatory Visit: Payer: Self-pay | Admitting: Thoracic Surgery (Cardiothoracic Vascular Surgery)

## 2020-01-30 DIAGNOSIS — J9383 Other pneumothorax: Secondary | ICD-10-CM

## 2020-01-31 ENCOUNTER — Other Ambulatory Visit: Payer: Self-pay | Admitting: *Deleted

## 2020-01-31 ENCOUNTER — Ambulatory Visit
Admission: RE | Admit: 2020-01-31 | Discharge: 2020-01-31 | Disposition: A | Discharge status: Home / Self Care | Payer: Medicare HMO | Source: Ambulatory Visit | Attending: Thoracic Surgery (Cardiothoracic Vascular Surgery) | Admitting: Thoracic Surgery (Cardiothoracic Vascular Surgery)

## 2020-01-31 ENCOUNTER — Encounter: Payer: Self-pay | Admitting: Thoracic Surgery (Cardiothoracic Vascular Surgery)

## 2020-01-31 ENCOUNTER — Other Ambulatory Visit: Payer: Self-pay

## 2020-01-31 ENCOUNTER — Ambulatory Visit (INDEPENDENT_AMBULATORY_CARE_PROVIDER_SITE_OTHER): Payer: Medicare HMO | Admitting: Thoracic Surgery (Cardiothoracic Vascular Surgery)

## 2020-01-31 VITALS — BP 107/63 | HR 107 | Temp 97.5°F | Resp 18 | Ht 65.0 in | Wt 87.0 lb

## 2020-01-31 DIAGNOSIS — J9383 Other pneumothorax: Secondary | ICD-10-CM

## 2020-01-31 DIAGNOSIS — J939 Pneumothorax, unspecified: Secondary | ICD-10-CM

## 2020-01-31 MED ORDER — PREDNISONE 10 MG PO TABS: 10.0000 mg | ORAL_TABLET | Freq: Every day | ORAL | 0 refills | Status: DC

## 2020-01-31 NOTE — H&P (View-Only) (Signed)
[address]       [place] [address]             [phone]     HPI: [name] returns for scheduled follow-up visit  Amanda [name] is a 79 year old woman with tobacco abuse, severe COPD, protein calorie malnutrition, and recurrent right spontaneous pneumothoraces.  She presented with a right pneumothorax on New Year's Eve 2020.  She previously had recurrent ones on that side year to a year and a half earlier.  She had a prolonged air leak but refused surgery.  She was felt to be a poor surgical candidate anyway.  We blocked off the upper and middle lobes with intrabronchial valves, but after initially improving her air leak worsened again.  We later went back and placed valves in the lower lobes as well.  She refused more aggressive surgical intervention multiple times.  I saw her on 01/10/2020.  Her air leak had decreased dramatically from her prior visit 2 weeks earlier.  Her chest x-ray showed some slight improvement as well.  She has been feeling short of breath.  Her daughter says that on the 15th the bubbles stopped completely and she has not seen any evidence of a leak since then.  She has been feeling short of breath and has been having a lot of wheezing.  She was on a 5-day course of 50 mg of prednisone.  She is requesting a lower dose.  Past Medical History:  Diagnosis Date  . Arthritis   . Bladder cancer ([place])   . Blood transfusion without reported diagnosis   . COPD (chronic obstructive pulmonary disease) ([place])   . COVID-19 07/2019  . Dyspnea   . History of kidney stones   . Pneumonia   . Renal disorder    kidney stones  . Seizures ([place])   . 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 acetaminophen (TYLENOL) 500 MG tablet Take 500-1,000 mg by mouth daily as needed for mild pain or moderate pain.     Marland Kitchen albuterol (PROVENTIL HFA;VENTOLIN HFA) 108 (90 Base) MCG/ACT  inhaler Inhale 1-2 puffs into the lungs every 6 (six) hours as needed for wheezing or shortness of breath. 1 Inhaler 0  . Ascorbic Acid (VITAMIN C) 500 MG CAPS Take 500 mg by mouth daily.     . Cholecalciferol (VITAMIN D) 125 MCG (5000 UT) CAPS Take 5,000 Units by mouth daily.    Marland Kitchen docusate sodium (COLACE) 100 MG capsule Take 1 capsule (100 mg total) by mouth 2 (two) times daily. (Patient taking differently: Take 100 mg by mouth daily as needed for moderate constipation. ) 10 capsule 0  . oxyCODONE-acetaminophen (PERCOCET/ROXICET) 5-325 MG tablet Take 1 tablet by mouth every 8 (eight) hours as needed for severe pain.     . SYMBICORT 80-4.5 MCG/ACT inhaler Inhale 2 puffs into the lungs 2 (two) times daily.    Marland Kitchen ZINC-C-B6 MT Take 1 tablet by mouth daily.    . predniSONE (DELTASONE) 10 MG tablet Take 1 tablet (10 mg total) by mouth daily with breakfast. 10 tablet 0   No current facility-administered medications for this visit.    Physical Exam BP 107/63 (BP Location: Right Arm, Patient Position: Sitting, Cuff Size: Small)   Pulse (!) 107   Temp (!) 97.5 F (36.4 C)   Resp 18   Ht 5\' 5"  (1.651 m)   Wt  87 lb (39.5 kg)   SpO2 96% Comment: 4LNC  BMI 14.48 kg/m  Frail 79 year old woman in no acute distress Nasal cannula oxygen in place Alert and oriented x3 Lungs with wheezing bilaterally, diminished but equal breath sounds Cardiac mildly tachycardic regular No air leak  Diagnostic Tests: CHEST - 2 VIEW  COMPARISON:  01/10/2020.  FINDINGS: Chest tube noted in stable position. Interval resolution of right-sided pneumothorax. Endobronchial valves again noted. Bilateral pulmonary interstitial changes again noted. Stable bilateral pleural thickening consistent scarring. Heart size normal. Degenerative changes scoliosis thoracic spine.  IMPRESSION: Chest tube noted in stable position. Interval resolution of right-sided pneumothorax.   Electronically Signed   By: Marcello Moores   Register   On: 01/31/2020 12:49 I personally reviewed the chest x-ray images and concur with the findings noted above.  Impression: Amanda [name] is a 79 year old woman with a history of tobacco abuse and severe COPD.  She was admitted with a recurrent spontaneous pneumothorax on New Year's Eve.  A chest tube was placed.  She has had a prolonged air leak since then.  Intrabronchial valves in the upper and middle lobes helped initially, but then her air leak worsened again, probably because of cross ventilation.  I placed lower lobe valves on 12/01/2019.  Her air leak persisted after that but when I saw her on 01/10/2020 her air leak was markedly improved.  Today there is no air leak at all.  Per her daughter the air leak stopped about 2 weeks ago.  On chest x-ray the pneumothorax is completely resolved there is some pleural thickening in the area hopefully indicative of some scarring between the lung and the chest wall.  I discussed the option of intrabronchial valve removal and chest tube removal with Mrs. Wake and her sister and daughter.  Also discussed the possibility of talc pleurodesis.  We talked about the advantages and disadvantages and the risks of talc pleurodesis.  I think given her overall frail condition if she were to have any reaction to the talc that could potentially be fatal in her case.  Hopefully she has developed enough scarring from having a tube in for almost 6 months that the talc is unnecessary.  I informed him of the indications, risk, benefits, and alternatives of bronchoscopy for IBV removal and chest tube removal.  They understand the risk associated with general anesthesia.  They understand the risks include, but are not limited to death, MI, DVT, PE, stroke, recurrent pneumothorax, respiratory failure, as well as possibility of other unforeseeable complications.  She accepts the risk and wishes to proceed.  She is requesting additional prednisone.  We will give her a  prescription for prednisone 10 mg p.o. daily for 10 days.  Plan: Bronchoscopy for IBV removal and chest tube removal on Thursday, 02/02/2020  [name], MD [facility] [phone]

## 2020-01-31 NOTE — Progress Notes (Signed)
[address]       ,[place] [address]             [phone]     HPI: Amanda [name] returns for scheduled follow-up visit  Amanda [name] is a 79 year old woman with tobacco abuse, severe COPD, protein calorie malnutrition, and recurrent right spontaneous pneumothoraces.  She presented with a right pneumothorax on New Year's Eve 2020.  She previously had recurrent ones on that side year to a year and a half earlier.  She had a prolonged air leak but refused surgery.  She was felt to be a poor surgical candidate anyway.  We blocked off the upper and middle lobes with intrabronchial valves, but after initially improving her air leak worsened again.  We later went back and placed valves in the lower lobes as well.  She refused more aggressive surgical intervention multiple times.  I saw her on 01/10/2020.  Her air leak had decreased dramatically from her prior visit 2 weeks earlier.  Her chest x-ray showed some slight improvement as well.  She has been feeling short of breath.  Her daughter says that on the 15th the bubbles stopped completely and she has not seen any evidence of a leak since then.  She has been feeling short of breath and has been having a lot of wheezing.  She was on a 5-day course of 50 mg of prednisone.  She is requesting a lower dose.  Past Medical History:  Diagnosis Date  . Arthritis   . Bladder cancer ([place])   . Blood transfusion without reported diagnosis   . COPD (chronic obstructive pulmonary disease) ([place])   . COVID-19 07/2019  . Dyspnea   . History of kidney stones   . Pneumonia   . Renal disorder    kidney stones  . Seizures ([place])   . 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 acetaminophen (TYLENOL) 500 MG tablet Take 500-1,000 mg by mouth daily as needed for mild pain or moderate pain.     Marland Kitchen albuterol (PROVENTIL HFA;VENTOLIN HFA) 108 (90 Base) MCG/ACT  inhaler Inhale 1-2 puffs into the lungs every 6 (six) hours as needed for wheezing or shortness of breath. 1 Inhaler 0  . Ascorbic Acid (VITAMIN C) 500 MG CAPS Take 500 mg by mouth daily.     . Cholecalciferol (VITAMIN D) 125 MCG (5000 UT) CAPS Take 5,000 Units by mouth daily.    Marland Kitchen docusate sodium (COLACE) 100 MG capsule Take 1 capsule (100 mg total) by mouth 2 (two) times daily. (Patient taking differently: Take 100 mg by mouth daily as needed for moderate constipation. ) 10 capsule 0  . oxyCODONE-acetaminophen (PERCOCET/ROXICET) 5-325 MG tablet Take 1 tablet by mouth every 8 (eight) hours as needed for severe pain.     . SYMBICORT 80-4.5 MCG/ACT inhaler Inhale 2 puffs into the lungs 2 (two) times daily.    Marland Kitchen ZINC-C-B6 MT Take 1 tablet by mouth daily.    . predniSONE (DELTASONE) 10 MG tablet Take 1 tablet (10 mg total) by mouth daily with breakfast. 10 tablet 0   No current facility-administered medications for this visit.    Physical Exam BP 107/63 (BP Location: Right Arm, Patient Position: Sitting, Cuff Size: Small)   Pulse (!) 107   Temp (!) 97.5 F (36.4 C)   Resp 18   Ht 5\' 5"  (1.651 m)   Wt  87 lb (39.5 kg)   SpO2 96% Comment: 4LNC  BMI 14.48 kg/m  Frail 79 year old woman in no acute distress Nasal cannula oxygen in place Alert and oriented x3 Lungs with wheezing bilaterally, diminished but equal breath sounds Cardiac mildly tachycardic regular No air leak  Diagnostic Tests: CHEST - 2 VIEW  COMPARISON:  01/10/2020.  FINDINGS: Chest tube noted in stable position. Interval resolution of right-sided pneumothorax. Endobronchial valves again noted. Bilateral pulmonary interstitial changes again noted. Stable bilateral pleural thickening consistent scarring. Heart size normal. Degenerative changes scoliosis thoracic spine.  IMPRESSION: Chest tube noted in stable position. Interval resolution of right-sided pneumothorax.   Electronically Signed   By: Marcello Moores   Register   On: 01/31/2020 12:49 I personally reviewed the chest x-ray images and concur with the findings noted above.  Impression: Amanda [name] is a 79 year old woman with a history of tobacco abuse and severe COPD.  She was admitted with a recurrent spontaneous pneumothorax on New Year's Eve.  A chest tube was placed.  She has had a prolonged air leak since then.  Intrabronchial valves in the upper and middle lobes helped initially, but then her air leak worsened again, probably because of cross ventilation.  I placed lower lobe valves on 12/01/2019.  Her air leak persisted after that but when I saw her on 01/10/2020 her air leak was markedly improved.  Today there is no air leak at all.  Per her daughter the air leak stopped about 2 weeks ago.  On chest x-ray the pneumothorax is completely resolved there is some pleural thickening in the area hopefully indicative of some scarring between the lung and the chest wall.  I discussed the option of intrabronchial valve removal and chest tube removal with [name] and her sister and daughter.  Also discussed the possibility of talc pleurodesis.  We talked about the advantages and disadvantages and the risks of talc pleurodesis.  I think given her overall frail condition if she were to have any reaction to the talc that could potentially be fatal in her case.  Hopefully she has developed enough scarring from having a tube in for almost 6 months that the talc is unnecessary.  I informed him of the indications, risk, benefits, and alternatives of bronchoscopy for IBV removal and chest tube removal.  They understand the risk associated with general anesthesia.  They understand the risks include, but are not limited to death, MI, DVT, PE, stroke, recurrent pneumothorax, respiratory failure, as well as possibility of other unforeseeable complications.  She accepts the risk and wishes to proceed.  She is requesting additional prednisone.  We will give her a  prescription for prednisone 10 mg p.o. daily for 10 days.  Plan: Bronchoscopy for IBV removal and chest tube removal on Thursday, 02/02/2020  [name], MD [facility] [phone]

## 2020-02-01 ENCOUNTER — Encounter (HOSPITAL_COMMUNITY): Payer: Self-pay | Admitting: Thoracic Surgery (Cardiothoracic Vascular Surgery)

## 2020-02-01 ENCOUNTER — Other Ambulatory Visit: Payer: Self-pay

## 2020-02-01 ENCOUNTER — Other Ambulatory Visit (HOSPITAL_COMMUNITY)
Admission: RE | Admit: 2020-02-01 | Discharge: 2020-02-01 | Disposition: A | Discharge status: Home / Self Care | Payer: Medicare HMO | Source: Ambulatory Visit | Attending: Thoracic Surgery (Cardiothoracic Vascular Surgery) | Admitting: Thoracic Surgery (Cardiothoracic Vascular Surgery)

## 2020-02-01 DIAGNOSIS — Z20822 Contact with and (suspected) exposure to covid-19: Secondary | ICD-10-CM | POA: Insufficient documentation

## 2020-02-01 DIAGNOSIS — Z01812 Encounter for preprocedural laboratory examination: Secondary | ICD-10-CM | POA: Diagnosis present

## 2020-02-01 DIAGNOSIS — J939 Pneumothorax, unspecified: Secondary | ICD-10-CM

## 2020-02-01 NOTE — Progress Notes (Signed)
Patient denies fever, cough, chest pain or SOB(no new SOB from baseline).Marland Kitchen  PCP - Dr Karie Kirks Cardiologist - n/a Urologist - Dr Clent Jacks  Chest x-ray - 01/31/20 (2V) EKG - 10/03/19 Stress Test - n/a ECHO - n/a Cardiac Cath - n/a  Anesthesia review: Yes  STOP now taking any Aspirin (unless otherwise instructed by your surgeon), Aleve, Naproxen, Ibuprofen, Motrin, Advil, Goody's, BC's, all herbal medications, fish oil, and all vitamins.   Coronavirus Screening Covid test scheduled 02/01/20 at [id number]  Have you experienced the following symptoms:  Cough yes/no: No Fever (>100.58F)  yes/no: No Runny nose yes/no: No Sore throat yes/no: No Difficulty breathing/shortness of breath  yes/no: No  Have you traveled in the last 14 days and where? yes/no: No  Patient and Daughter [name] verbalized understanding of instructions that were given via (speaker) phone.

## 2020-02-02 ENCOUNTER — Other Ambulatory Visit: Payer: Self-pay

## 2020-02-02 ENCOUNTER — Encounter (HOSPITAL_COMMUNITY): Payer: Self-pay | Admitting: Thoracic Surgery (Cardiothoracic Vascular Surgery)

## 2020-02-02 ENCOUNTER — Ambulatory Visit (HOSPITAL_COMMUNITY): Payer: Medicare HMO

## 2020-02-02 ENCOUNTER — Encounter (HOSPITAL_COMMUNITY)
Admission: RE | Disposition: A | Discharge status: Home / Self Care | Payer: Self-pay | Source: Home / Self Care | Attending: Thoracic Surgery (Cardiothoracic Vascular Surgery)

## 2020-02-02 ENCOUNTER — Ambulatory Visit (HOSPITAL_COMMUNITY): Payer: Medicare HMO | Admitting: Physician Assistant

## 2020-02-02 ENCOUNTER — Ambulatory Visit (HOSPITAL_COMMUNITY)
Admission: RE | Admit: 2020-02-02 | Discharge: 2020-02-02 | Disposition: A | Discharge status: Home / Self Care | Payer: Medicare HMO | Attending: Thoracic Surgery (Cardiothoracic Vascular Surgery) | Admitting: Thoracic Surgery (Cardiothoracic Vascular Surgery)

## 2020-02-02 DIAGNOSIS — Z8551 Personal history of malignant neoplasm of bladder: Secondary | ICD-10-CM | POA: Diagnosis not present

## 2020-02-02 DIAGNOSIS — F329 Major depressive disorder, single episode, unspecified: Secondary | ICD-10-CM | POA: Insufficient documentation

## 2020-02-02 DIAGNOSIS — Z7951 Long term (current) use of inhaled steroids: Secondary | ICD-10-CM | POA: Insufficient documentation

## 2020-02-02 DIAGNOSIS — M1990 Unspecified osteoarthritis, unspecified site: Secondary | ICD-10-CM | POA: Diagnosis not present

## 2020-02-02 DIAGNOSIS — Z8616 Personal history of COVID-19: Secondary | ICD-10-CM | POA: Diagnosis not present

## 2020-02-02 DIAGNOSIS — Z7952 Long term (current) use of systemic steroids: Secondary | ICD-10-CM | POA: Diagnosis not present

## 2020-02-02 DIAGNOSIS — Z87891 Personal history of nicotine dependence: Secondary | ICD-10-CM | POA: Insufficient documentation

## 2020-02-02 DIAGNOSIS — Z79899 Other long term (current) drug therapy: Secondary | ICD-10-CM | POA: Diagnosis not present

## 2020-02-02 DIAGNOSIS — Z9981 Dependence on supplemental oxygen: Secondary | ICD-10-CM | POA: Insufficient documentation

## 2020-02-02 DIAGNOSIS — Z9889 Other specified postprocedural states: Secondary | ICD-10-CM

## 2020-02-02 DIAGNOSIS — E46 Unspecified protein-calorie malnutrition: Secondary | ICD-10-CM | POA: Insufficient documentation

## 2020-02-02 DIAGNOSIS — Z4682 Encounter for fitting and adjustment of non-vascular catheter: Secondary | ICD-10-CM | POA: Insufficient documentation

## 2020-02-02 DIAGNOSIS — J9311 Primary spontaneous pneumothorax: Secondary | ICD-10-CM

## 2020-02-02 DIAGNOSIS — J939 Pneumothorax, unspecified: Secondary | ICD-10-CM

## 2020-02-02 DIAGNOSIS — J9382 Other air leak: Secondary | ICD-10-CM | POA: Diagnosis not present

## 2020-02-02 DIAGNOSIS — J9383 Other pneumothorax: Secondary | ICD-10-CM | POA: Diagnosis present

## 2020-02-02 DIAGNOSIS — J449 Chronic obstructive pulmonary disease, unspecified: Secondary | ICD-10-CM | POA: Diagnosis not present

## 2020-02-02 DIAGNOSIS — F419 Anxiety disorder, unspecified: Secondary | ICD-10-CM | POA: Insufficient documentation

## 2020-02-02 HISTORY — DX: Presence of dental prosthetic device (complete) (partial): Z97.2

## 2020-02-02 HISTORY — DX: Personal history of nicotine dependence: Z87.891

## 2020-02-02 HISTORY — PX: CHEST TUBE INSERTION: SHX231

## 2020-02-02 HISTORY — DX: Dependence on supplemental oxygen: Z99.81

## 2020-02-02 HISTORY — DX: Anemia, unspecified: D64.9

## 2020-02-02 HISTORY — DX: Presence of spectacles and contact lenses: Z97.3

## 2020-02-02 HISTORY — DX: Anxiety disorder, unspecified: F41.9

## 2020-02-02 HISTORY — PX: VIDEO BRONCHOSCOPY WITH INSERTION OF INTERBRONCHIAL VALVE (IBV): SHX6178

## 2020-02-02 LAB — COMPREHENSIVE METABOLIC PANEL
ALT: 9 U/L (ref 0–44)
AST: 58 U/L — ABNORMAL HIGH (ref 15–41)
Albumin: 3.3 g/dL — ABNORMAL LOW (ref 3.5–5.0)
Alkaline Phosphatase: 70 U/L (ref 38–126)
Anion gap: 10 (ref 5–15)
BUN: 15 mg/dL (ref 8–23)
CO2: 26 mmol/L (ref 22–32)
Calcium: 9.2 mg/dL (ref 8.9–10.3)
Chloride: 104 mmol/L (ref 98–111)
Creatinine, Ser: 0.75 mg/dL (ref 0.44–1.00)
GFR calc Af Amer: >60 mL/min (ref >60)
GFR calc non Af Amer: >60 mL/min (ref >60)
Glucose, Bld: 120 mg/dL — ABNORMAL HIGH (ref 70–99)
Potassium: 6 mmol/L — ABNORMAL HIGH (ref 3.5–5.1)
Sodium: 140 mmol/L (ref 135–145)
Total Bilirubin: 1.6 mg/dL — ABNORMAL HIGH (ref 0.3–1.2)
Total Protein: 7.2 g/dL (ref 6.5–8.1)

## 2020-02-02 LAB — CBC
HCT: 40 % (ref 36.0–46.0)
Hemoglobin: 12.6 g/dL (ref 12.0–15.0)
MCH: 29.2 pg (ref 26.0–34.0)
MCHC: 31.5 g/dL (ref 30.0–36.0)
MCV: 92.8 fL (ref 80.0–100.0)
Platelets: 367 10*3/uL (ref 150–400)
RBC: 4.31 MIL/uL (ref 3.87–5.11)
RDW: 16.2 % — ABNORMAL HIGH (ref 11.5–15.5)
WBC: 9.2 10*3/uL (ref 4.0–10.5)
nRBC: 0 % (ref 0.0–0.2)

## 2020-02-02 LAB — PROTIME-INR
INR: 1 (ref 0.8–1.2)
Prothrombin Time: 12.5 seconds (ref 11.4–15.2)

## 2020-02-02 LAB — SARS CORONAVIRUS 2 (TAT 6-24 HRS): SARS Coronavirus 2: NEGATIVE

## 2020-02-02 LAB — APTT: aPTT: 30 seconds (ref 24–36)

## 2020-02-02 SURGERY — BRONCHOSCOPY, FLEXIBLE, WITH INTRABRONCHIAL VALVE INSERTION
Anesthesia: General | Laterality: Right

## 2020-02-02 MED ORDER — PHENYLEPHRINE 40 MCG/ML (10ML) SYRINGE FOR IV PUSH (FOR BLOOD PRESSURE SUPPORT)
PREFILLED_SYRINGE | INTRAVENOUS | Status: AC
  Filled 2020-02-02: qty 10

## 2020-02-02 MED ORDER — FENTANYL CITRATE (PF) 250 MCG/5ML IJ SOLN
INTRAMUSCULAR | Status: AC
  Filled 2020-02-02: qty 5

## 2020-02-02 MED ORDER — EPINEPHRINE PF 1 MG/ML IJ SOLN
INTRAMUSCULAR | Status: DC | PRN
  Administered 2020-02-02: 1 mg

## 2020-02-02 MED ORDER — DEXAMETHASONE SODIUM PHOSPHATE 10 MG/ML IJ SOLN
INTRAMUSCULAR | Status: DC | PRN
  Administered 2020-02-02: 10 mg via INTRAVENOUS

## 2020-02-02 MED ORDER — SUGAMMADEX SODIUM 200 MG/2ML IV SOLN
INTRAVENOUS | Status: DC | PRN
  Administered 2020-02-02: 200 mg via INTRAVENOUS

## 2020-02-02 MED ORDER — LACTATED RINGERS IV SOLN: INTRAVENOUS | Status: DC

## 2020-02-02 MED ORDER — ACETAMINOPHEN 160 MG/5ML PO SOLN: 325.0000 mg | Freq: Once | ORAL | Status: DC | PRN

## 2020-02-02 MED ORDER — LIDOCAINE 2% (20 MG/ML) 5 ML SYRINGE
INTRAMUSCULAR | Status: DC | PRN
  Administered 2020-02-02: 60 mg via INTRAVENOUS

## 2020-02-02 MED ORDER — ALBUTEROL SULFATE HFA 108 (90 BASE) MCG/ACT IN AERS
INHALATION_SPRAY | RESPIRATORY_TRACT | Status: DC | PRN
  Administered 2020-02-02: 6 via RESPIRATORY_TRACT

## 2020-02-02 MED ORDER — ALBUTEROL SULFATE (2.5 MG/3ML) 0.083% IN NEBU
2.5000 mg | INHALATION_SOLUTION | Freq: Once | RESPIRATORY_TRACT | Status: AC
  Administered 2020-02-02: 2.5 mg via RESPIRATORY_TRACT

## 2020-02-02 MED ORDER — ACETAMINOPHEN 10 MG/ML IV SOLN: 1000.0000 mg | Freq: Once | INTRAVENOUS | Status: DC | PRN

## 2020-02-02 MED ORDER — PROPOFOL 10 MG/ML IV BOLUS
INTRAVENOUS | Status: AC
  Filled 2020-02-02: qty 20

## 2020-02-02 MED ORDER — ROCURONIUM BROMIDE 10 MG/ML (PF) SYRINGE
PREFILLED_SYRINGE | INTRAVENOUS | Status: DC | PRN
  Administered 2020-02-02: 50 mg via INTRAVENOUS

## 2020-02-02 MED ORDER — ONDANSETRON HCL 4 MG/2ML IJ SOLN
INTRAMUSCULAR | Status: DC | PRN
  Administered 2020-02-02: 4 mg via INTRAVENOUS

## 2020-02-02 MED ORDER — 0.9 % SODIUM CHLORIDE (POUR BTL) OPTIME
TOPICAL | Status: DC | PRN
  Administered 2020-02-02: 1000 mL

## 2020-02-02 MED ORDER — PROPOFOL 10 MG/ML IV BOLUS
INTRAVENOUS | Status: DC | PRN
  Administered 2020-02-02: 30 mg via INTRAVENOUS

## 2020-02-02 MED ORDER — ACETAMINOPHEN 325 MG PO TABS: 325.0000 mg | ORAL_TABLET | Freq: Once | ORAL | Status: DC | PRN

## 2020-02-02 MED ORDER — ALBUTEROL SULFATE (2.5 MG/3ML) 0.083% IN NEBU
INHALATION_SOLUTION | RESPIRATORY_TRACT | Status: AC
  Filled 2020-02-02: qty 3

## 2020-02-02 MED ORDER — FENTANYL CITRATE (PF) 250 MCG/5ML IJ SOLN
INTRAMUSCULAR | Status: DC | PRN
  Administered 2020-02-02: 50 ug via INTRAVENOUS

## 2020-02-02 MED ORDER — EPINEPHRINE PF 1 MG/ML IJ SOLN
INTRAMUSCULAR | Status: AC
  Filled 2020-02-02: qty 1

## 2020-02-02 MED ORDER — FENTANYL CITRATE (PF) 100 MCG/2ML IJ SOLN: 25.0000 ug | INTRAMUSCULAR | Status: DC | PRN

## 2020-02-02 MED ORDER — DEXAMETHASONE SODIUM PHOSPHATE 10 MG/ML IJ SOLN
INTRAMUSCULAR | Status: AC
  Filled 2020-02-02: qty 1

## 2020-02-02 MED ORDER — PHENYLEPHRINE 40 MCG/ML (10ML) SYRINGE FOR IV PUSH (FOR BLOOD PRESSURE SUPPORT)
PREFILLED_SYRINGE | INTRAVENOUS | Status: DC | PRN
  Administered 2020-02-02: 80 ug via INTRAVENOUS
  Administered 2020-02-02: 120 ug via INTRAVENOUS

## 2020-02-02 MED ORDER — ONDANSETRON HCL 4 MG/2ML IJ SOLN
INTRAMUSCULAR | Status: AC
  Filled 2020-02-02: qty 2

## 2020-02-02 SURGICAL SUPPLY — 50 items
ADAPTER VALVE BIOPSY EBUS (MISCELLANEOUS) IMPLANT
ADPTR VALVE BIOPSY EBUS (MISCELLANEOUS)
BASKET PULM ZERO TIP 12MMX120 (MISCELLANEOUS) ×1
BASKET PULM ZERO TIP 12X120 (MISCELLANEOUS) ×1 IMPLANT
BASKET PULM ZERO TIP 16MMX120 (BASKET) ×1
BASKET PULM ZERO TIP 16X120 (BASKET) ×1 IMPLANT
BLADE CLIPPER SURG (BLADE) ×2 IMPLANT
BSKT SPEC RTRVL ZERO TP 120X12 (MISCELLANEOUS) ×2
BSKT SPEC RTRVL ZERO TP 120X16 (BASKET) ×2
CANISTER SUCT 3000ML PPV (MISCELLANEOUS) ×4 IMPLANT
CNTNR URN SCR LID CUP LEK RST (MISCELLANEOUS) ×2 IMPLANT
CONT SPEC 4OZ STRL OR WHT (MISCELLANEOUS) ×4
COVER BACK TABLE 60X90IN (DRAPES) ×4 IMPLANT
DRSG ADAPTIC 3X8 NADH LF (GAUZE/BANDAGES/DRESSINGS) ×2 IMPLANT
DRSG EMULSION OIL 3X3 NADH (GAUZE/BANDAGES/DRESSINGS) ×2 IMPLANT
FILTER STRAW FLUID ASPIR (MISCELLANEOUS) ×2 IMPLANT
FORCEPS BIOP RJ4 1.8 (CUTTING FORCEPS) IMPLANT
FORCEPS RADIAL JAW LRG 4 PULM (INSTRUMENTS) IMPLANT
GAUZE SPONGE 4X4 12PLY STRL (GAUZE/BANDAGES/DRESSINGS) ×4 IMPLANT
GLOVE SURG SIGNA 7.5 PF LTX (GLOVE) ×4 IMPLANT
GOWN STRL REUS W/ TWL XL LVL3 (GOWN DISPOSABLE) ×2 IMPLANT
GOWN STRL REUS W/TWL XL LVL3 (GOWN DISPOSABLE) ×4
KIT CLEAN ENDO COMPLIANCE (KITS) ×4 IMPLANT
KIT TURNOVER KIT B (KITS) ×4 IMPLANT
MARKER SKIN DUAL TIP RULER LAB (MISCELLANEOUS) ×2 IMPLANT
NS IRRIG 1000ML POUR BTL (IV SOLUTION) ×4 IMPLANT
OIL SILICONE PENTAX (PARTS (SERVICE/REPAIRS)) IMPLANT
PAD ARMBOARD 7.5X6 YLW CONV (MISCELLANEOUS) ×8 IMPLANT
RADIAL JAW LRG 4 PULMONARY (INSTRUMENTS) ×2
SNARE SHORT THROW 13M SML OVAL (MISCELLANEOUS) ×2 IMPLANT
STOPCOCK MORSE 400PSI 3WAY (MISCELLANEOUS) ×2 IMPLANT
SYR 10ML LL (SYRINGE) ×2 IMPLANT
SYR 20ML ECCENTRIC (SYRINGE) ×4 IMPLANT
SYR 30ML SLIP (SYRINGE) ×2 IMPLANT
SYR 3ML LL SCALE MARK (SYRINGE) ×2 IMPLANT
TAPE CLOTH SURG 4X10 WHT LF (GAUZE/BANDAGES/DRESSINGS) ×2 IMPLANT
TOWEL GREEN STERILE (TOWEL DISPOSABLE) ×4 IMPLANT
TOWEL GREEN STERILE FF (TOWEL DISPOSABLE) ×4 IMPLANT
TOWEL NATURAL 4PK STERILE (DISPOSABLE) ×4 IMPLANT
TRAP SPECIMEN MUCOUS 40CC (MISCELLANEOUS) ×4 IMPLANT
TRAP SPECIMEN MUCUS 40CC (MISCELLANEOUS) ×2 IMPLANT
TUBE CONNECTING 12'X1/4 (SUCTIONS) ×1
TUBE CONNECTING 12X1/4 (SUCTIONS) ×1 IMPLANT
TUBE CONNECTING 20'X1/4 (TUBING) ×1
TUBE CONNECTING 20X1/4 (TUBING) ×3 IMPLANT
UNDERPAD 30X30 (UNDERPADS AND DIAPERS) ×4 IMPLANT
VALVE BIOPSY  SINGLE USE (MISCELLANEOUS) ×4
VALVE BIOPSY SINGLE USE (MISCELLANEOUS) ×2 IMPLANT
VALVE SUCTION BRONCHIO DISP (MISCELLANEOUS) ×4 IMPLANT
WATER STERILE IRR 1000ML POUR (IV SOLUTION) ×4 IMPLANT

## 2020-02-02 NOTE — Anesthesia Preprocedure Evaluation (Addendum)
Anesthesia Evaluation  Patient identified by MRN, date of birth, ID band Patient awake    Reviewed: Allergy & Precautions, NPO status , Patient's Chart, lab work & pertinent test results  Airway Mallampati: I  TM Distance: >3 FB Neck ROM: Full    Dental  (+) Edentulous Upper, Edentulous Lower   Pulmonary COPD,  COPD inhaler and oxygen dependent, former smoker,     + decreased breath sounds      Cardiovascular negative cardio ROS   Rhythm:Regular Rate:Normal     Neuro/Psych Seizures -,  PSYCHIATRIC DISORDERS Anxiety Dementia    GI/Hepatic negative GI ROS, Neg liver ROS,   Endo/Other  negative endocrine ROS  Renal/GU Renal disease     Musculoskeletal  (+) Arthritis ,   Abdominal Normal abdominal exam  (+)   Peds  Hematology negative hematology ROS (+)   Anesthesia Other Findings   Reproductive/Obstetrics                             Anesthesia Physical Anesthesia Plan  ASA: III  Anesthesia Plan: General   Post-op Pain Management:    Induction: Intravenous  PONV Risk Score and Plan: 4 or greater and Ondansetron and Treatment may vary due to age or medical condition  Airway Management Planned: Oral ETT  Additional Equipment: None  Intra-op Plan:   Post-operative Plan: Possible Post-op intubation/ventilation  Informed Consent: I have reviewed the patients History and Physical, chart, labs and discussed the procedure including the risks, benefits and alternatives for the proposed anesthesia with the patient or authorized representative who has indicated his/her understanding and acceptance.     Dental advisory given  Plan Discussed with: CRNA  Anesthesia Plan Comments:        Anesthesia Quick Evaluation

## 2020-02-02 NOTE — Brief Op Note (Signed)
02/02/2020  1:40 PM  PATIENT:  Amanda [name]  79 y.o. female  PRE-OPERATIVE DIAGNOSIS:  RECURRENT RIGHT PTX  POST-OPERATIVE DIAGNOSIS:  RECURRENT RIGHT PTX  PROCEDURE:  Procedure(s): VIDEO BRONCHOSCOPY WITH REMOVAL OF INTERBRONCHIAL VALVE (IBV) times eight (N/A) CHEST TUBE REMOVAL (Right)  SURGEON:  Surgeon(s) and Role:    * [name], MD - Primary  PHYSICIAN ASSISTANT:   ASSISTANTS: none   ANESTHESIA:   general  EBL:  0 mL   BLOOD ADMINISTERED:none  DRAINS: none   LOCAL MEDICATIONS USED:  NONE  SPECIMEN:  No Specimen  DISPOSITION OF SPECIMEN:  N/A  COUNTS:  NO endoscopic  TOURNIQUET:  * No tourniquets in log *  DICTATION: .Other Dictation: Dictation Number -  PLAN OF CARE: Discharge to home after PACU  PATIENT DISPOSITION:  PACU - hemodynamically stable.   Delay start of Pharmacological VTE agent (>24hrs) due to surgical blood loss or risk of bleeding: not applicable

## 2020-02-02 NOTE — Transfer of Care (Signed)
Immediate Anesthesia Transfer of Care Note  Patient: Amanda [name]  Procedure(s) Performed: VIDEO BRONCHOSCOPY WITH REMOVAL OF INTERBRONCHIAL VALVE (IBV) times eight (N/A ) CHEST TUBE REMOVAL (Right )  Patient Location: PACU  Anesthesia Type:General  Level of Consciousness: awake, alert  and oriented  Airway & Oxygen Therapy: Patient Spontanous Breathing and Patient connected to nasal cannula oxygen  Post-op Assessment: Report given to RN, Post -op Vital signs reviewed and stable and Patient moving all extremities X 4  Post vital signs: Reviewed and stable  Last Vitals:  Vitals Value Taken Time  BP    Temp    Pulse    Resp    SpO2      Last Pain:  Vitals:   02/02/20 1002  TempSrc:   PainSc: 0-No pain         Complications: No apparent anesthesia complications

## 2020-02-02 NOTE — Discharge Instructions (Addendum)
Do not drive or engage in heavy physical activity for 48 hours  You may cough up small amounts of blood over the next few days  Leave dressing in place over CT site for 48 hours. You may then remove it, keep covered with dry gauze if there is any drainage.  You may use acetaminophen (Tylenol) if needed for discomfort  My office will contact you with a follow up appointment  Call [phone] if you develop chest pain, shortness of breath or fever > 101 F

## 2020-02-02 NOTE — Op Note (Signed)
NAME: Roeder, Amanda [name] MEDICAL RECORD [id number] ACCOUNT [id number] DATE OF BIRTH:January 01, 1941 FACILITY: [facility] LOCATION: [facility] PHYSICIAN:STEVEN C. HENDRICKSON, MD  OPERATIVE REPORT  DATE OF PROCEDURE:  02/02/2020  PREOPERATIVE DIAGNOSIS:  Spontaneous pneumothorax with prolonged air leak, status post intrabronchial valve placement.  POSTOPERATIVE DIAGNOSIS:  Spontaneous pneumothorax with prolonged air leak, status post intrabronchial valve placement.  PROCEDURE:  Bronchoscopy for removal of intrabronchial valves and chest tube removal.  SURGEON:  Modesto Charon, MD  ASSISTANT:  None.  ANESTHESIA:  General.  FINDINGS:  Eight valves removed.  No air leak.  Chest tube was removed without difficulty.  CLINICAL NOTE:  The patient is a 79 year old woman with severe COPD, who presented with a recurrent right spontaneous pneumothorax on  New Year's Eve 2020.  She had a chest tube placed and had a prolonged air leak.  She refused surgical intervention.   Intrabronchial valves were placed on 2 separate occasions.  Ultimately, her air leak stopped about 3 weeks prior to this admission.  She was advised to undergo bronchoscopy for intrabronchial valve removal and chest tube removal.  The indications, risks,  benefits, and alternatives were discussed in detail with the patient.  She understood and accepted the risks and agreed to proceed.  DESCRIPTION OF PROCEDURE:  The patient was brought to the operating room on 02/02/2020.  She had induction of general anesthesia and was intubated.  A timeout was performed.  Flexible fiberoptic bronchoscopy was performed via the endotracheal tube.   There was normal endobronchial anatomy.  Valves were present in all of the segmental bronchi of the right lung.  The middle lobe valve was removed first, followed by the lower lobe valves and finally the upper lobe valves.  All the valves were removed  relatively easily, except the anterior segmental valve in  the upper lobe was difficult to remove due to the angulation and inability to get the biopsy forceps around the stem of the valve.  Ultimately, this was snared and pulled out enough that the  biopsy forceps could then be used to remove it.  There was no evidence of air leak after removal of any of the valves.  As the patient emerged from anesthesia and began spontaneously breathing, there was no air leak from the chest tube and it was removed  without difficulty.  The patient then was extubated in the operating room and taken to the [facility] in good condition.  VN/NUANCE  D:02/02/2020 T:02/02/2020 JOB:[id number]

## 2020-02-02 NOTE — Interval H&P Note (Signed)
History and Physical Interval Note: No air leak  02/02/2020 12:00 PM  Amanda [name]  has presented today for surgery, with the diagnosis of RECURRENT RIGHT PTX.  The various methods of treatment have been discussed with the patient and family. After consideration of risks, benefits and other options for treatment, the patient has consented to  Procedure(s): VIDEO BRONCHOSCOPY WITH REMOVAL OF INTERBRONCHIAL VALVE (IBV) (N/A) CHEST TUBE REMOVAL (Right) as a surgical intervention.  The patient's history has been reviewed, patient examined, no change in status, stable for surgery.  I have reviewed the patient's chart and labs.  Questions were answered to the patient's satisfaction.     [name]

## 2020-02-02 NOTE — Anesthesia Procedure Notes (Signed)
Procedure Name: Intubation Date/Time: 02/02/2020 12:27 PM Performed by: Inda Coke, CRNA Pre-anesthesia Checklist: Patient identified, Emergency Drugs available, Suction available and Patient being monitored Patient Re-evaluated:Patient Re-evaluated prior to induction Oxygen Delivery Method: Circle System Utilized Preoxygenation: Pre-oxygenation with 100% oxygen Induction Type: IV induction Ventilation: Mask ventilation without difficulty Laryngoscope Size: Mac and 3 Grade View: Grade I Tube type: Oral Tube size: 8.5 mm Number of attempts: 1 Airway Equipment and Method: Stylet and Oral airway Placement Confirmation: ETT inserted through vocal cords under direct vision,  positive ETCO2 and breath sounds checked- equal and bilateral Secured at: 21 cm Tube secured with: Tape Dental Injury: Teeth and Oropharynx as per pre-operative assessment

## 2020-02-03 ENCOUNTER — Encounter: Payer: Self-pay | Admitting: *Deleted

## 2020-02-03 NOTE — Anesthesia Postprocedure Evaluation (Signed)
Anesthesia Post Note  Patient: Amanda [name]  Procedure(s) Performed: VIDEO BRONCHOSCOPY WITH REMOVAL OF INTERBRONCHIAL VALVE (IBV) times eight (N/A ) CHEST TUBE REMOVAL (Right )     Patient location during evaluation: PACU Anesthesia Type: General Level of consciousness: awake and alert Pain management: pain level controlled Vital Signs Assessment: post-procedure vital signs reviewed and stable Respiratory status: spontaneous breathing, nonlabored ventilation, respiratory function stable and patient connected to nasal cannula oxygen Cardiovascular status: blood pressure returned to baseline and stable Postop Assessment: no apparent nausea or vomiting Anesthetic complications: no    Last Vitals:  Vitals:   02/02/20 1415 02/02/20 1416  BP:  (!) 110/55  Pulse: (!) 105 (!) 106  Resp: (!) 21 20  Temp:  36.9 C  SpO2: 99% 100%    Last Pain:  Vitals:   02/02/20 1416  TempSrc:   PainSc: 0-No pain                 Effie Berkshire

## 2020-02-06 ENCOUNTER — Other Ambulatory Visit: Payer: Self-pay | Admitting: Thoracic Surgery (Cardiothoracic Vascular Surgery)

## 2020-02-06 DIAGNOSIS — J9383 Other pneumothorax: Secondary | ICD-10-CM

## 2020-02-06 DIAGNOSIS — J939 Pneumothorax, unspecified: Secondary | ICD-10-CM

## 2020-02-07 ENCOUNTER — Ambulatory Visit
Admission: RE | Admit: 2020-02-07 | Discharge: 2020-02-07 | Disposition: A | Discharge status: Home / Self Care | Payer: Medicare HMO | Source: Ambulatory Visit | Attending: Thoracic Surgery (Cardiothoracic Vascular Surgery) | Admitting: Thoracic Surgery (Cardiothoracic Vascular Surgery)

## 2020-02-07 ENCOUNTER — Other Ambulatory Visit: Payer: Self-pay

## 2020-02-07 ENCOUNTER — Ambulatory Visit (INDEPENDENT_AMBULATORY_CARE_PROVIDER_SITE_OTHER): Payer: Medicare HMO | Admitting: Thoracic Surgery (Cardiothoracic Vascular Surgery)

## 2020-02-07 VITALS — BP 120/58 | HR 100 | Temp 97.7°F | Resp 20 | Ht 65.0 in | Wt 90.8 lb

## 2020-02-07 DIAGNOSIS — J9383 Other pneumothorax: Secondary | ICD-10-CM | POA: Diagnosis not present

## 2020-02-07 DIAGNOSIS — J939 Pneumothorax, unspecified: Secondary | ICD-10-CM

## 2020-02-07 DIAGNOSIS — Z9889 Other specified postprocedural states: Secondary | ICD-10-CM | POA: Diagnosis not present

## 2020-02-07 DIAGNOSIS — Z09 Encounter for follow-up examination after completed treatment for conditions other than malignant neoplasm: Secondary | ICD-10-CM | POA: Diagnosis not present

## 2020-10-04 DIAGNOSIS — Z0100 Encounter for examination of eyes and vision without abnormal findings: Secondary | ICD-10-CM | POA: Diagnosis not present

## 2020-10-14 DIAGNOSIS — J439 Emphysema, unspecified: Secondary | ICD-10-CM | POA: Diagnosis not present

## 2020-10-14 DIAGNOSIS — J449 Chronic obstructive pulmonary disease, unspecified: Secondary | ICD-10-CM | POA: Diagnosis not present

## 2020-10-18 DIAGNOSIS — R0602 Shortness of breath: Secondary | ICD-10-CM | POA: Diagnosis not present

## 2020-10-18 DIAGNOSIS — J439 Emphysema, unspecified: Secondary | ICD-10-CM | POA: Diagnosis not present

## 2020-10-30 DIAGNOSIS — Z936 Other artificial openings of urinary tract status: Secondary | ICD-10-CM | POA: Diagnosis not present

## 2020-11-14 DIAGNOSIS — J449 Chronic obstructive pulmonary disease, unspecified: Secondary | ICD-10-CM | POA: Diagnosis not present

## 2020-11-14 DIAGNOSIS — J439 Emphysema, unspecified: Secondary | ICD-10-CM | POA: Diagnosis not present

## 2020-11-19 DIAGNOSIS — M5489 Other dorsalgia: Secondary | ICD-10-CM | POA: Diagnosis not present

## 2020-11-19 DIAGNOSIS — Z79891 Long term (current) use of opiate analgesic: Secondary | ICD-10-CM | POA: Diagnosis not present

## 2020-11-19 DIAGNOSIS — Z8616 Personal history of COVID-19: Secondary | ICD-10-CM | POA: Diagnosis not present

## 2020-11-19 DIAGNOSIS — J449 Chronic obstructive pulmonary disease, unspecified: Secondary | ICD-10-CM | POA: Diagnosis not present

## 2020-12-12 DIAGNOSIS — J449 Chronic obstructive pulmonary disease, unspecified: Secondary | ICD-10-CM | POA: Diagnosis not present

## 2020-12-12 DIAGNOSIS — J439 Emphysema, unspecified: Secondary | ICD-10-CM | POA: Diagnosis not present

## 2021-01-09 DIAGNOSIS — Z936 Other artificial openings of urinary tract status: Secondary | ICD-10-CM | POA: Diagnosis not present

## 2021-01-12 DIAGNOSIS — J439 Emphysema, unspecified: Secondary | ICD-10-CM | POA: Diagnosis not present

## 2021-01-12 DIAGNOSIS — J449 Chronic obstructive pulmonary disease, unspecified: Secondary | ICD-10-CM | POA: Diagnosis not present

## 2021-01-29 DIAGNOSIS — J449 Chronic obstructive pulmonary disease, unspecified: Secondary | ICD-10-CM | POA: Diagnosis not present

## 2021-01-29 DIAGNOSIS — Z9981 Dependence on supplemental oxygen: Secondary | ICD-10-CM | POA: Diagnosis not present

## 2021-01-29 DIAGNOSIS — M545 Low back pain, unspecified: Secondary | ICD-10-CM | POA: Diagnosis not present

## 2021-01-29 DIAGNOSIS — Z8616 Personal history of COVID-19: Secondary | ICD-10-CM | POA: Diagnosis not present

## 2021-02-11 DIAGNOSIS — J449 Chronic obstructive pulmonary disease, unspecified: Secondary | ICD-10-CM | POA: Diagnosis not present

## 2021-02-11 DIAGNOSIS — J439 Emphysema, unspecified: Secondary | ICD-10-CM | POA: Diagnosis not present

## 2021-03-14 DIAGNOSIS — J449 Chronic obstructive pulmonary disease, unspecified: Secondary | ICD-10-CM | POA: Diagnosis not present

## 2021-03-14 DIAGNOSIS — J439 Emphysema, unspecified: Secondary | ICD-10-CM | POA: Diagnosis not present

## 2021-03-22 DIAGNOSIS — J029 Acute pharyngitis, unspecified: Secondary | ICD-10-CM | POA: Diagnosis not present

## 2021-03-22 DIAGNOSIS — J449 Chronic obstructive pulmonary disease, unspecified: Secondary | ICD-10-CM | POA: Diagnosis not present

## 2021-03-22 DIAGNOSIS — S22059A Unspecified fracture of T5-T6 vertebra, initial encounter for closed fracture: Secondary | ICD-10-CM | POA: Diagnosis not present

## 2021-03-22 DIAGNOSIS — Z20822 Contact with and (suspected) exposure to covid-19: Secondary | ICD-10-CM | POA: Diagnosis not present

## 2021-03-22 DIAGNOSIS — J441 Chronic obstructive pulmonary disease with (acute) exacerbation: Secondary | ICD-10-CM | POA: Diagnosis not present

## 2021-03-22 DIAGNOSIS — X58XXXA Exposure to other specified factors, initial encounter: Secondary | ICD-10-CM | POA: Diagnosis not present

## 2021-03-22 DIAGNOSIS — M4854XA Collapsed vertebra, not elsewhere classified, thoracic region, initial encounter for fracture: Secondary | ICD-10-CM | POA: Diagnosis not present

## 2021-03-22 DIAGNOSIS — R059 Cough, unspecified: Secondary | ICD-10-CM | POA: Diagnosis not present

## 2021-03-31 DIAGNOSIS — R059 Cough, unspecified: Secondary | ICD-10-CM | POA: Diagnosis not present

## 2021-03-31 DIAGNOSIS — Z882 Allergy status to sulfonamides status: Secondary | ICD-10-CM | POA: Diagnosis not present

## 2021-03-31 DIAGNOSIS — Z8551 Personal history of malignant neoplasm of bladder: Secondary | ICD-10-CM | POA: Diagnosis not present

## 2021-03-31 DIAGNOSIS — Z886 Allergy status to analgesic agent status: Secondary | ICD-10-CM | POA: Diagnosis not present

## 2021-03-31 DIAGNOSIS — J441 Chronic obstructive pulmonary disease with (acute) exacerbation: Secondary | ICD-10-CM | POA: Diagnosis not present

## 2021-03-31 DIAGNOSIS — M810 Age-related osteoporosis without current pathological fracture: Secondary | ICD-10-CM | POA: Diagnosis not present

## 2021-03-31 DIAGNOSIS — Z90710 Acquired absence of both cervix and uterus: Secondary | ICD-10-CM | POA: Diagnosis not present

## 2021-03-31 DIAGNOSIS — Z885 Allergy status to narcotic agent status: Secondary | ICD-10-CM | POA: Diagnosis not present

## 2021-03-31 DIAGNOSIS — Z880 Allergy status to penicillin: Secondary | ICD-10-CM | POA: Diagnosis not present

## 2021-03-31 DIAGNOSIS — Z79899 Other long term (current) drug therapy: Secondary | ICD-10-CM | POA: Diagnosis not present

## 2021-04-08 DIAGNOSIS — M7501 Adhesive capsulitis of right shoulder: Secondary | ICD-10-CM | POA: Diagnosis not present

## 2021-04-08 DIAGNOSIS — Z9981 Dependence on supplemental oxygen: Secondary | ICD-10-CM | POA: Diagnosis not present

## 2021-04-08 DIAGNOSIS — J449 Chronic obstructive pulmonary disease, unspecified: Secondary | ICD-10-CM | POA: Diagnosis not present

## 2021-04-08 DIAGNOSIS — Z79891 Long term (current) use of opiate analgesic: Secondary | ICD-10-CM | POA: Diagnosis not present

## 2021-04-13 DIAGNOSIS — J449 Chronic obstructive pulmonary disease, unspecified: Secondary | ICD-10-CM | POA: Diagnosis not present

## 2021-04-13 DIAGNOSIS — J439 Emphysema, unspecified: Secondary | ICD-10-CM | POA: Diagnosis not present

## 2021-05-14 DIAGNOSIS — J439 Emphysema, unspecified: Secondary | ICD-10-CM | POA: Diagnosis not present

## 2021-05-14 DIAGNOSIS — J449 Chronic obstructive pulmonary disease, unspecified: Secondary | ICD-10-CM | POA: Diagnosis not present

## 2021-06-14 DIAGNOSIS — J439 Emphysema, unspecified: Secondary | ICD-10-CM | POA: Diagnosis not present

## 2021-06-14 DIAGNOSIS — J449 Chronic obstructive pulmonary disease, unspecified: Secondary | ICD-10-CM | POA: Diagnosis not present

## 2021-07-05 DIAGNOSIS — M5136 Other intervertebral disc degeneration, lumbar region: Secondary | ICD-10-CM | POA: Diagnosis not present

## 2021-07-05 DIAGNOSIS — J449 Chronic obstructive pulmonary disease, unspecified: Secondary | ICD-10-CM | POA: Diagnosis not present

## 2021-07-05 DIAGNOSIS — Z8616 Personal history of COVID-19: Secondary | ICD-10-CM | POA: Diagnosis not present

## 2021-07-05 DIAGNOSIS — Z9981 Dependence on supplemental oxygen: Secondary | ICD-10-CM | POA: Diagnosis not present

## 2021-07-14 DIAGNOSIS — J439 Emphysema, unspecified: Secondary | ICD-10-CM | POA: Diagnosis not present

## 2021-07-14 DIAGNOSIS — J449 Chronic obstructive pulmonary disease, unspecified: Secondary | ICD-10-CM | POA: Diagnosis not present

## 2021-07-20 DIAGNOSIS — J439 Emphysema, unspecified: Secondary | ICD-10-CM | POA: Diagnosis not present

## 2021-07-20 DIAGNOSIS — I739 Peripheral vascular disease, unspecified: Secondary | ICD-10-CM | POA: Diagnosis not present

## 2021-07-20 DIAGNOSIS — R636 Underweight: Secondary | ICD-10-CM | POA: Diagnosis not present

## 2021-07-20 DIAGNOSIS — M810 Age-related osteoporosis without current pathological fracture: Secondary | ICD-10-CM | POA: Diagnosis not present

## 2021-07-20 DIAGNOSIS — J9610 Chronic respiratory failure, unspecified whether with hypoxia or hypercapnia: Secondary | ICD-10-CM | POA: Diagnosis not present

## 2021-07-20 DIAGNOSIS — R69 Illness, unspecified: Secondary | ICD-10-CM | POA: Diagnosis not present

## 2021-07-20 DIAGNOSIS — Z008 Encounter for other general examination: Secondary | ICD-10-CM | POA: Diagnosis not present

## 2021-07-20 DIAGNOSIS — G8929 Other chronic pain: Secondary | ICD-10-CM | POA: Diagnosis not present

## 2021-07-20 DIAGNOSIS — T7840XD Allergy, unspecified, subsequent encounter: Secondary | ICD-10-CM | POA: Diagnosis not present

## 2021-07-20 DIAGNOSIS — Z932 Ileostomy status: Secondary | ICD-10-CM | POA: Diagnosis not present

## 2021-08-14 DIAGNOSIS — J439 Emphysema, unspecified: Secondary | ICD-10-CM | POA: Diagnosis not present

## 2021-08-14 DIAGNOSIS — J449 Chronic obstructive pulmonary disease, unspecified: Secondary | ICD-10-CM | POA: Diagnosis not present

## 2021-09-13 DIAGNOSIS — J439 Emphysema, unspecified: Secondary | ICD-10-CM | POA: Diagnosis not present

## 2021-09-13 DIAGNOSIS — J449 Chronic obstructive pulmonary disease, unspecified: Secondary | ICD-10-CM | POA: Diagnosis not present

## 2021-09-16 IMAGING — DX DG CHEST 2V
2 series · 2 of 2 positions shown · non-contrast
Comparison: October 25, 2019.

CLINICAL DATA: Spontaneous pneumothorax.

EXAM:
CHEST - 2 VIEW

[dg chest 2 view (1 of 2)]
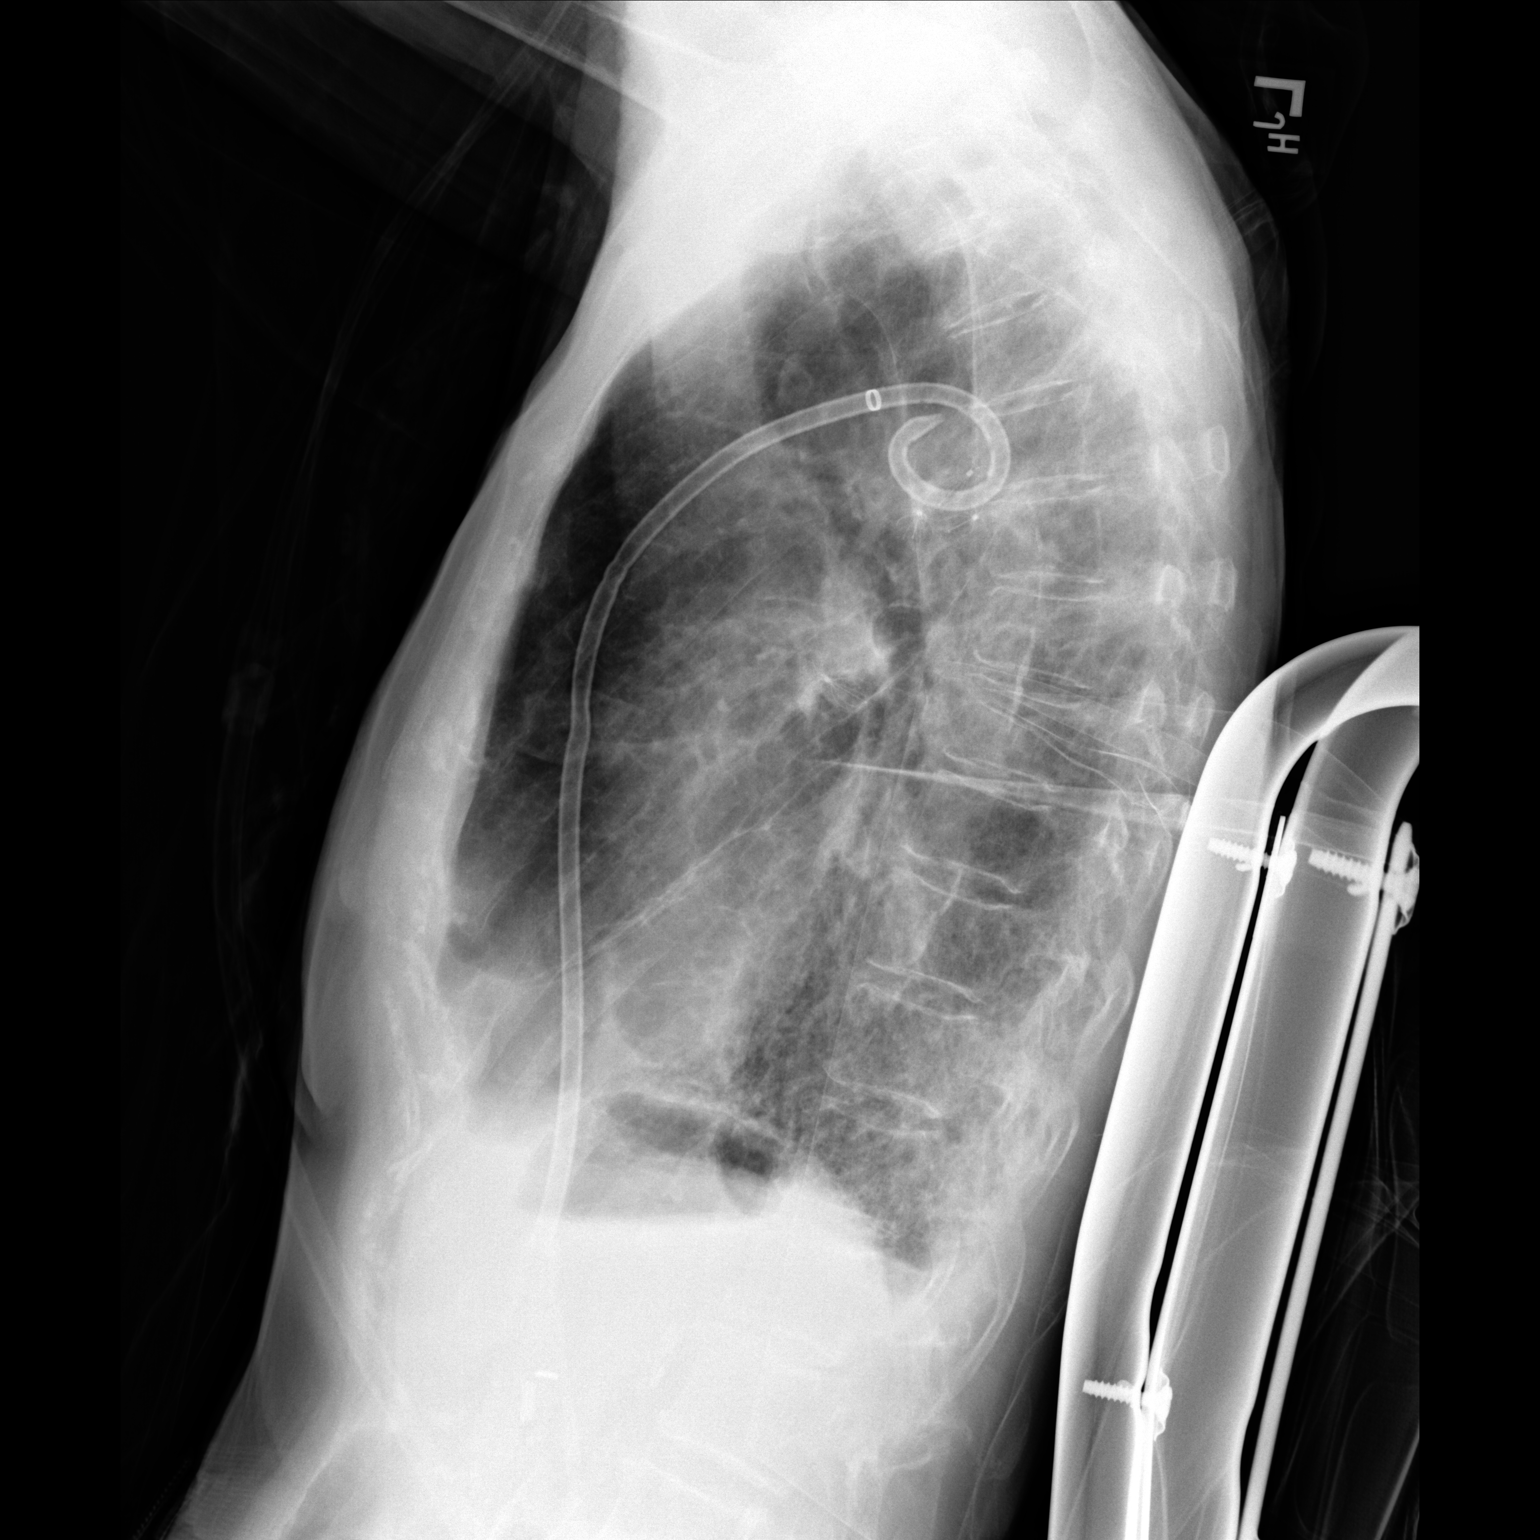

[dg chest 2 view (2 of 2)]
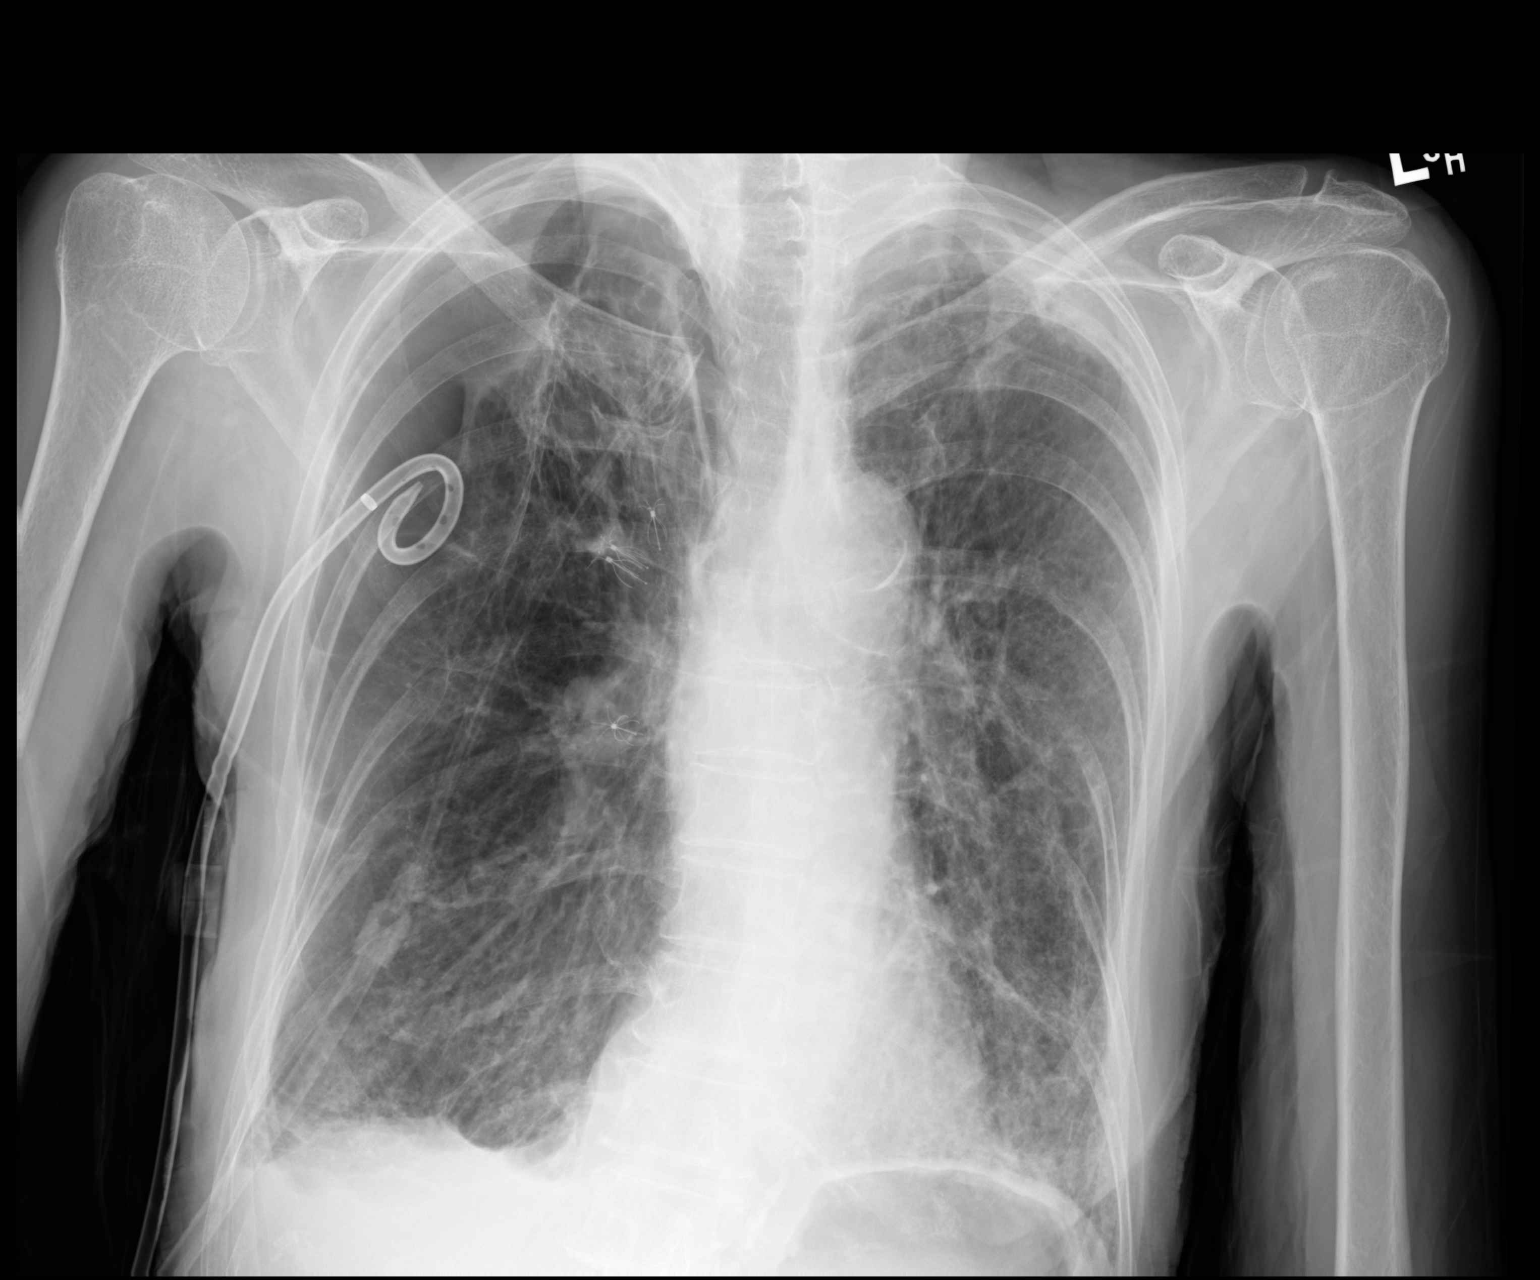

[2 of 2 positions shown; findings below may reference images not displayed]

FINDINGS: The heart size and mediastinal contours are within normal limits.
Stable position of right-sided chest tube. Stable appearance of
right apical pneumothorax. Stable interstitial densities are noted
throughout both lungs most consistent with chronic interstitial lung
disease. No significant changes noted compared to prior exam. The
visualized skeletal structures are unremarkable.
IMPRESSION: Stable appearance of right apical pneumothorax. Stable position of
right-sided chest tube. Stable interstitial densities throughout
both lungs most consistent with chronic interstitial lung disease.

## 2021-09-22 DIAGNOSIS — Z881 Allergy status to other antibiotic agents status: Secondary | ICD-10-CM | POA: Diagnosis not present

## 2021-09-22 DIAGNOSIS — R630 Anorexia: Secondary | ICD-10-CM | POA: Diagnosis not present

## 2021-09-22 DIAGNOSIS — J90 Pleural effusion, not elsewhere classified: Secondary | ICD-10-CM | POA: Diagnosis not present

## 2021-09-22 DIAGNOSIS — R531 Weakness: Secondary | ICD-10-CM | POA: Diagnosis not present

## 2021-09-22 DIAGNOSIS — U071 COVID-19: Secondary | ICD-10-CM | POA: Diagnosis not present

## 2021-09-22 DIAGNOSIS — R069 Unspecified abnormalities of breathing: Secondary | ICD-10-CM | POA: Diagnosis not present

## 2021-09-22 DIAGNOSIS — N3000 Acute cystitis without hematuria: Secondary | ICD-10-CM | POA: Diagnosis not present

## 2021-09-22 DIAGNOSIS — R0602 Shortness of breath: Secondary | ICD-10-CM | POA: Diagnosis not present

## 2021-09-22 DIAGNOSIS — Z882 Allergy status to sulfonamides status: Secondary | ICD-10-CM | POA: Diagnosis not present

## 2021-09-22 DIAGNOSIS — R0600 Dyspnea, unspecified: Secondary | ICD-10-CM | POA: Diagnosis not present

## 2021-09-22 DIAGNOSIS — Z743 Need for continuous supervision: Secondary | ICD-10-CM | POA: Diagnosis not present

## 2021-09-22 DIAGNOSIS — R0689 Other abnormalities of breathing: Secondary | ICD-10-CM | POA: Diagnosis not present

## 2021-09-22 DIAGNOSIS — Z8551 Personal history of malignant neoplasm of bladder: Secondary | ICD-10-CM | POA: Diagnosis not present

## 2021-09-22 DIAGNOSIS — J449 Chronic obstructive pulmonary disease, unspecified: Secondary | ICD-10-CM | POA: Diagnosis not present

## 2021-09-22 DIAGNOSIS — Z8616 Personal history of COVID-19: Secondary | ICD-10-CM | POA: Diagnosis not present

## 2021-09-22 DIAGNOSIS — R0902 Hypoxemia: Secondary | ICD-10-CM | POA: Diagnosis not present

## 2021-09-23 DIAGNOSIS — J90 Pleural effusion, not elsewhere classified: Secondary | ICD-10-CM | POA: Diagnosis not present

## 2021-09-23 DIAGNOSIS — R0602 Shortness of breath: Secondary | ICD-10-CM | POA: Diagnosis not present

## 2021-10-09 DIAGNOSIS — J439 Emphysema, unspecified: Secondary | ICD-10-CM | POA: Diagnosis not present

## 2021-10-09 DIAGNOSIS — R69 Illness, unspecified: Secondary | ICD-10-CM | POA: Diagnosis not present

## 2021-10-09 DIAGNOSIS — R110 Nausea: Secondary | ICD-10-CM | POA: Diagnosis not present

## 2021-10-09 DIAGNOSIS — M549 Dorsalgia, unspecified: Secondary | ICD-10-CM | POA: Diagnosis not present

## 2021-10-09 DIAGNOSIS — K219 Gastro-esophageal reflux disease without esophagitis: Secondary | ICD-10-CM | POA: Diagnosis not present

## 2021-10-14 DIAGNOSIS — J439 Emphysema, unspecified: Secondary | ICD-10-CM | POA: Diagnosis not present

## 2021-10-14 DIAGNOSIS — J449 Chronic obstructive pulmonary disease, unspecified: Secondary | ICD-10-CM | POA: Diagnosis not present

## 2021-10-17 IMAGING — DX DG CHEST 1V PORT
1 series · 1 of 1 positions shown · non-contrast
Comparison: 11/29/2019

CLINICAL DATA: Evaluate right-sided pneumothorax. Chest tube in
place.

EXAM:
PORTABLE CHEST 1 VIEW

[chest ap]
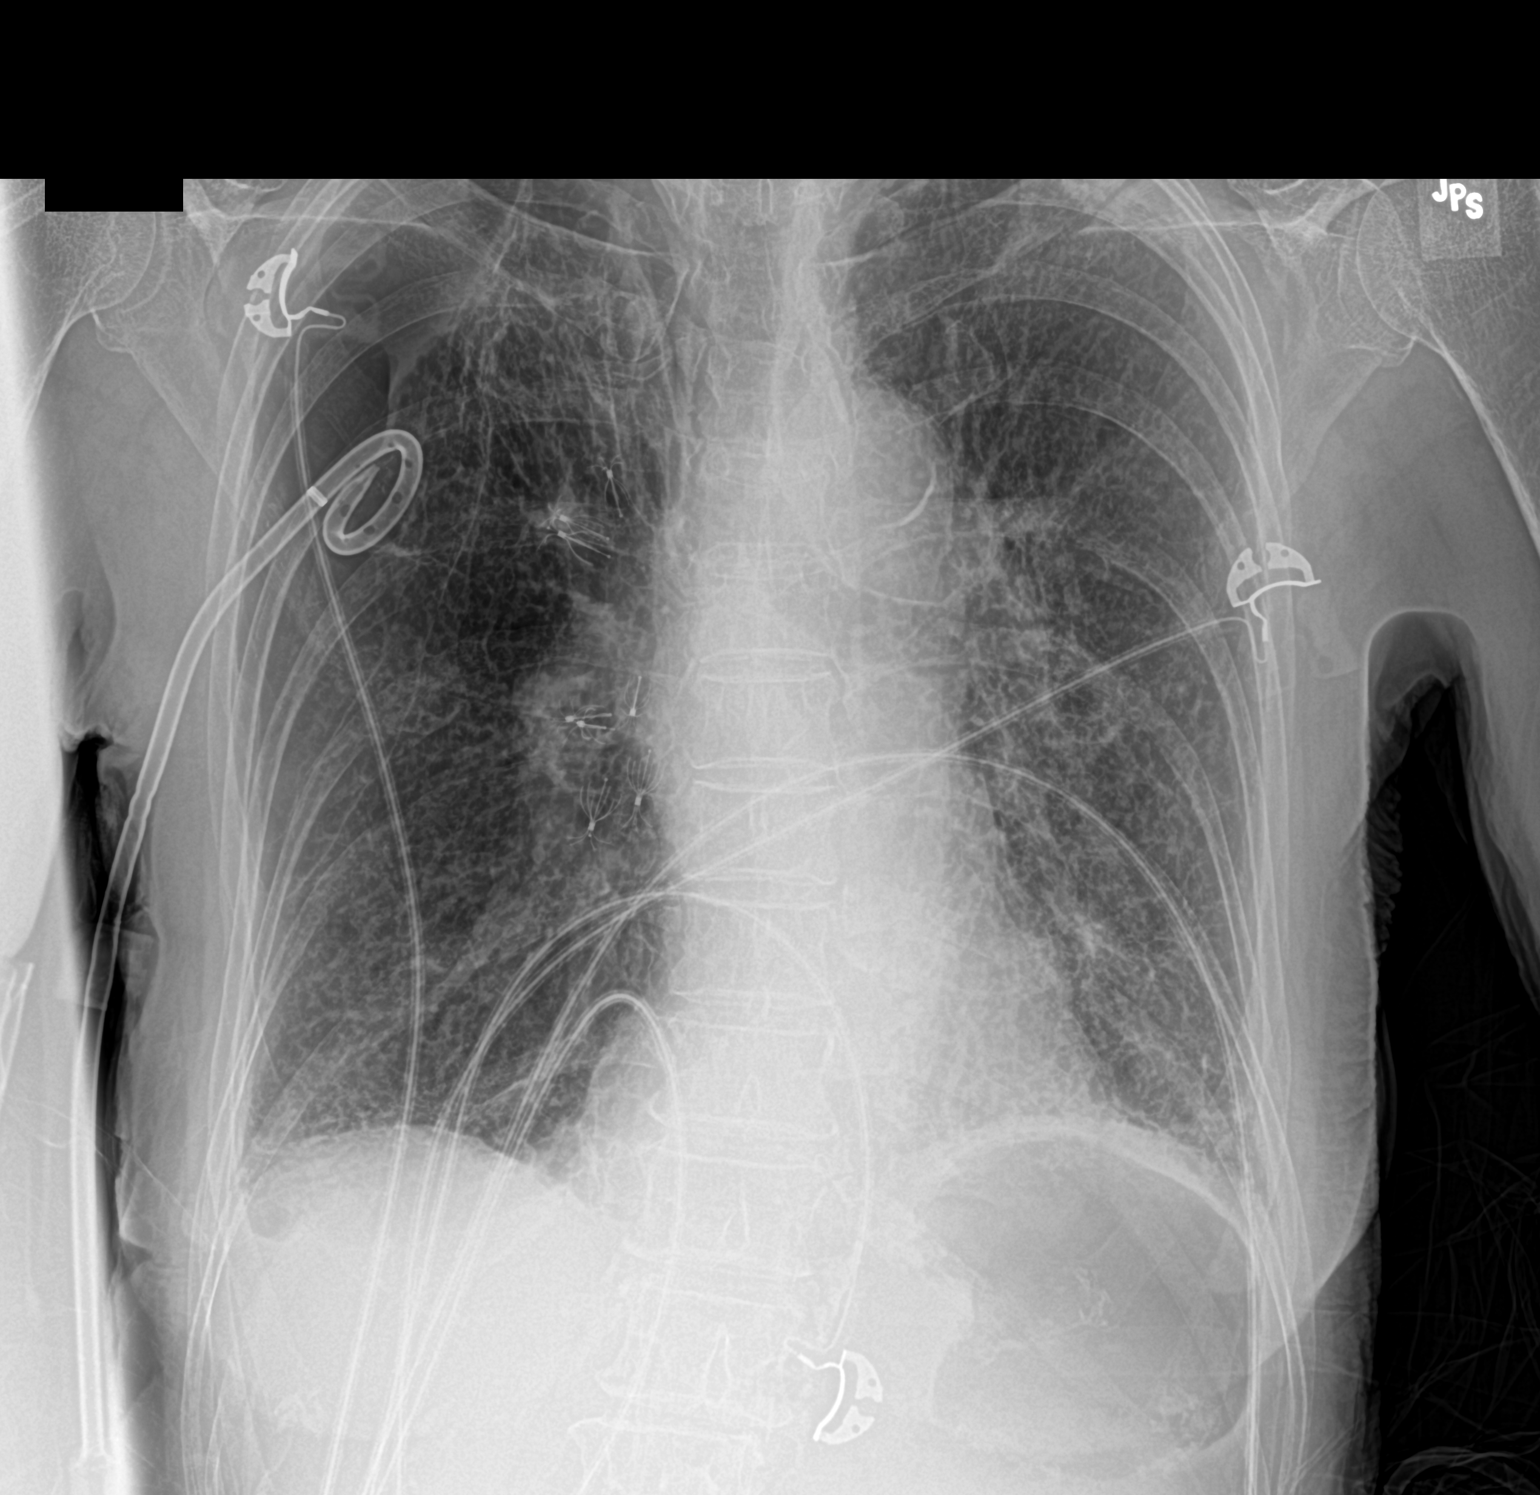

[1 of 1 positions shown; findings below may reference images not displayed]

FINDINGS: Normal heart size. Aortic atherosclerosis. Right-sided chest tube is
in place and appears unchanged in position from previous exam.
Unchanged appearance of moderate right pneumothorax overlying the
right upper lobe and apex. Diffuse fine reticular opacities
throughout both lungs are identified. No superimposed airspace
consolidation
IMPRESSION: 1. Right chest tube in place. Stable moderate pneumothorax overlying
the right upper lobe.
2. No change in aeration a lungs compared with previous exam.
3.  Aortic Atherosclerosis ([id number]).

## 2021-11-14 DIAGNOSIS — J439 Emphysema, unspecified: Secondary | ICD-10-CM | POA: Diagnosis not present

## 2021-11-14 DIAGNOSIS — J449 Chronic obstructive pulmonary disease, unspecified: Secondary | ICD-10-CM | POA: Diagnosis not present

## 2021-11-25 IMAGING — DX DG CHEST 2V
2 series · 2 of 2 positions shown · non-contrast
Comparison: 01/01/2020

CLINICAL DATA: Right pneumothorax

EXAM:
CHEST - 2 VIEW

[dg chest 2 view (1 of 2)]
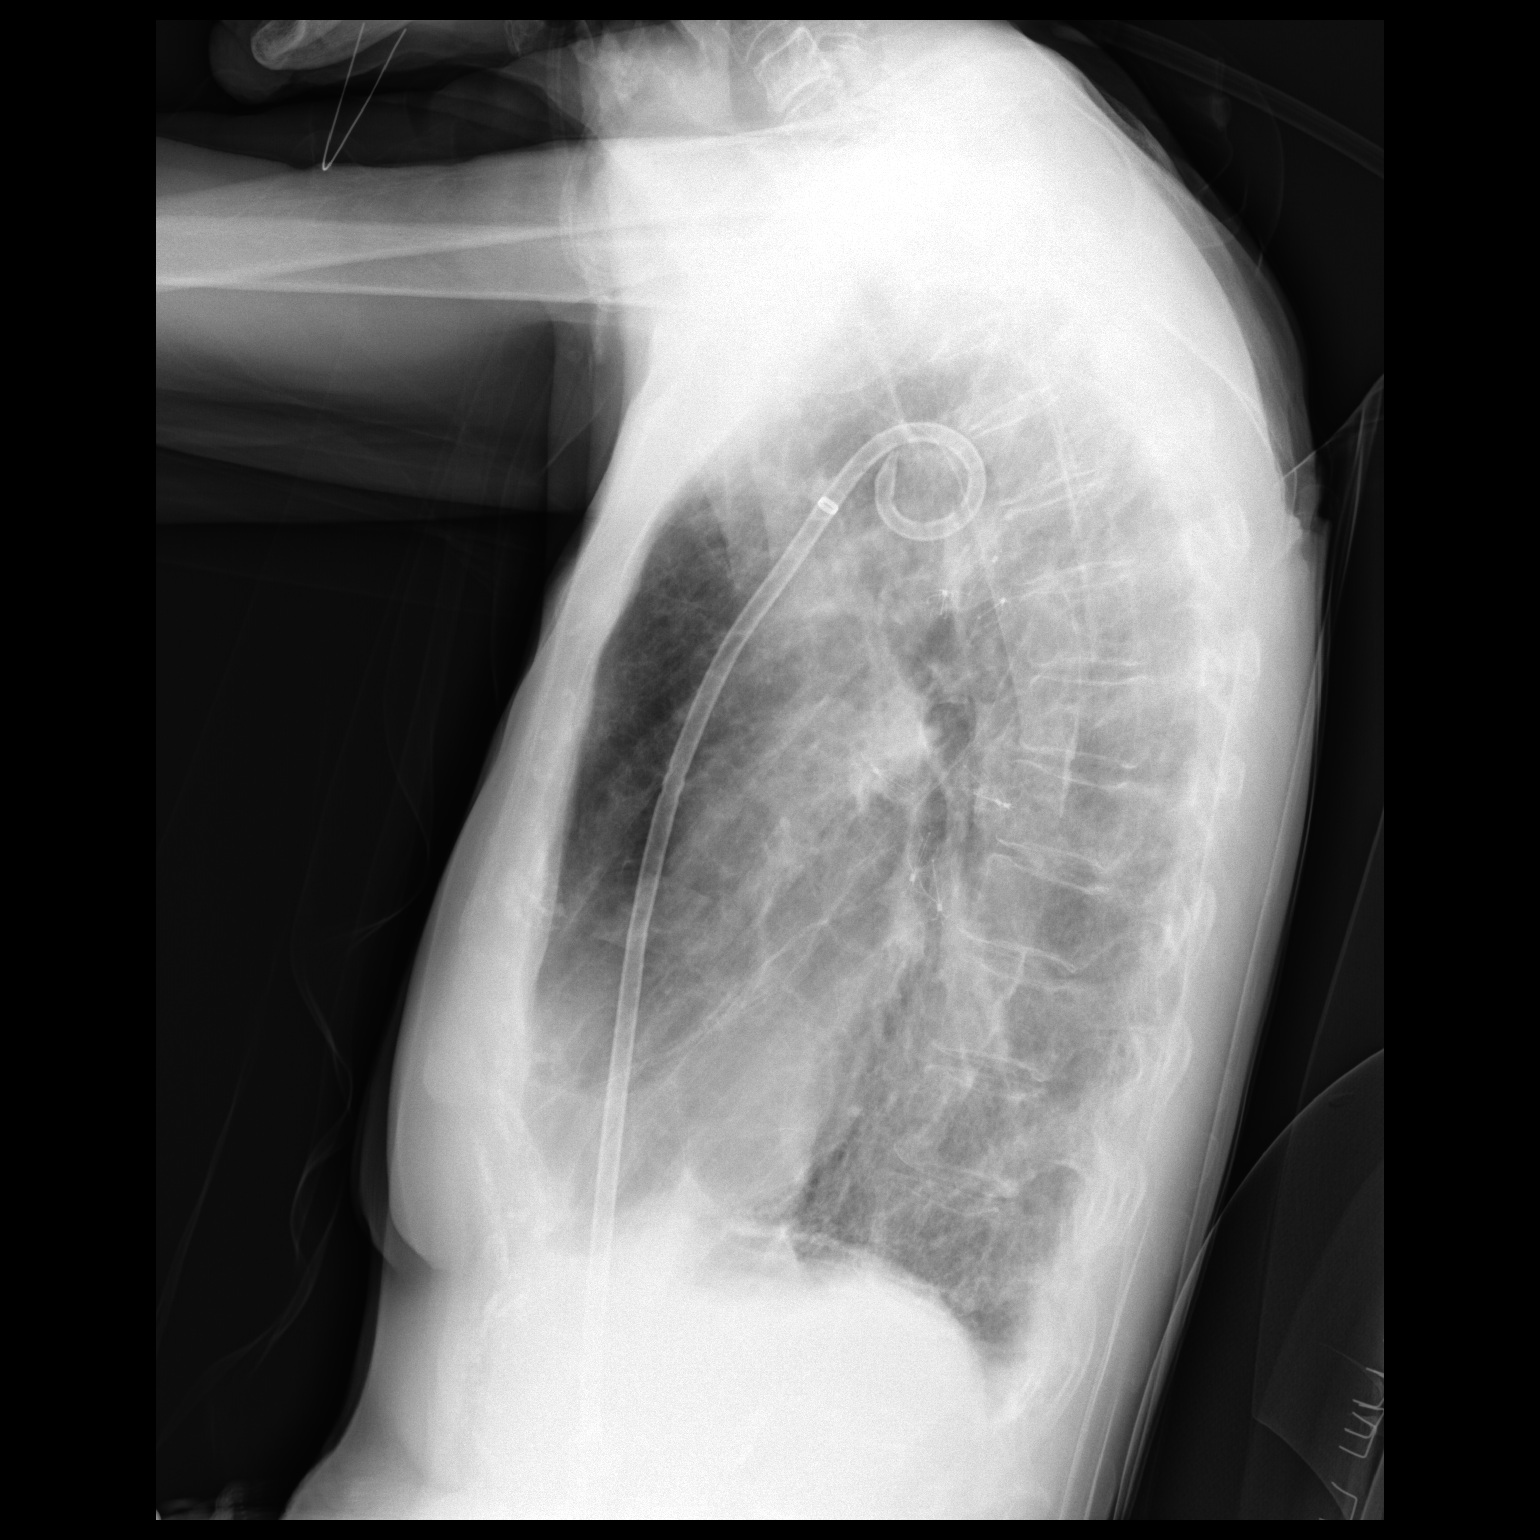

[dg chest 2 view (2 of 2)]
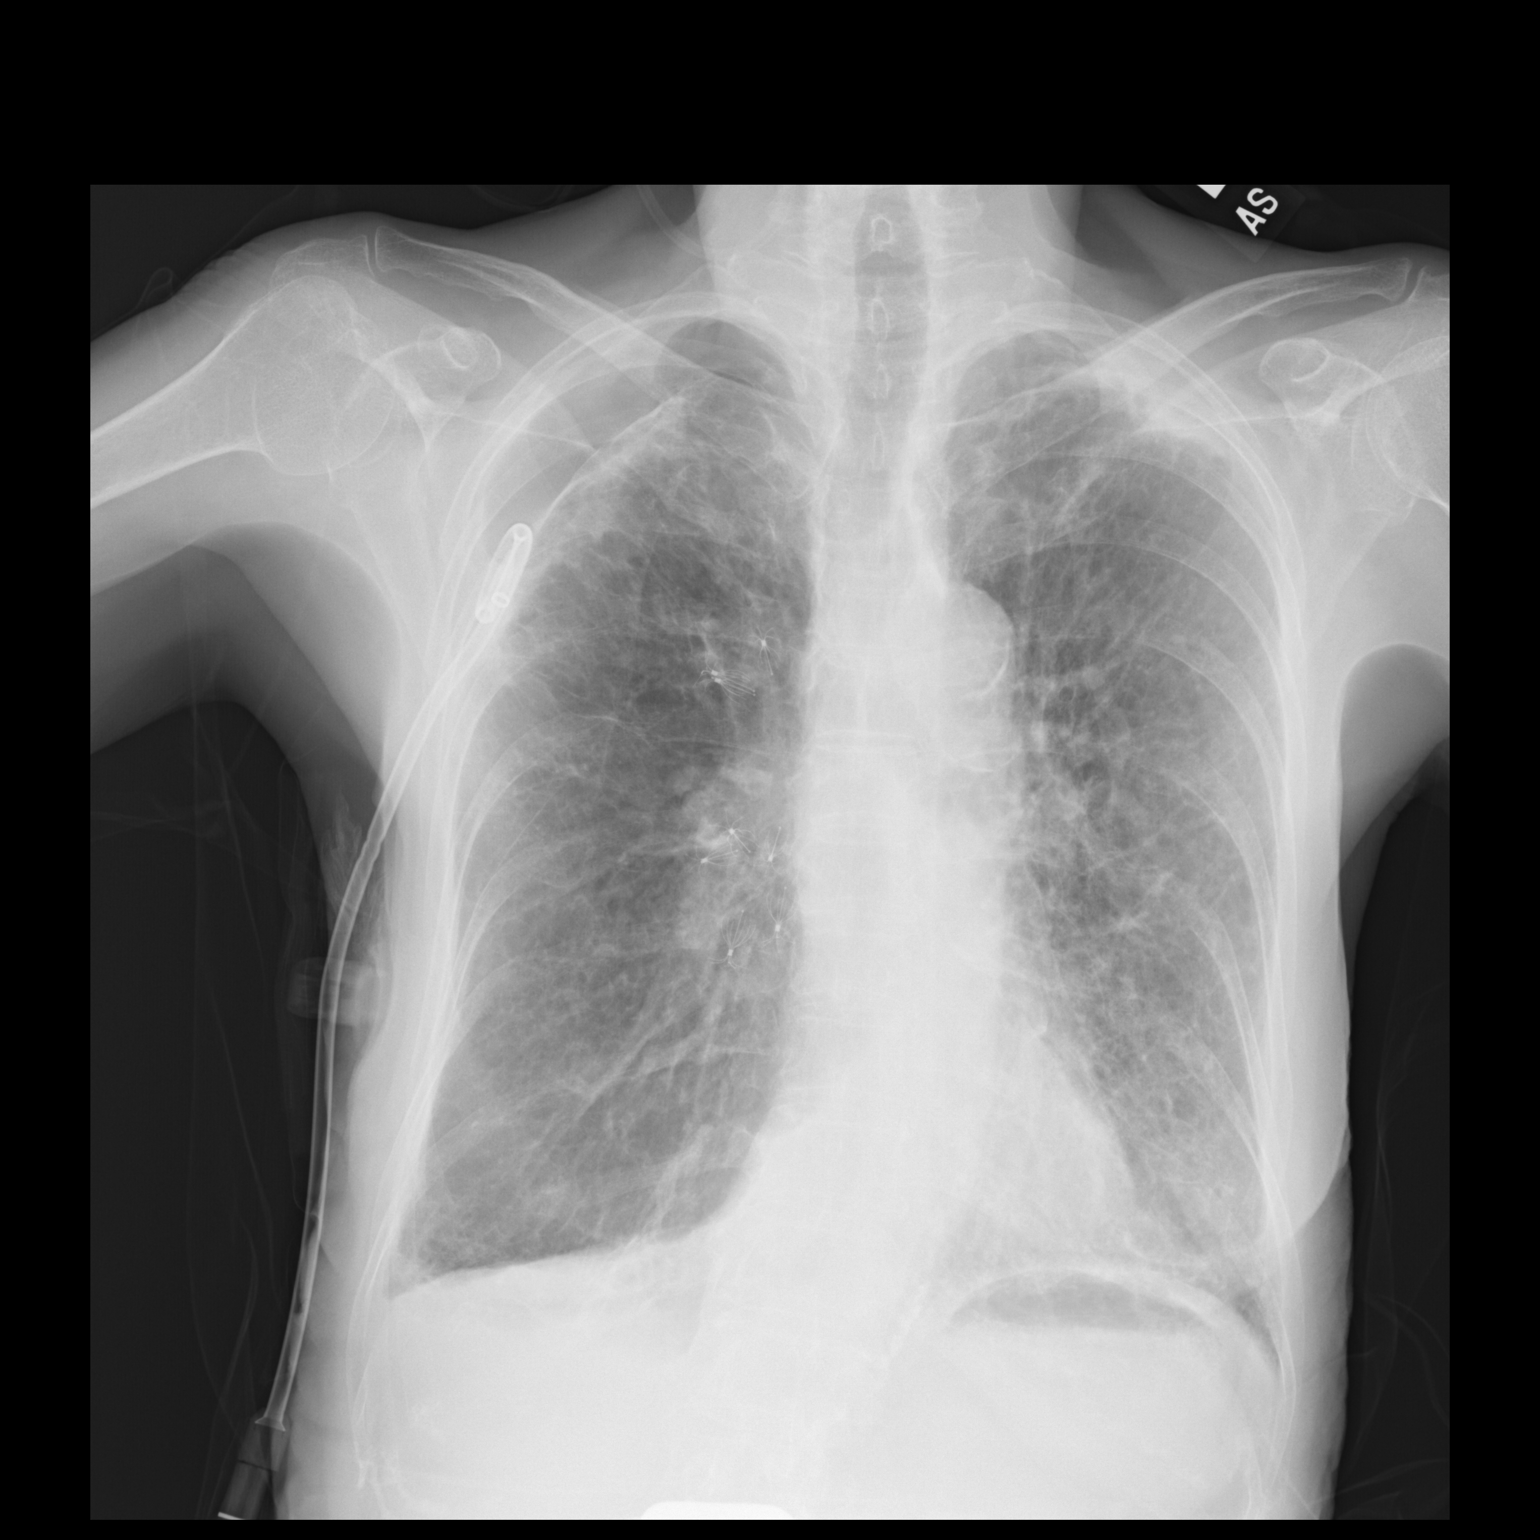

[2 of 2 positions shown; findings below may reference images not displayed]

FINDINGS: Normal heart size. Atherosclerotic calcification of the aortic knob.
Chronic bronchitic and emphysematous changes to both lungs. Multiple
right-sided endobronchial valves again noted. Similar appearance of
small right apical pneumothorax. Pigtail right pleural drainage
catheter remains in place. No pleural effusion. Chronic left
pleuroparenchymal thickening.
IMPRESSION: Stable small right apical pneumothorax. Pigtail right pleural
drainage catheter remains in place.

These results will be called to the ordering clinician or
representative by the Radiologist Assistant, and communication
documented in the PACS or [REDACTED].

## 2021-12-12 DIAGNOSIS — J449 Chronic obstructive pulmonary disease, unspecified: Secondary | ICD-10-CM | POA: Diagnosis not present

## 2021-12-12 DIAGNOSIS — J439 Emphysema, unspecified: Secondary | ICD-10-CM | POA: Diagnosis not present

## 2021-12-18 DIAGNOSIS — J9610 Chronic respiratory failure, unspecified whether with hypoxia or hypercapnia: Secondary | ICD-10-CM | POA: Diagnosis not present

## 2021-12-18 DIAGNOSIS — M810 Age-related osteoporosis without current pathological fracture: Secondary | ICD-10-CM | POA: Diagnosis not present

## 2021-12-18 DIAGNOSIS — I739 Peripheral vascular disease, unspecified: Secondary | ICD-10-CM | POA: Diagnosis not present

## 2021-12-18 DIAGNOSIS — Z803 Family history of malignant neoplasm of breast: Secondary | ICD-10-CM | POA: Diagnosis not present

## 2021-12-18 DIAGNOSIS — Z7983 Long term (current) use of bisphosphonates: Secondary | ICD-10-CM | POA: Diagnosis not present

## 2021-12-18 DIAGNOSIS — G8929 Other chronic pain: Secondary | ICD-10-CM | POA: Diagnosis not present

## 2021-12-18 DIAGNOSIS — Z008 Encounter for other general examination: Secondary | ICD-10-CM | POA: Diagnosis not present

## 2021-12-18 DIAGNOSIS — Z823 Family history of stroke: Secondary | ICD-10-CM | POA: Diagnosis not present

## 2021-12-18 DIAGNOSIS — M1990 Unspecified osteoarthritis, unspecified site: Secondary | ICD-10-CM | POA: Diagnosis not present

## 2021-12-18 DIAGNOSIS — R69 Illness, unspecified: Secondary | ICD-10-CM | POA: Diagnosis not present

## 2021-12-18 DIAGNOSIS — J439 Emphysema, unspecified: Secondary | ICD-10-CM | POA: Diagnosis not present

## 2021-12-18 DIAGNOSIS — R636 Underweight: Secondary | ICD-10-CM | POA: Diagnosis not present

## 2021-12-18 DIAGNOSIS — Z681 Body mass index (BMI) 19 or less, adult: Secondary | ICD-10-CM | POA: Diagnosis not present

## 2022-01-06 DIAGNOSIS — J329 Chronic sinusitis, unspecified: Secondary | ICD-10-CM | POA: Diagnosis not present

## 2022-01-08 DIAGNOSIS — R112 Nausea with vomiting, unspecified: Secondary | ICD-10-CM | POA: Diagnosis not present

## 2022-01-12 DIAGNOSIS — J449 Chronic obstructive pulmonary disease, unspecified: Secondary | ICD-10-CM | POA: Diagnosis not present

## 2022-01-12 DIAGNOSIS — J439 Emphysema, unspecified: Secondary | ICD-10-CM | POA: Diagnosis not present

## 2022-01-17 DIAGNOSIS — N1831 Chronic kidney disease, stage 3a: Secondary | ICD-10-CM | POA: Diagnosis not present

## 2022-01-17 DIAGNOSIS — Z885 Allergy status to narcotic agent status: Secondary | ICD-10-CM | POA: Diagnosis not present

## 2022-01-17 DIAGNOSIS — R0902 Hypoxemia: Secondary | ICD-10-CM | POA: Diagnosis not present

## 2022-01-17 DIAGNOSIS — Z20822 Contact with and (suspected) exposure to covid-19: Secondary | ICD-10-CM | POA: Diagnosis not present

## 2022-01-17 DIAGNOSIS — Z7952 Long term (current) use of systemic steroids: Secondary | ICD-10-CM | POA: Diagnosis not present

## 2022-01-17 DIAGNOSIS — M810 Age-related osteoporosis without current pathological fracture: Secondary | ICD-10-CM | POA: Diagnosis not present

## 2022-01-17 DIAGNOSIS — J9621 Acute and chronic respiratory failure with hypoxia: Secondary | ICD-10-CM | POA: Diagnosis not present

## 2022-01-17 DIAGNOSIS — Z886 Allergy status to analgesic agent status: Secondary | ICD-10-CM | POA: Diagnosis not present

## 2022-01-17 DIAGNOSIS — Z792 Long term (current) use of antibiotics: Secondary | ICD-10-CM | POA: Diagnosis not present

## 2022-01-17 DIAGNOSIS — J449 Chronic obstructive pulmonary disease, unspecified: Secondary | ICD-10-CM | POA: Diagnosis not present

## 2022-01-17 DIAGNOSIS — N189 Chronic kidney disease, unspecified: Secondary | ICD-10-CM | POA: Diagnosis not present

## 2022-01-17 DIAGNOSIS — J9611 Chronic respiratory failure with hypoxia: Secondary | ICD-10-CM | POA: Diagnosis not present

## 2022-01-17 DIAGNOSIS — J9601 Acute respiratory failure with hypoxia: Secondary | ICD-10-CM | POA: Diagnosis not present

## 2022-01-17 DIAGNOSIS — Z888 Allergy status to other drugs, medicaments and biological substances status: Secondary | ICD-10-CM | POA: Diagnosis not present

## 2022-01-17 DIAGNOSIS — Z9181 History of falling: Secondary | ICD-10-CM | POA: Diagnosis not present

## 2022-01-17 DIAGNOSIS — Z9981 Dependence on supplemental oxygen: Secondary | ICD-10-CM | POA: Diagnosis not present

## 2022-01-17 DIAGNOSIS — Z880 Allergy status to penicillin: Secondary | ICD-10-CM | POA: Diagnosis not present

## 2022-01-17 DIAGNOSIS — X58XXXA Exposure to other specified factors, initial encounter: Secondary | ICD-10-CM | POA: Diagnosis not present

## 2022-01-17 DIAGNOSIS — I959 Hypotension, unspecified: Secondary | ICD-10-CM | POA: Diagnosis not present

## 2022-01-17 DIAGNOSIS — J189 Pneumonia, unspecified organism: Secondary | ICD-10-CM | POA: Diagnosis not present

## 2022-01-17 DIAGNOSIS — Z79899 Other long term (current) drug therapy: Secondary | ICD-10-CM | POA: Diagnosis not present

## 2022-01-17 DIAGNOSIS — J441 Chronic obstructive pulmonary disease with (acute) exacerbation: Secondary | ICD-10-CM | POA: Diagnosis not present

## 2022-01-17 DIAGNOSIS — Z881 Allergy status to other antibiotic agents status: Secondary | ICD-10-CM | POA: Diagnosis not present

## 2022-01-17 DIAGNOSIS — Z28310 Unvaccinated for covid-19: Secondary | ICD-10-CM | POA: Diagnosis not present

## 2022-01-17 DIAGNOSIS — Z7983 Long term (current) use of bisphosphonates: Secondary | ICD-10-CM | POA: Diagnosis not present

## 2022-01-17 DIAGNOSIS — S22060A Wedge compression fracture of T7-T8 vertebra, initial encounter for closed fracture: Secondary | ICD-10-CM | POA: Diagnosis not present

## 2022-01-17 DIAGNOSIS — Z8551 Personal history of malignant neoplasm of bladder: Secondary | ICD-10-CM | POA: Diagnosis not present

## 2022-01-17 DIAGNOSIS — Z9049 Acquired absence of other specified parts of digestive tract: Secondary | ICD-10-CM | POA: Diagnosis not present

## 2022-01-17 DIAGNOSIS — Z90710 Acquired absence of both cervix and uterus: Secondary | ICD-10-CM | POA: Diagnosis not present

## 2022-01-17 DIAGNOSIS — Z87891 Personal history of nicotine dependence: Secondary | ICD-10-CM | POA: Diagnosis not present

## 2022-01-17 DIAGNOSIS — R0602 Shortness of breath: Secondary | ICD-10-CM | POA: Diagnosis not present

## 2022-01-17 DIAGNOSIS — Z882 Allergy status to sulfonamides status: Secondary | ICD-10-CM | POA: Diagnosis not present

## 2022-01-17 DIAGNOSIS — W19XXXA Unspecified fall, initial encounter: Secondary | ICD-10-CM | POA: Diagnosis not present

## 2022-01-17 DIAGNOSIS — S22060D Wedge compression fracture of T7-T8 vertebra, subsequent encounter for fracture with routine healing: Secondary | ICD-10-CM | POA: Diagnosis not present

## 2022-01-18 DIAGNOSIS — J441 Chronic obstructive pulmonary disease with (acute) exacerbation: Secondary | ICD-10-CM | POA: Diagnosis not present

## 2022-01-18 DIAGNOSIS — J9611 Chronic respiratory failure with hypoxia: Secondary | ICD-10-CM | POA: Diagnosis not present

## 2022-01-18 DIAGNOSIS — S22060D Wedge compression fracture of T7-T8 vertebra, subsequent encounter for fracture with routine healing: Secondary | ICD-10-CM | POA: Diagnosis not present

## 2022-01-18 DIAGNOSIS — Z9981 Dependence on supplemental oxygen: Secondary | ICD-10-CM | POA: Diagnosis not present

## 2022-01-18 DIAGNOSIS — Z7952 Long term (current) use of systemic steroids: Secondary | ICD-10-CM | POA: Diagnosis not present

## 2022-01-18 DIAGNOSIS — N1831 Chronic kidney disease, stage 3a: Secondary | ICD-10-CM | POA: Diagnosis not present

## 2022-01-18 DIAGNOSIS — Z792 Long term (current) use of antibiotics: Secondary | ICD-10-CM | POA: Diagnosis not present

## 2022-01-18 DIAGNOSIS — I959 Hypotension, unspecified: Secondary | ICD-10-CM | POA: Diagnosis not present

## 2022-01-19 DIAGNOSIS — J441 Chronic obstructive pulmonary disease with (acute) exacerbation: Secondary | ICD-10-CM | POA: Diagnosis not present

## 2022-01-19 DIAGNOSIS — J9611 Chronic respiratory failure with hypoxia: Secondary | ICD-10-CM | POA: Diagnosis not present

## 2022-01-19 DIAGNOSIS — I959 Hypotension, unspecified: Secondary | ICD-10-CM | POA: Diagnosis not present

## 2022-01-19 DIAGNOSIS — N189 Chronic kidney disease, unspecified: Secondary | ICD-10-CM | POA: Diagnosis not present

## 2022-01-21 DIAGNOSIS — Z7952 Long term (current) use of systemic steroids: Secondary | ICD-10-CM | POA: Diagnosis not present

## 2022-01-21 DIAGNOSIS — Z9981 Dependence on supplemental oxygen: Secondary | ICD-10-CM | POA: Diagnosis not present

## 2022-01-21 DIAGNOSIS — M810 Age-related osteoporosis without current pathological fracture: Secondary | ICD-10-CM | POA: Diagnosis not present

## 2022-01-21 DIAGNOSIS — Z9181 History of falling: Secondary | ICD-10-CM | POA: Diagnosis not present

## 2022-01-21 DIAGNOSIS — Z8616 Personal history of COVID-19: Secondary | ICD-10-CM | POA: Diagnosis not present

## 2022-01-21 DIAGNOSIS — I700 Atherosclerosis of aorta: Secondary | ICD-10-CM | POA: Diagnosis not present

## 2022-01-21 DIAGNOSIS — J440 Chronic obstructive pulmonary disease with acute lower respiratory infection: Secondary | ICD-10-CM | POA: Diagnosis not present

## 2022-01-21 DIAGNOSIS — Z87891 Personal history of nicotine dependence: Secondary | ICD-10-CM | POA: Diagnosis not present

## 2022-01-21 DIAGNOSIS — Z8551 Personal history of malignant neoplasm of bladder: Secondary | ICD-10-CM | POA: Diagnosis not present

## 2022-01-21 DIAGNOSIS — J189 Pneumonia, unspecified organism: Secondary | ICD-10-CM | POA: Diagnosis not present

## 2022-01-21 DIAGNOSIS — N189 Chronic kidney disease, unspecified: Secondary | ICD-10-CM | POA: Diagnosis not present

## 2022-01-21 DIAGNOSIS — J441 Chronic obstructive pulmonary disease with (acute) exacerbation: Secondary | ICD-10-CM | POA: Diagnosis not present

## 2022-01-21 DIAGNOSIS — J9621 Acute and chronic respiratory failure with hypoxia: Secondary | ICD-10-CM | POA: Diagnosis not present

## 2022-01-21 DIAGNOSIS — Z7983 Long term (current) use of bisphosphonates: Secondary | ICD-10-CM | POA: Diagnosis not present

## 2022-01-21 DIAGNOSIS — I959 Hypotension, unspecified: Secondary | ICD-10-CM | POA: Diagnosis not present

## 2022-01-23 DIAGNOSIS — N189 Chronic kidney disease, unspecified: Secondary | ICD-10-CM | POA: Diagnosis not present

## 2022-01-23 DIAGNOSIS — I959 Hypotension, unspecified: Secondary | ICD-10-CM | POA: Diagnosis not present

## 2022-01-23 DIAGNOSIS — J189 Pneumonia, unspecified organism: Secondary | ICD-10-CM | POA: Diagnosis not present

## 2022-01-23 DIAGNOSIS — Z8616 Personal history of COVID-19: Secondary | ICD-10-CM | POA: Diagnosis not present

## 2022-01-23 DIAGNOSIS — M810 Age-related osteoporosis without current pathological fracture: Secondary | ICD-10-CM | POA: Diagnosis not present

## 2022-01-23 DIAGNOSIS — Z7952 Long term (current) use of systemic steroids: Secondary | ICD-10-CM | POA: Diagnosis not present

## 2022-01-23 DIAGNOSIS — Z8551 Personal history of malignant neoplasm of bladder: Secondary | ICD-10-CM | POA: Diagnosis not present

## 2022-01-23 DIAGNOSIS — Z7983 Long term (current) use of bisphosphonates: Secondary | ICD-10-CM | POA: Diagnosis not present

## 2022-01-23 DIAGNOSIS — I700 Atherosclerosis of aorta: Secondary | ICD-10-CM | POA: Diagnosis not present

## 2022-01-23 DIAGNOSIS — J9621 Acute and chronic respiratory failure with hypoxia: Secondary | ICD-10-CM | POA: Diagnosis not present

## 2022-01-23 DIAGNOSIS — Z9181 History of falling: Secondary | ICD-10-CM | POA: Diagnosis not present

## 2022-01-23 DIAGNOSIS — Z9981 Dependence on supplemental oxygen: Secondary | ICD-10-CM | POA: Diagnosis not present

## 2022-01-23 DIAGNOSIS — J441 Chronic obstructive pulmonary disease with (acute) exacerbation: Secondary | ICD-10-CM | POA: Diagnosis not present

## 2022-01-23 DIAGNOSIS — J440 Chronic obstructive pulmonary disease with acute lower respiratory infection: Secondary | ICD-10-CM | POA: Diagnosis not present

## 2022-01-23 DIAGNOSIS — Z87891 Personal history of nicotine dependence: Secondary | ICD-10-CM | POA: Diagnosis not present

## 2022-01-24 DIAGNOSIS — Z8551 Personal history of malignant neoplasm of bladder: Secondary | ICD-10-CM | POA: Diagnosis not present

## 2022-01-24 DIAGNOSIS — Z7952 Long term (current) use of systemic steroids: Secondary | ICD-10-CM | POA: Diagnosis not present

## 2022-01-24 DIAGNOSIS — Z9981 Dependence on supplemental oxygen: Secondary | ICD-10-CM | POA: Diagnosis not present

## 2022-01-24 DIAGNOSIS — M810 Age-related osteoporosis without current pathological fracture: Secondary | ICD-10-CM | POA: Diagnosis not present

## 2022-01-24 DIAGNOSIS — I700 Atherosclerosis of aorta: Secondary | ICD-10-CM | POA: Diagnosis not present

## 2022-01-24 DIAGNOSIS — Z87891 Personal history of nicotine dependence: Secondary | ICD-10-CM | POA: Diagnosis not present

## 2022-01-24 DIAGNOSIS — Z7983 Long term (current) use of bisphosphonates: Secondary | ICD-10-CM | POA: Diagnosis not present

## 2022-01-24 DIAGNOSIS — I959 Hypotension, unspecified: Secondary | ICD-10-CM | POA: Diagnosis not present

## 2022-01-24 DIAGNOSIS — J189 Pneumonia, unspecified organism: Secondary | ICD-10-CM | POA: Diagnosis not present

## 2022-01-24 DIAGNOSIS — Z9181 History of falling: Secondary | ICD-10-CM | POA: Diagnosis not present

## 2022-01-24 DIAGNOSIS — Z8616 Personal history of COVID-19: Secondary | ICD-10-CM | POA: Diagnosis not present

## 2022-01-24 DIAGNOSIS — J440 Chronic obstructive pulmonary disease with acute lower respiratory infection: Secondary | ICD-10-CM | POA: Diagnosis not present

## 2022-01-24 DIAGNOSIS — N189 Chronic kidney disease, unspecified: Secondary | ICD-10-CM | POA: Diagnosis not present

## 2022-01-24 DIAGNOSIS — J441 Chronic obstructive pulmonary disease with (acute) exacerbation: Secondary | ICD-10-CM | POA: Diagnosis not present

## 2022-01-24 DIAGNOSIS — J9621 Acute and chronic respiratory failure with hypoxia: Secondary | ICD-10-CM | POA: Diagnosis not present

## 2022-01-28 DIAGNOSIS — J9621 Acute and chronic respiratory failure with hypoxia: Secondary | ICD-10-CM | POA: Diagnosis not present

## 2022-01-28 DIAGNOSIS — Z9981 Dependence on supplemental oxygen: Secondary | ICD-10-CM | POA: Diagnosis not present

## 2022-01-28 DIAGNOSIS — Z87891 Personal history of nicotine dependence: Secondary | ICD-10-CM | POA: Diagnosis not present

## 2022-01-28 DIAGNOSIS — J440 Chronic obstructive pulmonary disease with acute lower respiratory infection: Secondary | ICD-10-CM | POA: Diagnosis not present

## 2022-01-28 DIAGNOSIS — I959 Hypotension, unspecified: Secondary | ICD-10-CM | POA: Diagnosis not present

## 2022-01-28 DIAGNOSIS — Z7983 Long term (current) use of bisphosphonates: Secondary | ICD-10-CM | POA: Diagnosis not present

## 2022-01-28 DIAGNOSIS — Z8551 Personal history of malignant neoplasm of bladder: Secondary | ICD-10-CM | POA: Diagnosis not present

## 2022-01-28 DIAGNOSIS — Z8616 Personal history of COVID-19: Secondary | ICD-10-CM | POA: Diagnosis not present

## 2022-01-28 DIAGNOSIS — J189 Pneumonia, unspecified organism: Secondary | ICD-10-CM | POA: Diagnosis not present

## 2022-01-28 DIAGNOSIS — Z7952 Long term (current) use of systemic steroids: Secondary | ICD-10-CM | POA: Diagnosis not present

## 2022-01-28 DIAGNOSIS — M810 Age-related osteoporosis without current pathological fracture: Secondary | ICD-10-CM | POA: Diagnosis not present

## 2022-01-28 DIAGNOSIS — N189 Chronic kidney disease, unspecified: Secondary | ICD-10-CM | POA: Diagnosis not present

## 2022-01-28 DIAGNOSIS — I700 Atherosclerosis of aorta: Secondary | ICD-10-CM | POA: Diagnosis not present

## 2022-01-28 DIAGNOSIS — J441 Chronic obstructive pulmonary disease with (acute) exacerbation: Secondary | ICD-10-CM | POA: Diagnosis not present

## 2022-01-28 DIAGNOSIS — Z9181 History of falling: Secondary | ICD-10-CM | POA: Diagnosis not present

## 2022-01-29 DIAGNOSIS — Z87891 Personal history of nicotine dependence: Secondary | ICD-10-CM | POA: Diagnosis not present

## 2022-01-29 DIAGNOSIS — Z7983 Long term (current) use of bisphosphonates: Secondary | ICD-10-CM | POA: Diagnosis not present

## 2022-01-29 DIAGNOSIS — Z8551 Personal history of malignant neoplasm of bladder: Secondary | ICD-10-CM | POA: Diagnosis not present

## 2022-01-29 DIAGNOSIS — Z8616 Personal history of COVID-19: Secondary | ICD-10-CM | POA: Diagnosis not present

## 2022-01-29 DIAGNOSIS — J441 Chronic obstructive pulmonary disease with (acute) exacerbation: Secondary | ICD-10-CM | POA: Diagnosis not present

## 2022-01-29 DIAGNOSIS — I700 Atherosclerosis of aorta: Secondary | ICD-10-CM | POA: Diagnosis not present

## 2022-01-29 DIAGNOSIS — N189 Chronic kidney disease, unspecified: Secondary | ICD-10-CM | POA: Diagnosis not present

## 2022-01-29 DIAGNOSIS — Z9181 History of falling: Secondary | ICD-10-CM | POA: Diagnosis not present

## 2022-01-29 DIAGNOSIS — J440 Chronic obstructive pulmonary disease with acute lower respiratory infection: Secondary | ICD-10-CM | POA: Diagnosis not present

## 2022-01-29 DIAGNOSIS — J189 Pneumonia, unspecified organism: Secondary | ICD-10-CM | POA: Diagnosis not present

## 2022-01-29 DIAGNOSIS — Z9981 Dependence on supplemental oxygen: Secondary | ICD-10-CM | POA: Diagnosis not present

## 2022-01-29 DIAGNOSIS — I959 Hypotension, unspecified: Secondary | ICD-10-CM | POA: Diagnosis not present

## 2022-01-29 DIAGNOSIS — J9621 Acute and chronic respiratory failure with hypoxia: Secondary | ICD-10-CM | POA: Diagnosis not present

## 2022-01-29 DIAGNOSIS — Z7952 Long term (current) use of systemic steroids: Secondary | ICD-10-CM | POA: Diagnosis not present

## 2022-01-29 DIAGNOSIS — M810 Age-related osteoporosis without current pathological fracture: Secondary | ICD-10-CM | POA: Diagnosis not present

## 2022-01-30 DIAGNOSIS — Z9981 Dependence on supplemental oxygen: Secondary | ICD-10-CM | POA: Diagnosis not present

## 2022-01-30 DIAGNOSIS — M810 Age-related osteoporosis without current pathological fracture: Secondary | ICD-10-CM | POA: Diagnosis not present

## 2022-01-30 DIAGNOSIS — N189 Chronic kidney disease, unspecified: Secondary | ICD-10-CM | POA: Diagnosis not present

## 2022-01-30 DIAGNOSIS — Z8551 Personal history of malignant neoplasm of bladder: Secondary | ICD-10-CM | POA: Diagnosis not present

## 2022-01-30 DIAGNOSIS — I700 Atherosclerosis of aorta: Secondary | ICD-10-CM | POA: Diagnosis not present

## 2022-01-30 DIAGNOSIS — J189 Pneumonia, unspecified organism: Secondary | ICD-10-CM | POA: Diagnosis not present

## 2022-01-30 DIAGNOSIS — J441 Chronic obstructive pulmonary disease with (acute) exacerbation: Secondary | ICD-10-CM | POA: Diagnosis not present

## 2022-01-30 DIAGNOSIS — Z7983 Long term (current) use of bisphosphonates: Secondary | ICD-10-CM | POA: Diagnosis not present

## 2022-01-30 DIAGNOSIS — Z9181 History of falling: Secondary | ICD-10-CM | POA: Diagnosis not present

## 2022-01-30 DIAGNOSIS — Z8616 Personal history of COVID-19: Secondary | ICD-10-CM | POA: Diagnosis not present

## 2022-01-30 DIAGNOSIS — I959 Hypotension, unspecified: Secondary | ICD-10-CM | POA: Diagnosis not present

## 2022-01-30 DIAGNOSIS — J9621 Acute and chronic respiratory failure with hypoxia: Secondary | ICD-10-CM | POA: Diagnosis not present

## 2022-01-30 DIAGNOSIS — Z7952 Long term (current) use of systemic steroids: Secondary | ICD-10-CM | POA: Diagnosis not present

## 2022-01-30 DIAGNOSIS — Z87891 Personal history of nicotine dependence: Secondary | ICD-10-CM | POA: Diagnosis not present

## 2022-01-30 DIAGNOSIS — J440 Chronic obstructive pulmonary disease with acute lower respiratory infection: Secondary | ICD-10-CM | POA: Diagnosis not present

## 2022-01-31 DIAGNOSIS — Z8616 Personal history of COVID-19: Secondary | ICD-10-CM | POA: Diagnosis not present

## 2022-01-31 DIAGNOSIS — N189 Chronic kidney disease, unspecified: Secondary | ICD-10-CM | POA: Diagnosis not present

## 2022-01-31 DIAGNOSIS — Z7983 Long term (current) use of bisphosphonates: Secondary | ICD-10-CM | POA: Diagnosis not present

## 2022-01-31 DIAGNOSIS — M810 Age-related osteoporosis without current pathological fracture: Secondary | ICD-10-CM | POA: Diagnosis not present

## 2022-01-31 DIAGNOSIS — I700 Atherosclerosis of aorta: Secondary | ICD-10-CM | POA: Diagnosis not present

## 2022-01-31 DIAGNOSIS — Z9981 Dependence on supplemental oxygen: Secondary | ICD-10-CM | POA: Diagnosis not present

## 2022-01-31 DIAGNOSIS — J189 Pneumonia, unspecified organism: Secondary | ICD-10-CM | POA: Diagnosis not present

## 2022-01-31 DIAGNOSIS — Z9181 History of falling: Secondary | ICD-10-CM | POA: Diagnosis not present

## 2022-01-31 DIAGNOSIS — J9621 Acute and chronic respiratory failure with hypoxia: Secondary | ICD-10-CM | POA: Diagnosis not present

## 2022-01-31 DIAGNOSIS — J441 Chronic obstructive pulmonary disease with (acute) exacerbation: Secondary | ICD-10-CM | POA: Diagnosis not present

## 2022-01-31 DIAGNOSIS — J440 Chronic obstructive pulmonary disease with acute lower respiratory infection: Secondary | ICD-10-CM | POA: Diagnosis not present

## 2022-01-31 DIAGNOSIS — Z87891 Personal history of nicotine dependence: Secondary | ICD-10-CM | POA: Diagnosis not present

## 2022-01-31 DIAGNOSIS — Z8551 Personal history of malignant neoplasm of bladder: Secondary | ICD-10-CM | POA: Diagnosis not present

## 2022-01-31 DIAGNOSIS — I959 Hypotension, unspecified: Secondary | ICD-10-CM | POA: Diagnosis not present

## 2022-01-31 DIAGNOSIS — Z7952 Long term (current) use of systemic steroids: Secondary | ICD-10-CM | POA: Diagnosis not present

## 2022-02-03 DIAGNOSIS — J9621 Acute and chronic respiratory failure with hypoxia: Secondary | ICD-10-CM | POA: Diagnosis not present

## 2022-02-03 DIAGNOSIS — Z7952 Long term (current) use of systemic steroids: Secondary | ICD-10-CM | POA: Diagnosis not present

## 2022-02-03 DIAGNOSIS — I959 Hypotension, unspecified: Secondary | ICD-10-CM | POA: Diagnosis not present

## 2022-02-03 DIAGNOSIS — Z8616 Personal history of COVID-19: Secondary | ICD-10-CM | POA: Diagnosis not present

## 2022-02-03 DIAGNOSIS — Z7983 Long term (current) use of bisphosphonates: Secondary | ICD-10-CM | POA: Diagnosis not present

## 2022-02-03 DIAGNOSIS — J440 Chronic obstructive pulmonary disease with acute lower respiratory infection: Secondary | ICD-10-CM | POA: Diagnosis not present

## 2022-02-03 DIAGNOSIS — N189 Chronic kidney disease, unspecified: Secondary | ICD-10-CM | POA: Diagnosis not present

## 2022-02-03 DIAGNOSIS — J441 Chronic obstructive pulmonary disease with (acute) exacerbation: Secondary | ICD-10-CM | POA: Diagnosis not present

## 2022-02-03 DIAGNOSIS — M810 Age-related osteoporosis without current pathological fracture: Secondary | ICD-10-CM | POA: Diagnosis not present

## 2022-02-03 DIAGNOSIS — Z9181 History of falling: Secondary | ICD-10-CM | POA: Diagnosis not present

## 2022-02-03 DIAGNOSIS — I700 Atherosclerosis of aorta: Secondary | ICD-10-CM | POA: Diagnosis not present

## 2022-02-03 DIAGNOSIS — J189 Pneumonia, unspecified organism: Secondary | ICD-10-CM | POA: Diagnosis not present

## 2022-02-03 DIAGNOSIS — Z9981 Dependence on supplemental oxygen: Secondary | ICD-10-CM | POA: Diagnosis not present

## 2022-02-03 DIAGNOSIS — Z8551 Personal history of malignant neoplasm of bladder: Secondary | ICD-10-CM | POA: Diagnosis not present

## 2022-02-03 DIAGNOSIS — Z87891 Personal history of nicotine dependence: Secondary | ICD-10-CM | POA: Diagnosis not present

## 2022-02-05 DIAGNOSIS — Z8551 Personal history of malignant neoplasm of bladder: Secondary | ICD-10-CM | POA: Diagnosis not present

## 2022-02-05 DIAGNOSIS — J440 Chronic obstructive pulmonary disease with acute lower respiratory infection: Secondary | ICD-10-CM | POA: Diagnosis not present

## 2022-02-05 DIAGNOSIS — J441 Chronic obstructive pulmonary disease with (acute) exacerbation: Secondary | ICD-10-CM | POA: Diagnosis not present

## 2022-02-05 DIAGNOSIS — Z8616 Personal history of COVID-19: Secondary | ICD-10-CM | POA: Diagnosis not present

## 2022-02-05 DIAGNOSIS — M810 Age-related osteoporosis without current pathological fracture: Secondary | ICD-10-CM | POA: Diagnosis not present

## 2022-02-05 DIAGNOSIS — I959 Hypotension, unspecified: Secondary | ICD-10-CM | POA: Diagnosis not present

## 2022-02-05 DIAGNOSIS — I700 Atherosclerosis of aorta: Secondary | ICD-10-CM | POA: Diagnosis not present

## 2022-02-05 DIAGNOSIS — Z9981 Dependence on supplemental oxygen: Secondary | ICD-10-CM | POA: Diagnosis not present

## 2022-02-05 DIAGNOSIS — Z87891 Personal history of nicotine dependence: Secondary | ICD-10-CM | POA: Diagnosis not present

## 2022-02-05 DIAGNOSIS — J9621 Acute and chronic respiratory failure with hypoxia: Secondary | ICD-10-CM | POA: Diagnosis not present

## 2022-02-05 DIAGNOSIS — N189 Chronic kidney disease, unspecified: Secondary | ICD-10-CM | POA: Diagnosis not present

## 2022-02-05 DIAGNOSIS — Z7952 Long term (current) use of systemic steroids: Secondary | ICD-10-CM | POA: Diagnosis not present

## 2022-02-05 DIAGNOSIS — Z9181 History of falling: Secondary | ICD-10-CM | POA: Diagnosis not present

## 2022-02-05 DIAGNOSIS — J189 Pneumonia, unspecified organism: Secondary | ICD-10-CM | POA: Diagnosis not present

## 2022-02-05 DIAGNOSIS — Z7983 Long term (current) use of bisphosphonates: Secondary | ICD-10-CM | POA: Diagnosis not present

## 2022-02-07 DIAGNOSIS — Z7983 Long term (current) use of bisphosphonates: Secondary | ICD-10-CM | POA: Diagnosis not present

## 2022-02-07 DIAGNOSIS — Z8616 Personal history of COVID-19: Secondary | ICD-10-CM | POA: Diagnosis not present

## 2022-02-07 DIAGNOSIS — Z9981 Dependence on supplemental oxygen: Secondary | ICD-10-CM | POA: Diagnosis not present

## 2022-02-07 DIAGNOSIS — J440 Chronic obstructive pulmonary disease with acute lower respiratory infection: Secondary | ICD-10-CM | POA: Diagnosis not present

## 2022-02-07 DIAGNOSIS — Z7952 Long term (current) use of systemic steroids: Secondary | ICD-10-CM | POA: Diagnosis not present

## 2022-02-07 DIAGNOSIS — J441 Chronic obstructive pulmonary disease with (acute) exacerbation: Secondary | ICD-10-CM | POA: Diagnosis not present

## 2022-02-07 DIAGNOSIS — Z8551 Personal history of malignant neoplasm of bladder: Secondary | ICD-10-CM | POA: Diagnosis not present

## 2022-02-07 DIAGNOSIS — I959 Hypotension, unspecified: Secondary | ICD-10-CM | POA: Diagnosis not present

## 2022-02-07 DIAGNOSIS — J9621 Acute and chronic respiratory failure with hypoxia: Secondary | ICD-10-CM | POA: Diagnosis not present

## 2022-02-07 DIAGNOSIS — N189 Chronic kidney disease, unspecified: Secondary | ICD-10-CM | POA: Diagnosis not present

## 2022-02-07 DIAGNOSIS — M810 Age-related osteoporosis without current pathological fracture: Secondary | ICD-10-CM | POA: Diagnosis not present

## 2022-02-07 DIAGNOSIS — Z87891 Personal history of nicotine dependence: Secondary | ICD-10-CM | POA: Diagnosis not present

## 2022-02-07 DIAGNOSIS — I700 Atherosclerosis of aorta: Secondary | ICD-10-CM | POA: Diagnosis not present

## 2022-02-07 DIAGNOSIS — Z9181 History of falling: Secondary | ICD-10-CM | POA: Diagnosis not present

## 2022-02-07 DIAGNOSIS — J189 Pneumonia, unspecified organism: Secondary | ICD-10-CM | POA: Diagnosis not present

## 2022-02-10 DIAGNOSIS — J189 Pneumonia, unspecified organism: Secondary | ICD-10-CM | POA: Diagnosis not present

## 2022-02-10 DIAGNOSIS — N189 Chronic kidney disease, unspecified: Secondary | ICD-10-CM | POA: Diagnosis not present

## 2022-02-10 DIAGNOSIS — Z9981 Dependence on supplemental oxygen: Secondary | ICD-10-CM | POA: Diagnosis not present

## 2022-02-10 DIAGNOSIS — Z9181 History of falling: Secondary | ICD-10-CM | POA: Diagnosis not present

## 2022-02-10 DIAGNOSIS — Z8551 Personal history of malignant neoplasm of bladder: Secondary | ICD-10-CM | POA: Diagnosis not present

## 2022-02-10 DIAGNOSIS — J9621 Acute and chronic respiratory failure with hypoxia: Secondary | ICD-10-CM | POA: Diagnosis not present

## 2022-02-10 DIAGNOSIS — Z7983 Long term (current) use of bisphosphonates: Secondary | ICD-10-CM | POA: Diagnosis not present

## 2022-02-10 DIAGNOSIS — I700 Atherosclerosis of aorta: Secondary | ICD-10-CM | POA: Diagnosis not present

## 2022-02-10 DIAGNOSIS — M810 Age-related osteoporosis without current pathological fracture: Secondary | ICD-10-CM | POA: Diagnosis not present

## 2022-02-10 DIAGNOSIS — Z7952 Long term (current) use of systemic steroids: Secondary | ICD-10-CM | POA: Diagnosis not present

## 2022-02-10 DIAGNOSIS — J440 Chronic obstructive pulmonary disease with acute lower respiratory infection: Secondary | ICD-10-CM | POA: Diagnosis not present

## 2022-02-10 DIAGNOSIS — J441 Chronic obstructive pulmonary disease with (acute) exacerbation: Secondary | ICD-10-CM | POA: Diagnosis not present

## 2022-02-10 DIAGNOSIS — Z87891 Personal history of nicotine dependence: Secondary | ICD-10-CM | POA: Diagnosis not present

## 2022-02-10 DIAGNOSIS — Z8616 Personal history of COVID-19: Secondary | ICD-10-CM | POA: Diagnosis not present

## 2022-02-10 DIAGNOSIS — I959 Hypotension, unspecified: Secondary | ICD-10-CM | POA: Diagnosis not present

## 2022-02-11 DIAGNOSIS — J189 Pneumonia, unspecified organism: Secondary | ICD-10-CM | POA: Diagnosis not present

## 2022-02-11 DIAGNOSIS — N189 Chronic kidney disease, unspecified: Secondary | ICD-10-CM | POA: Diagnosis not present

## 2022-02-11 DIAGNOSIS — Z9981 Dependence on supplemental oxygen: Secondary | ICD-10-CM | POA: Diagnosis not present

## 2022-02-11 DIAGNOSIS — Z8551 Personal history of malignant neoplasm of bladder: Secondary | ICD-10-CM | POA: Diagnosis not present

## 2022-02-11 DIAGNOSIS — J449 Chronic obstructive pulmonary disease, unspecified: Secondary | ICD-10-CM | POA: Diagnosis not present

## 2022-02-11 DIAGNOSIS — I700 Atherosclerosis of aorta: Secondary | ICD-10-CM | POA: Diagnosis not present

## 2022-02-11 DIAGNOSIS — J441 Chronic obstructive pulmonary disease with (acute) exacerbation: Secondary | ICD-10-CM | POA: Diagnosis not present

## 2022-02-11 DIAGNOSIS — J9621 Acute and chronic respiratory failure with hypoxia: Secondary | ICD-10-CM | POA: Diagnosis not present

## 2022-02-11 DIAGNOSIS — I959 Hypotension, unspecified: Secondary | ICD-10-CM | POA: Diagnosis not present

## 2022-02-11 DIAGNOSIS — Z87891 Personal history of nicotine dependence: Secondary | ICD-10-CM | POA: Diagnosis not present

## 2022-02-11 DIAGNOSIS — J440 Chronic obstructive pulmonary disease with acute lower respiratory infection: Secondary | ICD-10-CM | POA: Diagnosis not present

## 2022-02-11 DIAGNOSIS — Z7952 Long term (current) use of systemic steroids: Secondary | ICD-10-CM | POA: Diagnosis not present

## 2022-02-11 DIAGNOSIS — J439 Emphysema, unspecified: Secondary | ICD-10-CM | POA: Diagnosis not present

## 2022-02-11 DIAGNOSIS — Z8616 Personal history of COVID-19: Secondary | ICD-10-CM | POA: Diagnosis not present

## 2022-02-11 DIAGNOSIS — M810 Age-related osteoporosis without current pathological fracture: Secondary | ICD-10-CM | POA: Diagnosis not present

## 2022-02-11 DIAGNOSIS — Z9181 History of falling: Secondary | ICD-10-CM | POA: Diagnosis not present

## 2022-02-11 DIAGNOSIS — Z7983 Long term (current) use of bisphosphonates: Secondary | ICD-10-CM | POA: Diagnosis not present

## 2022-02-14 DIAGNOSIS — Z9981 Dependence on supplemental oxygen: Secondary | ICD-10-CM | POA: Diagnosis not present

## 2022-02-14 DIAGNOSIS — Z9181 History of falling: Secondary | ICD-10-CM | POA: Diagnosis not present

## 2022-02-14 DIAGNOSIS — Z7983 Long term (current) use of bisphosphonates: Secondary | ICD-10-CM | POA: Diagnosis not present

## 2022-02-14 DIAGNOSIS — Z8551 Personal history of malignant neoplasm of bladder: Secondary | ICD-10-CM | POA: Diagnosis not present

## 2022-02-14 DIAGNOSIS — Z7952 Long term (current) use of systemic steroids: Secondary | ICD-10-CM | POA: Diagnosis not present

## 2022-02-14 DIAGNOSIS — Z87891 Personal history of nicotine dependence: Secondary | ICD-10-CM | POA: Diagnosis not present

## 2022-02-14 DIAGNOSIS — Z8616 Personal history of COVID-19: Secondary | ICD-10-CM | POA: Diagnosis not present

## 2022-02-14 DIAGNOSIS — J189 Pneumonia, unspecified organism: Secondary | ICD-10-CM | POA: Diagnosis not present

## 2022-02-14 DIAGNOSIS — J440 Chronic obstructive pulmonary disease with acute lower respiratory infection: Secondary | ICD-10-CM | POA: Diagnosis not present

## 2022-02-14 DIAGNOSIS — I700 Atherosclerosis of aorta: Secondary | ICD-10-CM | POA: Diagnosis not present

## 2022-02-14 DIAGNOSIS — J9621 Acute and chronic respiratory failure with hypoxia: Secondary | ICD-10-CM | POA: Diagnosis not present

## 2022-02-14 DIAGNOSIS — N189 Chronic kidney disease, unspecified: Secondary | ICD-10-CM | POA: Diagnosis not present

## 2022-02-14 DIAGNOSIS — I959 Hypotension, unspecified: Secondary | ICD-10-CM | POA: Diagnosis not present

## 2022-02-14 DIAGNOSIS — J441 Chronic obstructive pulmonary disease with (acute) exacerbation: Secondary | ICD-10-CM | POA: Diagnosis not present

## 2022-02-14 DIAGNOSIS — M810 Age-related osteoporosis without current pathological fracture: Secondary | ICD-10-CM | POA: Diagnosis not present

## 2022-02-19 DIAGNOSIS — J189 Pneumonia, unspecified organism: Secondary | ICD-10-CM | POA: Diagnosis not present

## 2022-02-19 DIAGNOSIS — C4490 Unspecified malignant neoplasm of skin, unspecified: Secondary | ICD-10-CM | POA: Diagnosis not present

## 2022-02-19 DIAGNOSIS — Z8616 Personal history of COVID-19: Secondary | ICD-10-CM | POA: Diagnosis not present

## 2022-02-19 DIAGNOSIS — K219 Gastro-esophageal reflux disease without esophagitis: Secondary | ICD-10-CM | POA: Diagnosis not present

## 2022-02-19 DIAGNOSIS — N189 Chronic kidney disease, unspecified: Secondary | ICD-10-CM | POA: Diagnosis not present

## 2022-02-19 DIAGNOSIS — Z8551 Personal history of malignant neoplasm of bladder: Secondary | ICD-10-CM | POA: Diagnosis not present

## 2022-02-19 DIAGNOSIS — J329 Chronic sinusitis, unspecified: Secondary | ICD-10-CM | POA: Diagnosis not present

## 2022-02-19 DIAGNOSIS — I959 Hypotension, unspecified: Secondary | ICD-10-CM | POA: Diagnosis not present

## 2022-02-19 DIAGNOSIS — J440 Chronic obstructive pulmonary disease with acute lower respiratory infection: Secondary | ICD-10-CM | POA: Diagnosis not present

## 2022-02-19 DIAGNOSIS — J9621 Acute and chronic respiratory failure with hypoxia: Secondary | ICD-10-CM | POA: Diagnosis not present

## 2022-02-19 DIAGNOSIS — Z9181 History of falling: Secondary | ICD-10-CM | POA: Diagnosis not present

## 2022-02-19 DIAGNOSIS — M810 Age-related osteoporosis without current pathological fracture: Secondary | ICD-10-CM | POA: Diagnosis not present

## 2022-02-19 DIAGNOSIS — I700 Atherosclerosis of aorta: Secondary | ICD-10-CM | POA: Diagnosis not present

## 2022-02-19 DIAGNOSIS — C679 Malignant neoplasm of bladder, unspecified: Secondary | ICD-10-CM | POA: Diagnosis not present

## 2022-02-19 DIAGNOSIS — Z87891 Personal history of nicotine dependence: Secondary | ICD-10-CM | POA: Diagnosis not present

## 2022-02-19 DIAGNOSIS — Z9981 Dependence on supplemental oxygen: Secondary | ICD-10-CM | POA: Diagnosis not present

## 2022-02-19 DIAGNOSIS — Z7983 Long term (current) use of bisphosphonates: Secondary | ICD-10-CM | POA: Diagnosis not present

## 2022-02-19 DIAGNOSIS — J441 Chronic obstructive pulmonary disease with (acute) exacerbation: Secondary | ICD-10-CM | POA: Diagnosis not present

## 2022-02-19 DIAGNOSIS — J439 Emphysema, unspecified: Secondary | ICD-10-CM | POA: Diagnosis not present

## 2022-02-19 DIAGNOSIS — Z7952 Long term (current) use of systemic steroids: Secondary | ICD-10-CM | POA: Diagnosis not present

## 2022-02-19 DIAGNOSIS — R69 Illness, unspecified: Secondary | ICD-10-CM | POA: Diagnosis not present

## 2022-02-19 DIAGNOSIS — M549 Dorsalgia, unspecified: Secondary | ICD-10-CM | POA: Diagnosis not present

## 2022-02-20 DIAGNOSIS — R112 Nausea with vomiting, unspecified: Secondary | ICD-10-CM | POA: Diagnosis not present

## 2022-03-04 DIAGNOSIS — J440 Chronic obstructive pulmonary disease with acute lower respiratory infection: Secondary | ICD-10-CM | POA: Diagnosis not present

## 2022-03-04 DIAGNOSIS — M810 Age-related osteoporosis without current pathological fracture: Secondary | ICD-10-CM | POA: Diagnosis not present

## 2022-03-04 DIAGNOSIS — N189 Chronic kidney disease, unspecified: Secondary | ICD-10-CM | POA: Diagnosis not present

## 2022-03-04 DIAGNOSIS — J441 Chronic obstructive pulmonary disease with (acute) exacerbation: Secondary | ICD-10-CM | POA: Diagnosis not present

## 2022-03-04 DIAGNOSIS — Z8616 Personal history of COVID-19: Secondary | ICD-10-CM | POA: Diagnosis not present

## 2022-03-04 DIAGNOSIS — Z8551 Personal history of malignant neoplasm of bladder: Secondary | ICD-10-CM | POA: Diagnosis not present

## 2022-03-04 DIAGNOSIS — Z7983 Long term (current) use of bisphosphonates: Secondary | ICD-10-CM | POA: Diagnosis not present

## 2022-03-04 DIAGNOSIS — J189 Pneumonia, unspecified organism: Secondary | ICD-10-CM | POA: Diagnosis not present

## 2022-03-04 DIAGNOSIS — Z7952 Long term (current) use of systemic steroids: Secondary | ICD-10-CM | POA: Diagnosis not present

## 2022-03-04 DIAGNOSIS — I959 Hypotension, unspecified: Secondary | ICD-10-CM | POA: Diagnosis not present

## 2022-03-04 DIAGNOSIS — J9621 Acute and chronic respiratory failure with hypoxia: Secondary | ICD-10-CM | POA: Diagnosis not present

## 2022-03-04 DIAGNOSIS — Z9181 History of falling: Secondary | ICD-10-CM | POA: Diagnosis not present

## 2022-03-04 DIAGNOSIS — Z87891 Personal history of nicotine dependence: Secondary | ICD-10-CM | POA: Diagnosis not present

## 2022-03-04 DIAGNOSIS — I700 Atherosclerosis of aorta: Secondary | ICD-10-CM | POA: Diagnosis not present

## 2022-03-04 DIAGNOSIS — Z9981 Dependence on supplemental oxygen: Secondary | ICD-10-CM | POA: Diagnosis not present

## 2022-03-14 DIAGNOSIS — J439 Emphysema, unspecified: Secondary | ICD-10-CM | POA: Diagnosis not present

## 2022-03-14 DIAGNOSIS — J449 Chronic obstructive pulmonary disease, unspecified: Secondary | ICD-10-CM | POA: Diagnosis not present

## 2022-03-26 DIAGNOSIS — L309 Dermatitis, unspecified: Secondary | ICD-10-CM | POA: Diagnosis not present

## 2022-03-26 DIAGNOSIS — J449 Chronic obstructive pulmonary disease, unspecified: Secondary | ICD-10-CM | POA: Diagnosis not present

## 2022-03-26 DIAGNOSIS — Z881 Allergy status to other antibiotic agents status: Secondary | ICD-10-CM | POA: Diagnosis not present

## 2022-03-26 DIAGNOSIS — Z885 Allergy status to narcotic agent status: Secondary | ICD-10-CM | POA: Diagnosis not present

## 2022-03-26 DIAGNOSIS — R609 Edema, unspecified: Secondary | ICD-10-CM | POA: Diagnosis not present

## 2022-03-26 DIAGNOSIS — Z87891 Personal history of nicotine dependence: Secondary | ICD-10-CM | POA: Diagnosis not present

## 2022-03-26 DIAGNOSIS — Z886 Allergy status to analgesic agent status: Secondary | ICD-10-CM | POA: Diagnosis not present

## 2022-03-26 DIAGNOSIS — Z882 Allergy status to sulfonamides status: Secondary | ICD-10-CM | POA: Diagnosis not present

## 2022-03-26 DIAGNOSIS — Z880 Allergy status to penicillin: Secondary | ICD-10-CM | POA: Diagnosis not present

## 2022-04-13 DIAGNOSIS — J449 Chronic obstructive pulmonary disease, unspecified: Secondary | ICD-10-CM | POA: Diagnosis not present

## 2022-04-13 DIAGNOSIS — J439 Emphysema, unspecified: Secondary | ICD-10-CM | POA: Diagnosis not present

## 2022-05-14 DIAGNOSIS — J439 Emphysema, unspecified: Secondary | ICD-10-CM | POA: Diagnosis not present

## 2022-05-14 DIAGNOSIS — J449 Chronic obstructive pulmonary disease, unspecified: Secondary | ICD-10-CM | POA: Diagnosis not present

## 2022-05-23 DIAGNOSIS — K219 Gastro-esophageal reflux disease without esophagitis: Secondary | ICD-10-CM | POA: Diagnosis not present

## 2022-05-23 DIAGNOSIS — J439 Emphysema, unspecified: Secondary | ICD-10-CM | POA: Diagnosis not present

## 2022-06-14 DIAGNOSIS — J439 Emphysema, unspecified: Secondary | ICD-10-CM | POA: Diagnosis not present

## 2022-06-14 DIAGNOSIS — J449 Chronic obstructive pulmonary disease, unspecified: Secondary | ICD-10-CM | POA: Diagnosis not present

## 2022-07-14 DIAGNOSIS — J439 Emphysema, unspecified: Secondary | ICD-10-CM | POA: Diagnosis not present

## 2022-07-14 DIAGNOSIS — J449 Chronic obstructive pulmonary disease, unspecified: Secondary | ICD-10-CM | POA: Diagnosis not present

## 2022-08-04 DIAGNOSIS — Z936 Other artificial openings of urinary tract status: Secondary | ICD-10-CM | POA: Diagnosis not present

## 2022-08-05 DIAGNOSIS — R051 Acute cough: Secondary | ICD-10-CM | POA: Diagnosis not present

## 2022-08-05 DIAGNOSIS — Z79899 Other long term (current) drug therapy: Secondary | ICD-10-CM | POA: Diagnosis not present

## 2022-08-05 DIAGNOSIS — Z1152 Encounter for screening for COVID-19: Secondary | ICD-10-CM | POA: Diagnosis not present

## 2022-08-05 DIAGNOSIS — Z880 Allergy status to penicillin: Secondary | ICD-10-CM | POA: Diagnosis not present

## 2022-08-05 DIAGNOSIS — Z886 Allergy status to analgesic agent status: Secondary | ICD-10-CM | POA: Diagnosis not present

## 2022-08-05 DIAGNOSIS — R059 Cough, unspecified: Secondary | ICD-10-CM | POA: Diagnosis not present

## 2022-08-05 DIAGNOSIS — Z885 Allergy status to narcotic agent status: Secondary | ICD-10-CM | POA: Diagnosis not present

## 2022-08-05 DIAGNOSIS — Z20822 Contact with and (suspected) exposure to covid-19: Secondary | ICD-10-CM | POA: Diagnosis not present

## 2022-08-05 DIAGNOSIS — R112 Nausea with vomiting, unspecified: Secondary | ICD-10-CM | POA: Diagnosis not present

## 2022-08-05 DIAGNOSIS — Z87891 Personal history of nicotine dependence: Secondary | ICD-10-CM | POA: Diagnosis not present

## 2022-08-05 DIAGNOSIS — Z882 Allergy status to sulfonamides status: Secondary | ICD-10-CM | POA: Diagnosis not present

## 2022-08-05 DIAGNOSIS — R0602 Shortness of breath: Secondary | ICD-10-CM | POA: Diagnosis not present

## 2022-08-05 DIAGNOSIS — R509 Fever, unspecified: Secondary | ICD-10-CM | POA: Diagnosis not present

## 2022-08-05 DIAGNOSIS — J189 Pneumonia, unspecified organism: Secondary | ICD-10-CM | POA: Diagnosis not present

## 2022-08-14 DIAGNOSIS — J449 Chronic obstructive pulmonary disease, unspecified: Secondary | ICD-10-CM | POA: Diagnosis not present

## 2022-08-14 DIAGNOSIS — J439 Emphysema, unspecified: Secondary | ICD-10-CM | POA: Diagnosis not present

## 2022-08-26 DIAGNOSIS — K219 Gastro-esophageal reflux disease without esophagitis: Secondary | ICD-10-CM | POA: Diagnosis not present

## 2022-08-26 DIAGNOSIS — C679 Malignant neoplasm of bladder, unspecified: Secondary | ICD-10-CM | POA: Diagnosis not present

## 2022-08-26 DIAGNOSIS — J439 Emphysema, unspecified: Secondary | ICD-10-CM | POA: Diagnosis not present

## 2022-08-26 DIAGNOSIS — R69 Illness, unspecified: Secondary | ICD-10-CM | POA: Diagnosis not present

## 2022-08-26 DIAGNOSIS — C4490 Unspecified malignant neoplasm of skin, unspecified: Secondary | ICD-10-CM | POA: Diagnosis not present

## 2022-09-13 DIAGNOSIS — J449 Chronic obstructive pulmonary disease, unspecified: Secondary | ICD-10-CM | POA: Diagnosis not present

## 2022-09-13 DIAGNOSIS — J439 Emphysema, unspecified: Secondary | ICD-10-CM | POA: Diagnosis not present

## 2022-09-29 ENCOUNTER — Emergency Department (HOSPITAL_COMMUNITY)
Admission: EM | Admit: 2022-09-29 | Discharge: 2022-09-29 | Disposition: A | Discharge status: Home / Self Care | Payer: Medicare PPO | Attending: Emergency Medicine | Admitting: Emergency Medicine

## 2022-09-29 ENCOUNTER — Encounter (HOSPITAL_COMMUNITY): Payer: Self-pay | Admitting: Emergency Medicine

## 2022-09-29 ENCOUNTER — Emergency Department (HOSPITAL_COMMUNITY): Payer: Medicare PPO

## 2022-09-29 ENCOUNTER — Other Ambulatory Visit: Payer: Self-pay

## 2022-09-29 DIAGNOSIS — Z87891 Personal history of nicotine dependence: Secondary | ICD-10-CM | POA: Insufficient documentation

## 2022-09-29 DIAGNOSIS — J441 Chronic obstructive pulmonary disease with (acute) exacerbation: Secondary | ICD-10-CM | POA: Insufficient documentation

## 2022-09-29 DIAGNOSIS — Z8616 Personal history of COVID-19: Secondary | ICD-10-CM | POA: Insufficient documentation

## 2022-09-29 DIAGNOSIS — R059 Cough, unspecified: Secondary | ICD-10-CM

## 2022-09-29 LAB — CBC WITH DIFFERENTIAL/PLATELET
Abs Immature Granulocytes: 0.02 10*3/uL (ref 0.00–0.07)
Basophils Absolute: 0.1 10*3/uL (ref 0.0–0.1)
Basophils Relative: 1 %
Eosinophils Absolute: 0.1 10*3/uL (ref 0.0–0.5)
Eosinophils Relative: 3 %
HCT: 34.2 % — ABNORMAL LOW (ref 36.0–46.0)
Hemoglobin: 10.9 g/dL — ABNORMAL LOW (ref 12.0–15.0)
Immature Granulocytes: 0 %
Lymphocytes Relative: 20 %
Lymphs Abs: 1 10*3/uL (ref 0.7–4.0)
MCH: 30.4 pg (ref 26.0–34.0)
MCHC: 31.9 g/dL (ref 30.0–36.0)
MCV: 95.3 fL (ref 80.0–100.0)
Monocytes Absolute: 0.4 10*3/uL (ref 0.1–1.0)
Monocytes Relative: 8 %
Neutro Abs: 3.5 10*3/uL (ref 1.7–7.7)
Neutrophils Relative %: 68 %
Platelets: 262 10*3/uL (ref 150–400)
RBC: 3.59 MIL/uL — ABNORMAL LOW (ref 3.87–5.11)
RDW: 14 % (ref 11.5–15.5)
WBC: 5.2 10*3/uL (ref 4.0–10.5)
nRBC: 0 % (ref 0.0–0.2)

## 2022-09-29 LAB — BASIC METABOLIC PANEL
Anion gap: 7 (ref 5–15)
BUN: 14 mg/dL (ref 8–23)
CO2: 26 mmol/L (ref 22–32)
Calcium: 8.8 mg/dL — ABNORMAL LOW (ref 8.9–10.3)
Chloride: 104 mmol/L (ref 98–111)
Creatinine, Ser: 0.96 mg/dL (ref 0.44–1.00)
GFR, Estimated: 59 mL/min — ABNORMAL LOW (ref >60)
Glucose, Bld: 96 mg/dL (ref 70–99)
Potassium: 3.6 mmol/L (ref 3.5–5.1)
Sodium: 137 mmol/L (ref 135–145)

## 2022-09-29 LAB — BLOOD GAS, VENOUS
Acid-Base Excess: 3.6 mmol/L — ABNORMAL HIGH (ref 0.0–2.0)
Bicarbonate: 31.1 mmol/L — ABNORMAL HIGH (ref 20.0–28.0)
Drawn by: 66296
O2 Saturation: 24.6 %
Patient temperature: 36.4
pCO2, Ven: 58 mmHg (ref 44–60)
pH, Ven: 7.34 (ref 7.25–7.43)
pO2, Ven: <31 mmHg — CL (ref 32–45)

## 2022-09-29 MED ORDER — DOXYCYCLINE HYCLATE 100 MG PO CAPS: 100.0000 mg | ORAL_CAPSULE | Freq: Two times a day (BID) | ORAL | 0 refills | Status: AC

## 2022-09-29 MED ORDER — IPRATROPIUM-ALBUTEROL 0.5-2.5 (3) MG/3ML IN SOLN
3.0000 mL | Freq: Once | RESPIRATORY_TRACT | Status: AC
  Administered 2022-09-29: 3 mL via RESPIRATORY_TRACT
  Filled 2022-09-29: qty 3

## 2022-09-29 MED ORDER — PREDNISONE 50 MG PO TABS: 50.0000 mg | ORAL_TABLET | Freq: Every day | ORAL | 0 refills | Status: AC

## 2022-09-29 MED ORDER — DOXYCYCLINE HYCLATE 100 MG PO TABS
100.0000 mg | ORAL_TABLET | Freq: Once | ORAL | Status: AC
  Administered 2022-09-29: 100 mg via ORAL
  Filled 2022-09-29: qty 1

## 2022-09-29 MED ORDER — PREDNISONE 50 MG PO TABS
60.0000 mg | ORAL_TABLET | Freq: Once | ORAL | Status: AC
  Administered 2022-09-29: 60 mg via ORAL
  Filled 2022-09-29: qty 1

## 2022-09-29 NOTE — ED Provider Notes (Signed)
[facility] Provider Note  CSN: [id number] Arrival date & time: 09/29/22 1006  Chief Complaint(s) Cough  HPI Amanda [name] is a 82 y.o. female with history of COPD, on home 2 L, recurrent right-sided pneumothorax presenting with shortness of breath.  She reports shortness of breath for the past week.  Reports that it has been worsening.  She reports that she has also had a cough productive of yellow sputum.  No nausea, vomiting, chest pain.  She reports increased wheezing at home.  No fevers or chills.  No abdominal pain, diarrhea.  Symptoms mild.   Past Medical History Past Medical History:  Diagnosis Date   Anemia    Anxiety    Arthritis    Bladder cancer ([place])    bladder cancer   Blood transfusion without reported diagnosis    COPD (chronic obstructive pulmonary disease) ([place])    COVID-19 07/2019   Dyspnea    Former smoker    History of kidney stones    Pneumonia    Renal disorder    bladder cancer   Requires continuous at home supplemental oxygen    via    Seizures ([place])    over 25 yrs ago, no problems since per patient as of 02/01/20   Spontaneous pneumothorax - recurrent 05/2018   right   Wears dentures    Wears glasses    Patient Active Problem List   Diagnosis Date Noted   S/P Bronchoscopy with placement of IBV  12/02/2019   Pneumothorax on right 12/01/2019   Hypokalemia 09/30/2019   Recurrent spontaneous pneumothorax 09/29/2019   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 Authorizing Provider  doxycycline (VIBRAMYCIN) 100 MG capsule Take 1 capsule (100 mg total) by mouth 2 (two) times daily for 7 days. 09/29/22 10/06/22 Yes Cristie Hem, MD  predniSONE (DELTASONE) 50 MG tablet Take 1 tablet (50  mg total) by mouth daily for 4 days. 09/30/22 10/04/22 Yes Cristie Hem, MD  acetaminophen (TYLENOL) 500 MG tablet Take 500-1,000 mg by mouth daily as needed for mild pain or moderate pain.     [provider]  albuterol (PROVENTIL HFA;VENTOLIN HFA) 108 (90 Base) MCG/ACT inhaler Inhale 1-2 puffs into the lungs every 6 (six) hours as needed for wheezing or shortness of breath. 09/29/18   Hayden Rasmussen, MD  ALPRAZolam Duanne Moron) 0.5 MG tablet Take 0.25 mg by mouth daily as needed for anxiety. 12/24/19   [provider]  ascorbic acid (GNP VITAMIN C) 500 MG tablet Take 500 mg by mouth daily.    [provider]  Ascorbic Acid (VITAMIN C) 500 MG CAPS Take 500 mg by mouth daily.     [provider]  Cholecalciferol (VITAMIN D-3) 125 MCG (5000 UT) TABS Take 5,000 Units by mouth daily.    [provider]  docusate sodium (COLACE) 100 MG capsule Take 1 capsule (100 mg total) by mouth 2 (two) times daily. Patient taking differently: Take 100 mg by mouth daily.  10/19/19   Geradine Girt, DO  guaiFENesin (MUCINEX) 600 MG 12 hr tablet Take 600 mg by mouth daily.    [provider]  ipratropium-albuterol (DUONEB) 0.5-2.5 (3) MG/3ML SOLN Take 3 mLs by nebulization every 6 (six) hours as needed for wheezing or shortness  of breath. 01/03/20   [provider]  Multiple Vitamin (MULTIVITAMIN WITH MINERALS) TABS tablet Take 1 tablet by mouth daily. Centrum Multivitamin A to Zinc    [provider]  SYMBICORT 80-4.5 MCG/ACT inhaler Inhale 2 puffs into the lungs 2 (two) times daily. 09/05/19   [provider]                                                                                                                                    Past Surgical History Past Surgical History:  Procedure Laterality Date   ABDOMINAL HYSTERECTOMY     CHEST TUBE INSERTION Right 02/02/2020   Procedure: CHEST TUBE REMOVAL;  Surgeon: [name], MD;   Location: [facility];  Service: Thoracic;  Laterality: Right;   CYSTECTOMY W/ URETEROILEAL CONDUIT  2009   DILATION AND CURETTAGE OF UTERUS     VIDEO BRONCHOSCOPY WITH INSERTION OF INTERBRONCHIAL VALVE (IBV) N/A 10/12/2019   Procedure: VIDEO BRONCHOSCOPY WITH INSERTION OF INTERBRONCHIAL VALVES (IBV)  TIMES THREE RIGHT UPPER LOBE , ONE MIDDLE LOBE.;  Surgeon: [name], MD;  Location: [facility] OR;  Service: Thoracic;  Laterality: N/A;   VIDEO BRONCHOSCOPY WITH INSERTION OF INTERBRONCHIAL VALVE (IBV) N/A 12/01/2019   Procedure: VIDEO BRONCHOSCOPY WITH INSERTION OF INTERBRONCHIAL VALVE (IBV);  Surgeon: [name], MD;  Location: [facility];  Service: Thoracic;  Laterality: N/A;   VIDEO BRONCHOSCOPY WITH INSERTION OF INTERBRONCHIAL VALVE (IBV) N/A 02/02/2020   Procedure: VIDEO BRONCHOSCOPY WITH REMOVAL OF INTERBRONCHIAL VALVE (IBV) times eight;  Surgeon: [name], MD;  Location: [facility] OR;  Service: Thoracic;  Laterality: N/A;   Family History History reviewed. No pertinent family history.  Social History Social History   Tobacco Use   Smoking status: Former    Types: Cigarettes    Quit date: 2018    Years since quitting: 6.0   Smokeless tobacco: Never  Vaping Use   Vaping Use: Never used  Substance Use Topics   Alcohol use: Never   Drug use: Never   Allergies Sulfa antibiotics, Ciprofloxacin, Lactose, Levofloxacin, Sulfamethoxazole, Aspirin, Cephalexin, Clindamycin, Erythromycin base, Hydromorphone, and Penicillins  Review of Systems Review of Systems  All other systems reviewed and are negative.   Physical Exam Vital Signs  I have reviewed the triage vital signs BP 122/65   Pulse 92   Temp 98.3 F (36.8 C) (Oral)   Resp 18   SpO2 96%  Physical Exam Vitals and nursing note reviewed.  Constitutional:      General: She is not in acute distress.    Appearance: She is well-developed.  HENT:     Head: Normocephalic and atraumatic.     Mouth/Throat:     Mouth:  Mucous membranes are moist.  Eyes:     Pupils: Pupils are equal, round, and reactive to light.  Cardiovascular:     Rate and Rhythm: Normal rate and regular rhythm.  Heart sounds: No murmur heard. Pulmonary:     Comments: No respiratory distress, normal work of breathing but diffuse wheezes Abdominal:     General: Abdomen is flat.     Palpations: Abdomen is soft.     Tenderness: There is no abdominal tenderness.  Musculoskeletal:        General: No tenderness.     Right lower leg: No edema.     Left lower leg: No edema.  Skin:    General: Skin is warm and dry.  Neurological:     General: No focal deficit present.     Mental Status: She is alert. Mental status is at baseline.  Psychiatric:        Mood and Affect: Mood normal.        Behavior: Behavior normal.     ED Results and Treatments Labs (all labs ordered are listed, but only abnormal results are displayed) Labs Reviewed  BASIC METABOLIC PANEL - Abnormal; Notable for the following components:      Result Value   Calcium 8.8 (*)    GFR, Estimated 59 (*)    All other components within normal limits  CBC WITH DIFFERENTIAL/PLATELET - Abnormal; Notable for the following components:   RBC 3.59 (*)    Hemoglobin 10.9 (*)    HCT 34.2 (*)    All other components within normal limits  BLOOD GAS, VENOUS - Abnormal; Notable for the following components:   pO2, Ven <31 (*)    Bicarbonate 31.1 (*)    Acid-Base Excess 3.6 (*)    All other components within normal limits                                                                                                                          Radiology DG Chest 2 View  Result Date: 09/29/2022 CLINICAL DATA:  Cough EXAM: CHEST - 2 VIEW COMPARISON:  03/31/2021 and 08/05/2022. FINDINGS: Lungs are hyperinflated consistent with COPD. Apical scarring. Pulmonary vascular congestion. Calcified aorta. Unremarkable cardiac silhouette. No pneumothorax identified. Osteopenia noted with  midthoracic compression deformities that are stable since the most recent chest x-ray and with interval worsening compared to July 2022. IMPRESSION: COPD and scarring. Osteopenia and thoracic compression deformities again noted. Electronically Signed   By: Sammie Bench M.D.   On: 09/29/2022 11:35    Pertinent labs & imaging results that were available during my care of the patient were reviewed by me and considered in my medical decision making (see MDM for details).  Medications Ordered in ED Medications  predniSONE (DELTASONE) tablet 60 mg (60 mg Oral Given 09/29/22 1244)  ipratropium-albuterol (DUONEB) 0.5-2.5 (3) MG/3ML nebulizer solution 3 mL (3 mLs Nebulization Given 09/29/22 1305)  doxycycline (VIBRA-TABS) tablet 100 mg (100 mg Oral Given 09/29/22 1354)  Procedures Procedures  (including critical care time)  Medical Decision Making / ED Course   MDM:  82 year old female with history of COPD presenting with shortness of breath.  Suspect mild COPD exacerbation.  Patient not hypoxic on home oxygen.  She is having increased sputum production and diffusely wheezy.  Will give prednisone and nebulization treatment.  Chest x-ray does not show pneumonia, pneumothorax.  Labs are reassuring.  Will check VBG to evaluate for CO2 retention but patient mentating normally so lower concern.  Likely discharge with treatment for prednisone, antibiotics.  Clinical Course as of 09/29/22 1545  Mon Sep 29, 2022  1543 Laboratory testing reassuring.  Patient feels better after DuoNeb and wheezing is improved.  Will discharge with antibiotics for pneumonia, patient has significant allergy history so we will give doxycycline.  Will also give prednisone.  Advise close follow-up with her pulmonologist and primary physician. Will discharge patient to home. All questions answered.  Patient comfortable with plan of discharge. Return precautions discussed with patient and specified on the after visit summary.  [WS]    Clinical Course User Index [WS] Cristie Hem, MD     Additional history obtained: -Additional history obtained from family  Lab Tests: -I ordered, reviewed, and interpreted labs.   The pertinent results include:   Labs Reviewed  BASIC METABOLIC PANEL - Abnormal; Notable for the following components:      Result Value   Calcium 8.8 (*)    GFR, Estimated 59 (*)    All other components within normal limits  CBC WITH DIFFERENTIAL/PLATELET - Abnormal; Notable for the following components:   RBC 3.59 (*)    Hemoglobin 10.9 (*)    HCT 34.2 (*)    All other components within normal limits  BLOOD GAS, VENOUS - Abnormal; Notable for the following components:   pO2, Ven <31 (*)    Bicarbonate 31.1 (*)    Acid-Base Excess 3.6 (*)    All other components within normal limits    Notable for no CO2 retention   Imaging Studies ordered: I ordered imaging studies including CXR On my interpretation imaging demonstrates no acute process I independently visualized and interpreted imaging. I agree with the radiologist interpretation   Medicines ordered and prescription drug management: Meds ordered this encounter  Medications   predniSONE (DELTASONE) tablet 60 mg   ipratropium-albuterol (DUONEB) 0.5-2.5 (3) MG/3ML nebulizer solution 3 mL   doxycycline (VIBRA-TABS) tablet 100 mg   predniSONE (DELTASONE) 50 MG tablet    Sig: Take 1 tablet (50 mg total) by mouth daily for 4 days.    Dispense:  4 tablet    Refill:  0   doxycycline (VIBRAMYCIN) 100 MG capsule    Sig: Take 1 capsule (100 mg total) by mouth 2 (two) times daily for 7 days.    Dispense:  14 capsule    Refill:  0    -I have reviewed the patients home medicines and have made adjustments as needed   Cardiac Monitoring: The patient was maintained on a cardiac monitor.  I personally  viewed and interpreted the cardiac monitored which showed an underlying rhythm of: NSR  Social Determinants of Health:  Diagnosis or treatment significantly limited by social determinants of health: former smoker   Reevaluation: After the interventions noted above, I reevaluated the patient and found that they have improved  Co morbidities that complicate the patient evaluation  Past Medical History:  Diagnosis Date   Anemia    Anxiety    Arthritis  Bladder cancer ([place])    bladder cancer   Blood transfusion without reported diagnosis    COPD (chronic obstructive pulmonary disease) ([place])    COVID-19 07/2019   Dyspnea    Former smoker    History of kidney stones    Pneumonia    Renal disorder    bladder cancer   Requires continuous at home supplemental oxygen    via [place]   Seizures ([place])    over 25 yrs ago, no problems since per patient as of 02/01/20   Spontaneous pneumothorax - recurrent 05/2018   right   Wears dentures    Wears glasses       Dispostion: Disposition decision including need for hospitalization was considered, and patient discharged from emergency department.    Final Clinical Impression(s) / ED Diagnoses Final diagnoses:  COPD exacerbation ([place])     This chart was dictated using voice recognition software.  Despite best efforts to proofread,  errors can occur which can change the documentation meaning.    Cristie Hem, MD 09/29/22 [phone]

## 2022-09-29 NOTE — Discharge Instructions (Addendum)
We evaluated you for your respiratory symptoms.  Your chest x-ray did not show a pneumonia.  Your symptoms improved with breathing treatment.  We have prescribed you an antibiotic to take for your COPD exacerbation as well as a steroid.  Please continue to take your home breathing treatments.  Please return to the emergency department if any of your symptoms worsen or you develop fevers, increased shortness of breath, chest pain, fainting, or any other concerning symptoms.

## 2022-09-29 NOTE — ED Triage Notes (Signed)
Pt c/o prod  cough with yellow in color, over a month ago. Just had PNA. On chronic 3L [place] . A/o. No gen weakness noted. Denies fevers.

## 2022-10-03 ENCOUNTER — Telehealth: Payer: Self-pay | Admitting: *Deleted

## 2022-10-03 NOTE — Telephone Encounter (Signed)
     Patient  visit on 09/29/2022  at [facility] was for Copd  Have you been able to follow up with your primary care physician? Patient has needs for food and utility assistance put in a [place] [facility] referral should have case management to get her a mobility appliance and slos apply her for Copd wellness through [facility]  The patient was able to obtain any needed medicine or equipment.  Are there diet recommendations that you are having difficulty following?  Patient expresses understanding of discharge instructions and education provided has no other needs at this time. yes  [facility] [phone] [address] , [place] [address] Email : [email or web address]

## 2022-10-12 ENCOUNTER — Encounter (HOSPITAL_COMMUNITY): Payer: Self-pay

## 2022-10-12 ENCOUNTER — Other Ambulatory Visit: Payer: Self-pay

## 2022-10-12 ENCOUNTER — Emergency Department (HOSPITAL_COMMUNITY)
Admission: EM | Admit: 2022-10-12 | Discharge: 2022-10-12 | Disposition: A | Discharge status: Home / Self Care | Payer: Medicare PPO | Attending: Emergency Medicine | Admitting: Emergency Medicine

## 2022-10-12 ENCOUNTER — Emergency Department (HOSPITAL_COMMUNITY): Payer: Medicare PPO

## 2022-10-12 DIAGNOSIS — R000 Tachycardia, unspecified: Secondary | ICD-10-CM | POA: Insufficient documentation

## 2022-10-12 DIAGNOSIS — Z9981 Dependence on supplemental oxygen: Secondary | ICD-10-CM | POA: Diagnosis not present

## 2022-10-12 DIAGNOSIS — J439 Emphysema, unspecified: Secondary | ICD-10-CM | POA: Diagnosis not present

## 2022-10-12 DIAGNOSIS — Z7951 Long term (current) use of inhaled steroids: Secondary | ICD-10-CM | POA: Diagnosis not present

## 2022-10-12 DIAGNOSIS — R059 Cough, unspecified: Secondary | ICD-10-CM | POA: Diagnosis present

## 2022-10-12 DIAGNOSIS — J449 Chronic obstructive pulmonary disease, unspecified: Secondary | ICD-10-CM | POA: Insufficient documentation

## 2022-10-12 LAB — CBC WITH DIFFERENTIAL/PLATELET
Abs Immature Granulocytes: 0.04 10*3/uL (ref 0.00–0.07)
Basophils Absolute: 0.1 10*3/uL (ref 0.0–0.1)
Basophils Relative: 1 %
Eosinophils Absolute: 0 10*3/uL (ref 0.0–0.5)
Eosinophils Relative: 0 %
HCT: 36.8 % (ref 36.0–46.0)
Hemoglobin: 11.8 g/dL — ABNORMAL LOW (ref 12.0–15.0)
Immature Granulocytes: 0 %
Lymphocytes Relative: 9 %
Lymphs Abs: 0.9 10*3/uL (ref 0.7–4.0)
MCH: 30.3 pg (ref 26.0–34.0)
MCHC: 32.1 g/dL (ref 30.0–36.0)
MCV: 94.6 fL (ref 80.0–100.0)
Monocytes Absolute: 0.6 10*3/uL (ref 0.1–1.0)
Monocytes Relative: 6 %
Neutro Abs: 8.7 10*3/uL — ABNORMAL HIGH (ref 1.7–7.7)
Neutrophils Relative %: 84 %
Platelets: 274 10*3/uL (ref 150–400)
RBC: 3.89 MIL/uL (ref 3.87–5.11)
RDW: 13.7 % (ref 11.5–15.5)
WBC: 10.3 10*3/uL (ref 4.0–10.5)
nRBC: 0 % (ref 0.0–0.2)

## 2022-10-12 LAB — BASIC METABOLIC PANEL
Anion gap: 9 (ref 5–15)
BUN: 17 mg/dL (ref 8–23)
CO2: 26 mmol/L (ref 22–32)
Calcium: 8.3 mg/dL — ABNORMAL LOW (ref 8.9–10.3)
Chloride: 100 mmol/L (ref 98–111)
Creatinine, Ser: 0.98 mg/dL (ref 0.44–1.00)
GFR, Estimated: 58 mL/min — ABNORMAL LOW (ref >60)
Glucose, Bld: 107 mg/dL — ABNORMAL HIGH (ref 70–99)
Potassium: 3.6 mmol/L (ref 3.5–5.1)
Sodium: 135 mmol/L (ref 135–145)

## 2022-10-12 LAB — BRAIN NATRIURETIC PEPTIDE: B Natriuretic Peptide: 68 pg/mL (ref 0.0–100.0)

## 2022-10-12 MED ORDER — CEFDINIR 300 MG PO CAPS
300.0000 mg | ORAL_CAPSULE | Freq: Two times a day (BID) | ORAL | Status: DC
  Administered 2022-10-12: 300 mg via ORAL
  Filled 2022-10-12: qty 1

## 2022-10-12 MED ORDER — PREDNISONE 20 MG PO TABS: 40.0000 mg | ORAL_TABLET | Freq: Every day | ORAL | 0 refills | Status: DC

## 2022-10-12 MED ORDER — ALBUTEROL (5 MG/ML) CONTINUOUS INHALATION SOLN: 10.0000 mg | INHALATION_SOLUTION | RESPIRATORY_TRACT | Status: DC

## 2022-10-12 MED ORDER — CEFDINIR 300 MG PO CAPS: 300.0000 mg | ORAL_CAPSULE | Freq: Two times a day (BID) | ORAL | 0 refills | Status: AC

## 2022-10-12 MED ORDER — PREDNISONE 20 MG PO TABS
40.0000 mg | ORAL_TABLET | Freq: Once | ORAL | Status: AC
  Administered 2022-10-12: 40 mg via ORAL
  Filled 2022-10-12: qty 2

## 2022-10-12 NOTE — ED Provider Triage Note (Signed)
Emergency Medicine Provider Triage Evaluation Note  Amanda [name] , a 82 y.o. female  was evaluated in triage.  Pt complains of shortness of breath and cough.  Have been present for 10 days.  Was seen 7 days ago and was prescribed doxycycline and prednisone.  Finished this course but symptoms have not improved.  Has tried nebulizers at home with minimal improvement.  Had a pulse ox reading of 90% yesterday when she is typically 98% on 3 L nasal cannula reports fevers as high as 99.8 Fahrenheit.  Review of Systems  Positive: As above Negative: As above  Physical Exam  BP 114/60 (BP Location: Right Arm)   Pulse (!) 108   Temp (!) 97.5 F (36.4 C) (Oral)   Resp 18   Ht '5\' 5"'$  (1.651 m)   Wt 49.9 kg   SpO2 97%   BMI 18.30 kg/m  Gen:   Awake, no distress   Resp:  Normal effort, 3 L nasal cannula MSK:   Moves extremities without difficulty  Other:  Mild expiratory wheezes throughout  Medical Decision Making  Medically screening exam initiated at 1:17 PM.  Appropriate orders placed.  Amanda [name] was informed that the remainder of the evaluation will be completed by another provider, this initial triage assessment does not replace that evaluation, and the importance of remaining in the ED until their evaluation is complete.     Roylene Reason, [place] 10/12/22 1319

## 2022-10-12 NOTE — ED Triage Notes (Signed)
Pt presents to ED with complaints of productive cough and congestion. Pt states she was here last week for the same. Pt chronically on 3L per .

## 2022-10-12 NOTE — Discharge Instructions (Signed)
Your testing today showed that there may be a very subtle or small pneumonia in your lung, however because of your COPD it will take a very long time for you to heal compared to other people.  Because of this I would like for you to continue taking prednisone for another 5 days and I have prescribed a medication called cefdinir which is an antibiotic that you will need to take twice a day for another 7 days.  If you should develop severe or worsening symptoms return to the ER immediately otherwise follow-up with your doctor in 3 days.

## 2022-10-12 NOTE — ED Provider Notes (Signed)
[facility] EMERGENCY DEPARTMENT Provider Note   CSN: [id number] Arrival date & time: 10/12/22  1137     History  Chief Complaint  Patient presents with   Cough    AMI Amanda [name] is a 82 y.o. female.   Cough  This patient is an 82 year old female, she has a history of COPD and emphysema taking Symbicort and Breo as Teare, she has a prescription for an albuterol inhaler, she has no prior history of hypertension diabetes high cholesterol however she has had a couple of pneumothorax episodes in the past.  She was seen approximately 1 week ago during which time she was placed on doxycycline and prednisone, she was taking all of those medications and finished them however still feels like she is coughing and having lots of phlegm.  She is on 3 L by nasal cannula chronically, she has not been febrile at home, she has not required increasing amounts of oxygen, she is 97% on arrival.  She denies any swelling of the legs    Home Medications Prior to Admission medications   Medication Sig Start Date End Date Taking? Authorizing Provider  acetaminophen (TYLENOL) 500 MG tablet Take 500-1,000 mg by mouth daily as needed for mild pain or moderate pain.     [provider]  albuterol (PROVENTIL HFA;VENTOLIN HFA) 108 (90 Base) MCG/ACT inhaler Inhale 1-2 puffs into the lungs every 6 (six) hours as needed for wheezing or shortness of breath. 09/29/18   Hayden Rasmussen, MD  ALPRAZolam Duanne Moron) 0.5 MG tablet Take 0.25 mg by mouth daily as needed for anxiety. 12/24/19   [provider]  ascorbic acid (GNP VITAMIN C) 500 MG tablet Take 500 mg by mouth daily.    [provider]  Ascorbic Acid (VITAMIN C) 500 MG CAPS Take 500 mg by mouth daily.     [provider]  Cholecalciferol (VITAMIN D-3) 125 MCG (5000 UT) TABS Take 5,000 Units by mouth daily.    [provider]  docusate sodium (COLACE) 100 MG capsule Take 1 capsule (100 mg total) by mouth 2 (two) times  daily. Patient taking differently: Take 100 mg by mouth daily.  10/19/19   Geradine Girt, DO  guaiFENesin (MUCINEX) 600 MG 12 hr tablet Take 600 mg by mouth daily.    [provider]  ipratropium-albuterol (DUONEB) 0.5-2.5 (3) MG/3ML SOLN Take 3 mLs by nebulization every 6 (six) hours as needed for wheezing or shortness of breath. 01/03/20   [provider]  Multiple Vitamin (MULTIVITAMIN WITH MINERALS) TABS tablet Take 1 tablet by mouth daily. Centrum Multivitamin A to Zinc    [provider]  SYMBICORT 80-4.5 MCG/ACT inhaler Inhale 2 puffs into the lungs 2 (two) times daily. 09/05/19   [provider]      Allergies    Sulfa antibiotics, Ciprofloxacin, Lactose, Levofloxacin, Sulfamethoxazole, Aspirin, Cephalexin, Clindamycin, Erythromycin base, Hydromorphone, and Penicillins    Review of Systems   Review of Systems  Respiratory:  Positive for cough.   All other systems reviewed and are negative.   Physical Exam Updated Vital Signs BP 114/60 (BP Location: Right Arm)   Pulse (!) 108   Temp (!) 97.5 F (36.4 C) (Oral)   Resp 18   Ht 1.651 m ('5\' 5"'$ )   Wt 49.9 kg   SpO2 97%   BMI 18.30 kg/m  Physical Exam Vitals and nursing note reviewed.  Constitutional:      General: She is not in acute distress.  Appearance: She is well-developed.  HENT:     Head: Normocephalic and atraumatic.     Mouth/Throat:     Pharynx: No oropharyngeal exudate.  Eyes:     General: No scleral icterus.       Right eye: No discharge.        Left eye: No discharge.     Conjunctiva/sclera: Conjunctivae normal.     Pupils: Pupils are equal, round, and reactive to light.  Neck:     Thyroid: No thyromegaly.     Vascular: No JVD.  Cardiovascular:     Rate and Rhythm: Regular rhythm. Tachycardia present.     Heart sounds: Normal heart sounds. No murmur heard.    No friction rub. No gallop.     Comments: Tachycardic to about 105 bpm Pulmonary:     Effort: Pulmonary  effort is normal. No respiratory distress.     Breath sounds: Wheezing and rhonchi present. No rales.     Comments: Speaks in full sentences, intermittent rhonchi in all lung fields, slight expiratory wheezing, occasional coughing with deep breathing, no distress, no accessory muscle use Abdominal:     General: Bowel sounds are normal. There is no distension.     Palpations: Abdomen is soft. There is no mass.     Tenderness: There is no abdominal tenderness.  Musculoskeletal:        General: No tenderness. Normal range of motion.     Cervical back: Normal range of motion and neck supple.     Right lower leg: No edema.     Left lower leg: No edema.  Lymphadenopathy:     Cervical: No cervical adenopathy.  Skin:    General: Skin is warm and dry.     Findings: No erythema or rash.  Neurological:     Mental Status: She is alert.     Coordination: Coordination normal.  Psychiatric:        Behavior: Behavior normal.     ED Results / Procedures / Treatments   Labs (all labs ordered are listed, but only abnormal results are displayed) Labs Reviewed  CBC WITH DIFFERENTIAL/PLATELET - Abnormal; Notable for the following components:      Result Value   Hemoglobin 11.8 (*)    Neutro Abs 8.7 (*)    All other components within normal limits  BASIC METABOLIC PANEL  BRAIN NATRIURETIC PEPTIDE  BLOOD GAS, ARTERIAL    EKG EKG Interpretation  Date/Time:  Sunday October 12 2022 14:16:07 EST Ventricular Rate:  105 PR Interval:  136 QRS Duration: 83 QT Interval:  314 QTC Calculation: 415 R Axis:   104 Text Interpretation: Sinus tachycardia Consider right atrial enlargement Probable lateral infarct, age indeterminate Confirmed by Noemi Chapel [phone]) on 10/12/2022 3:25:18 PM  Radiology DG Chest Port 1 View  Result Date: 10/12/2022 CLINICAL DATA:  Productive cough and chest congestion. EXAM: PORTABLE CHEST 1 VIEW COMPARISON:  09/29/2022 FINDINGS: The heart size and mediastinal contours are  within normal limits. Aortic atherosclerotic calcification incidentally noted. Pulmonary emphysema again noted. Left apical and right basilar scarring remains stable. Increased streaky opacity is seen in the right upper lobe, suspicious for bronchopneumonia. IMPRESSION: Increased streaky opacity in right upper lobe, suspicious for bronchopneumonia. Recommend continued chest radiographic follow-up to confirm resolution. Emphysema. Electronically Signed   By: Marlaine Hind M.D.   On: 10/12/2022 13:11    Procedures Procedures    Medications Ordered in ED Medications  cefdinir (OMNICEF) capsule 300 mg (has no administration in time range)  predniSONE (DELTASONE) tablet 40 mg (has no administration in time range)  albuterol (PROVENTIL,VENTOLIN) solution continuous neb (has no administration in time range)    ED Course/ Medical Decision Making/ A&P                     PORT Score: 79        Medical Decision Making Amount and/or Complexity of Data Reviewed Radiology: ordered.  Risk Prescription drug management.   This patient presents to the ED for concern of persistent cough, this involves an extensive number of treatment options, and is a complaint that carries with it a high risk of complications and morbidity.  The differential diagnosis includes recurrent pneumothorax, pneumonia, bronchitis, pleural effusion, less likely to be cardiac abnormalities.   Co morbidities that complicate the patient evaluation  Chronic COPD and oxygen dependent emphysema   Additional history obtained:  Additional history obtained from electronic medical record External records from outside source obtained and reviewed including last admitted to the hospital for COPD in April 2023   Lab Tests:  I Ordered, and personally interpreted labs.  The pertinent results include: CBC metabolic panel viral panel   Imaging Studies ordered:  I ordered imaging studies including chest x-ray I independently  visualized and interpreted imaging which showed streaking suggestive of bronchopneumonia I agree with the radiologist interpretation   Cardiac Monitoring: / EKG:  The patient was maintained on a cardiac monitor.  I personally viewed and interpreted the cardiac monitored which showed an underlying rhythm of: Sinus tachycardia   Problem List / ED Course / Critical interventions / Medication management  The patient has some subtle findings of pneumonia on the x-ray thus I ordered a medicine and she has tolerated this well I ordered medication including cefdinir for pneumonia Reevaluation of the patient after these medicines showed that the patient improved, stable, no hypoxia no tachypnea and no tachycardia at the time of discharge I have reviewed the patients home medicines and have made adjustments as needed   Social Determinants of Health:  Well-appearing, chronic COPD but improved, on home oxygen and does not need an increased level of oxygen at this time, she is 98% on 3 L here   Test / Admission - Considered:  Considered admission but the patient is well-appearing, the family member is comfortable taking her home, she has no signs of pneumothorax, she has tolerated cefdinir despite a history of allergy to cephalexin.  I have watched her for several hours after the medicine and she has not developed a rash itching or any other symptoms Patient and family expressed understanding to the indications for return         Final Clinical Impression(s) / ED Diagnoses Final diagnoses:  Chronic obstructive pulmonary disease, unspecified COPD type [facility])    Rx / DC Orders ED Discharge Orders     None         Noemi Chapel, MD 10/12/22 1910

## 2023-07-05 ENCOUNTER — Other Ambulatory Visit: Payer: Self-pay

## 2023-07-05 ENCOUNTER — Emergency Department (HOSPITAL_COMMUNITY): Payer: Medicare PPO

## 2023-07-05 ENCOUNTER — Emergency Department (HOSPITAL_COMMUNITY)
Admission: EM | Admit: 2023-07-05 | Discharge: 2023-07-05 | Disposition: A | Discharge status: Home / Self Care | Payer: Medicare PPO

## 2023-07-05 ENCOUNTER — Encounter (HOSPITAL_COMMUNITY): Payer: Self-pay

## 2023-07-05 DIAGNOSIS — J441 Chronic obstructive pulmonary disease with (acute) exacerbation: Secondary | ICD-10-CM | POA: Insufficient documentation

## 2023-07-05 DIAGNOSIS — R0602 Shortness of breath: Secondary | ICD-10-CM | POA: Diagnosis present

## 2023-07-05 DIAGNOSIS — Z20822 Contact with and (suspected) exposure to covid-19: Secondary | ICD-10-CM | POA: Diagnosis not present

## 2023-07-05 LAB — COMPREHENSIVE METABOLIC PANEL
ALT: 10 U/L (ref 0–44)
AST: 15 U/L (ref 15–41)
Albumin: 3.5 g/dL (ref 3.5–5.0)
Alkaline Phosphatase: 66 U/L (ref 38–126)
Anion gap: 8 (ref 5–15)
BUN: 18 mg/dL (ref 8–23)
CO2: 26 mmol/L (ref 22–32)
Calcium: 9.1 mg/dL (ref 8.9–10.3)
Chloride: 103 mmol/L (ref 98–111)
Creatinine, Ser: 0.98 mg/dL (ref 0.44–1.00)
GFR, Estimated: 58 mL/min — ABNORMAL LOW (ref >60)
Glucose, Bld: 104 mg/dL — ABNORMAL HIGH (ref 70–99)
Potassium: 3.9 mmol/L (ref 3.5–5.1)
Sodium: 137 mmol/L (ref 135–145)
Total Bilirubin: 1 mg/dL (ref 0.3–1.2)
Total Protein: 7 g/dL (ref 6.5–8.1)

## 2023-07-05 LAB — LACTIC ACID, PLASMA: Lactic Acid, Venous: 2.1 mmol/L (ref 0.5–1.9)

## 2023-07-05 LAB — CBC
HCT: 40.2 % (ref 36.0–46.0)
Hemoglobin: 12.9 g/dL (ref 12.0–15.0)
MCH: 30.6 pg (ref 26.0–34.0)
MCHC: 32.1 g/dL (ref 30.0–36.0)
MCV: 95.3 fL (ref 80.0–100.0)
Platelets: 202 10*3/uL (ref 150–400)
RBC: 4.22 MIL/uL (ref 3.87–5.11)
RDW: 13.1 % (ref 11.5–15.5)
WBC: 9.2 10*3/uL (ref 4.0–10.5)
nRBC: 0 % (ref 0.0–0.2)

## 2023-07-05 LAB — RESP PANEL BY RT-PCR (RSV, FLU A&B, COVID)  RVPGX2
Influenza A by PCR: NEGATIVE
Influenza B by PCR: NEGATIVE
Resp Syncytial Virus by PCR: NEGATIVE
SARS Coronavirus 2 by RT PCR: NEGATIVE

## 2023-07-05 LAB — LIPASE, BLOOD: Lipase: 29 U/L (ref 11–51)

## 2023-07-05 MED ORDER — METHYLPREDNISOLONE SODIUM SUCC 125 MG IJ SOLR
125.0000 mg | Freq: Once | INTRAMUSCULAR | Status: AC
  Administered 2023-07-05: 125 mg via INTRAVENOUS
  Filled 2023-07-05: qty 2

## 2023-07-05 MED ORDER — PREDNISONE 20 MG PO TABS: ORAL_TABLET | ORAL | 0 refills | Status: DC

## 2023-07-05 MED ORDER — SODIUM CHLORIDE 0.9 % IV BOLUS
1000.0000 mL | Freq: Once | INTRAVENOUS | Status: AC
  Administered 2023-07-05: 1000 mL via INTRAVENOUS

## 2023-07-05 MED ORDER — IPRATROPIUM-ALBUTEROL 0.5-2.5 (3) MG/3ML IN SOLN
3.0000 mL | Freq: Once | RESPIRATORY_TRACT | Status: AC
  Administered 2023-07-05: 3 mL via RESPIRATORY_TRACT
  Filled 2023-07-05: qty 3

## 2023-07-05 NOTE — ED Provider Notes (Signed)
Care transferred to me.  Patient is feeling better and feels good enough to go.  She reports chronic dyspnea and chronic trouble walking due to her legs giving out from shortness of breath.  However this is not new or worse since getting treatment here.  She also notes that her heart rate is always high though it has come down to about 105 and she feels better.  Given this she would like to go home and I will give her a burst of steroids and refer her to pulmonology as she states she has never seen a pulmonologist.  Will discharge home with return precautions.   Pricilla Loveless, MD 07/05/23 [phone]

## 2023-07-05 NOTE — ED Triage Notes (Addendum)
C/o productive cough, headache, body aches, chills x1 week.  Nebs, mucinex at home w/o relief.  Hx copd.  Family in house have been sick with same s/sy On 3lpm [place] baseline at home

## 2023-07-05 NOTE — ED Provider Notes (Signed)
[name] EMERGENCY DEPARTMENT AT [facility] Provider Note   CSN: [id number] Arrival date & time: 07/05/23  1132     History {Add pertinent medical, surgical, social history, OB history to HPI:1} No chief complaint on file.   Amanda [name] is a 82 y.o. female.  Presented to emergency department for shortness of breath.  Patient has had viral syndrome type complaints with generalized malaise, rhinorrhea, body aches, nausea with decreased appetite, but no vomiting or diarrhea.  Daughter notes that mother oxygen saturation seemingly 92% on her home 3 L of oxygen.  Did breathing treatment this morning.  Patient denies chest pain.  HPI     Home Medications Prior to Admission medications   Medication Sig Start Date End Date Taking? Authorizing Provider  acetaminophen (TYLENOL) 500 MG tablet Take 500-1,000 mg by mouth daily as needed for mild pain or moderate pain.     [provider]  albuterol (PROVENTIL HFA;VENTOLIN HFA) 108 (90 Base) MCG/ACT inhaler Inhale 1-2 puffs into the lungs every 6 (six) hours as needed for wheezing or shortness of breath. 09/29/18   Terrilee Files, MD  ALPRAZolam Prudy Feeler) 0.5 MG tablet Take 0.25 mg by mouth daily as needed for anxiety. 12/24/19   [provider]  ascorbic acid (GNP VITAMIN C) 500 MG tablet Take 500 mg by mouth daily.    [provider]  Ascorbic Acid (VITAMIN C) 500 MG CAPS Take 500 mg by mouth daily.     [provider]  Cholecalciferol (VITAMIN D-3) 125 MCG (5000 UT) TABS Take 5,000 Units by mouth daily.    [provider]  docusate sodium (COLACE) 100 MG capsule Take 1 capsule (100 mg total) by mouth 2 (two) times daily. Patient taking differently: Take 100 mg by mouth daily.  10/19/19   Joseph Art, DO  guaiFENesin (MUCINEX) 600 MG 12 hr tablet Take 600 mg by mouth daily.    [provider]  ipratropium-albuterol (DUONEB) 0.5-2.5 (3) MG/3ML SOLN Take 3 mLs by nebulization  every 6 (six) hours as needed for wheezing or shortness of breath. 01/03/20   [provider]  Multiple Vitamin (MULTIVITAMIN WITH MINERALS) TABS tablet Take 1 tablet by mouth daily. Centrum Multivitamin A to Zinc    [provider]  predniSONE (DELTASONE) 20 MG tablet Take 2 tablets (40 mg total) by mouth daily. 10/12/22   Eber Hong, MD  SYMBICORT 80-4.5 MCG/ACT inhaler Inhale 2 puffs into the lungs 2 (two) times daily. 09/05/19   [provider]      Allergies    Misc. sulfonamide containing compounds, Sulfa antibiotics, Ciprofloxacin, Lactose, Levofloxacin, Sulfamethoxazole, Aspirin, Cephalexin, Clindamycin, Erythromycin base, Hydromorphone, and Penicillins    Review of Systems   Review of Systems  Physical Exam Updated Vital Signs BP (!) 118/55   Pulse (!) 115   Temp 98.3 F (36.8 C)   Resp (!) 22   Wt 52.2 kg   SpO2 97%   BMI 19.14 kg/m  Physical Exam Vitals and nursing note reviewed.  HENT:     Head: Normocephalic.     Nose: Nose normal.     Mouth/Throat:     Mouth: Mucous membranes are dry.  Eyes:     Conjunctiva/sclera: Conjunctivae normal.  Cardiovascular:     Rate and Rhythm: Regular rhythm. Tachycardia present.  Pulmonary:     Effort: Pulmonary effort is normal. No respiratory distress.     Breath sounds: Wheezing present.  Abdominal:     General:  Abdomen is flat. There is no distension.     Tenderness: There is no abdominal tenderness. There is no guarding or rebound.  Musculoskeletal:        General: Normal range of motion.     Cervical back: Normal range of motion.  Skin:    General: Skin is warm and dry.     Capillary Refill: Capillary refill takes less than 2 seconds.  Neurological:     Mental Status: She is alert and oriented to person, place, and time.  Psychiatric:        Mood and Affect: Mood normal.        Behavior: Behavior normal.     ED Results / Procedures / Treatments   Labs (all labs ordered are listed, but  only abnormal results are displayed) Labs Reviewed  COMPREHENSIVE METABOLIC PANEL - Abnormal; Notable for the following components:      Result Value   Glucose, Bld 104 (*)    GFR, Estimated 58 (*)    All other components within normal limits  LACTIC ACID, PLASMA - Abnormal; Notable for the following components:   Lactic Acid, Venous 2.1 (*)    All other components within normal limits  RESP PANEL BY RT-PCR (RSV, FLU A&B, COVID)  RVPGX2  CBC  LIPASE, BLOOD    EKG EKG Interpretation Date/Time:  Sunday July 05 2023 12:09:53 EDT Ventricular Rate:  107 PR Interval:  153 QRS Duration:  78 QT Interval:  305 QTC Calculation: 407 R Axis:   78  Text Interpretation: Sinus tachycardia Right atrial enlargement RSR' in V1 or V2, probably normal variant Confirmed by Estanislado Pandy [phone]) on 07/05/2023 2:07:10 PM  Radiology DG Chest 2 View  Result Date: 07/05/2023 CLINICAL DATA:  Cough and shortness of breath EXAM: CHEST - 2 VIEW COMPARISON:  X-ray 12/29/2022 and older FINDINGS: Hyperinflation. Calcified aorta. Blunting of the costophrenic angles. Tiny effusions. Left apical pleural thickening greater than right. Diffuse interstitial changes are again seen, likely chronic. No consolidation, edema or pneumothorax. Overlapping cardiac leads. There is moderate compression deformity of the midthoracic spine levels, unchanged from previous. Osteopenia. IMPRESSION: Hyperinflation with chronic changes.  Tiny pleural effusions. Electronically Signed   By: Karen Kays M.D.   On: 07/05/2023 14:03    Procedures Procedures  {Document cardiac monitor, telemetry assessment procedure when appropriate:1}  Medications Ordered in ED Medications  sodium chloride 0.9 % bolus 1,000 mL (has no administration in time range)  ipratropium-albuterol (DUONEB) 0.5-2.5 (3) MG/3ML nebulizer solution 3 mL (3 mLs Nebulization Given 07/05/23 1222)  methylPREDNISolone sodium succinate (SOLU-MEDROL) 125 mg/2 mL injection  125 mg (125 mg Intravenous Given 07/05/23 1250)    ED Course/ Medical Decision Making/ A&P Clinical Course as of 07/05/23 1424  Sun Jul 05, 2023  1415 DG Chest 2 View IMPRESSION: Hyperinflation with chronic changes.  Tiny pleural effusions.   [TY]  1423 Reevaluated, continues to feel improved.  Heart rate however still in the 1 teens.  Suspect decreased p.o. intake as well as medication side effect.  Will give IV fluids and monitor [TY]    Clinical Course User Index [TY] Coral Spikes, DO   {   Click here for ABCD2, HEART and other calculatorsREFRESH Note before signing :1}                              Medical Decision Making Is a well-appearing 83 year old female with history of COPD on 2 L  of oxygen presenting emergency department with shortness of breath.  She is tachycardic on arrival, but hemodynamically stable.  She is maintaining oxygen saturation on her home 3 L.  Does not appear to be in overt respiratory distress.  Will get basic screening labs and give steroid/breathing treatment for presumed COPD exacerbation.  See ED course for further MDM disposition.  Amount and/or Complexity of Data Reviewed Independent Historian:     Details: Daughter notes patient has had decline since COVID in 2020 with her respiratory status and her functional status at External Data Reviewed:     Details: Per chart review seen in the emergency department several times for COPD exacerbation. Labs: ordered. Decision-making details documented in ED Course. Radiology: ordered. Decision-making details documented in ED Course. ECG/medicine tests: ordered. Decision-making details documented in ED Course.    Details: Appears to be sinus tachycardia with no ST segment changes to indicate ischemia.  Risk Prescription drug management.   ***  {Document critical care time when appropriate:1} {Document review of labs and clinical decision tools ie heart score, Chads2Vasc2 etc:1}  {Document your  independent review of radiology images, and any outside records:1} {Document your discussion with family members, caretakers, and with consultants:1} {Document social determinants of health affecting pt's care:1} {Document your decision making why or why not admission, treatments were needed:1} Final Clinical Impression(s) / ED Diagnoses Final diagnoses:  None    Rx / DC Orders ED Discharge Orders     None

## 2023-07-05 NOTE — Discharge Instructions (Signed)
Start the steroid prescription tomorrow.  Continue to use your albuterol and other lung medicines as prescribed.  If you develop new or worsening shortness of breath, coughing up blood, fever, chest pain, or any other new/concerning symptoms then return to the ER or call 911.

## 2024-07-30 ENCOUNTER — Emergency Department (HOSPITAL_COMMUNITY)
Admission: EM | Admit: 2024-07-30 | Discharge: 2024-07-30 | Disposition: A | Discharge status: Home / Self Care | Attending: Emergency Medicine | Admitting: Emergency Medicine

## 2024-07-30 ENCOUNTER — Emergency Department (HOSPITAL_COMMUNITY)

## 2024-07-30 ENCOUNTER — Encounter (HOSPITAL_COMMUNITY): Payer: Self-pay | Admitting: Emergency Medicine

## 2024-07-30 ENCOUNTER — Other Ambulatory Visit: Payer: Self-pay

## 2024-07-30 DIAGNOSIS — R059 Cough, unspecified: Secondary | ICD-10-CM | POA: Diagnosis present

## 2024-07-30 DIAGNOSIS — N3000 Acute cystitis without hematuria: Secondary | ICD-10-CM | POA: Diagnosis not present

## 2024-07-30 DIAGNOSIS — J069 Acute upper respiratory infection, unspecified: Secondary | ICD-10-CM | POA: Diagnosis present

## 2024-07-30 DIAGNOSIS — R051 Acute cough: Secondary | ICD-10-CM

## 2024-07-30 DIAGNOSIS — J449 Chronic obstructive pulmonary disease, unspecified: Secondary | ICD-10-CM | POA: Insufficient documentation

## 2024-07-30 DIAGNOSIS — Z7951 Long term (current) use of inhaled steroids: Secondary | ICD-10-CM | POA: Diagnosis not present

## 2024-07-30 DIAGNOSIS — R410 Disorientation, unspecified: Secondary | ICD-10-CM | POA: Insufficient documentation

## 2024-07-30 DIAGNOSIS — R519 Headache, unspecified: Secondary | ICD-10-CM | POA: Diagnosis not present

## 2024-07-30 DIAGNOSIS — R0602 Shortness of breath: Secondary | ICD-10-CM | POA: Diagnosis present

## 2024-07-30 DIAGNOSIS — Z8551 Personal history of malignant neoplasm of bladder: Secondary | ICD-10-CM | POA: Diagnosis not present

## 2024-07-30 DIAGNOSIS — Z9981 Dependence on supplemental oxygen: Secondary | ICD-10-CM | POA: Insufficient documentation

## 2024-07-30 DIAGNOSIS — J0110 Acute frontal sinusitis, unspecified: Secondary | ICD-10-CM | POA: Diagnosis not present

## 2024-07-30 DIAGNOSIS — F0390 Unspecified dementia without behavioral disturbance: Secondary | ICD-10-CM | POA: Diagnosis not present

## 2024-07-30 DIAGNOSIS — R0981 Nasal congestion: Secondary | ICD-10-CM

## 2024-07-30 LAB — URINALYSIS, ROUTINE W REFLEX MICROSCOPIC
Bilirubin Urine: NEGATIVE
Glucose, UA: NEGATIVE mg/dL
Ketones, ur: NEGATIVE mg/dL
Leukocytes,Ua: NEGATIVE
Nitrite: NEGATIVE
Protein, ur: NEGATIVE mg/dL
Specific Gravity, Urine: 1.023 (ref 1.005–1.030)
pH: 5 (ref 5.0–8.0)

## 2024-07-30 LAB — CBC
HCT: 38.1 % (ref 36.0–46.0)
Hemoglobin: 12.4 g/dL (ref 12.0–15.0)
MCH: 30.8 pg (ref 26.0–34.0)
MCHC: 32.5 g/dL (ref 30.0–36.0)
MCV: 94.5 fL (ref 80.0–100.0)
Platelets: 219 K/uL (ref 150–400)
RBC: 4.03 MIL/uL (ref 3.87–5.11)
RDW: 13.6 % (ref 11.5–15.5)
WBC: 8.6 K/uL (ref 4.0–10.5)
nRBC: 0 % (ref 0.0–0.2)

## 2024-07-30 LAB — BASIC METABOLIC PANEL WITH GFR
Anion gap: 10 (ref 5–15)
BUN: 16 mg/dL (ref 8–23)
CO2: 25 mmol/L (ref 22–32)
Calcium: 9.1 mg/dL (ref 8.9–10.3)
Chloride: 104 mmol/L (ref 98–111)
Creatinine, Ser: 0.83 mg/dL (ref 0.44–1.00)
GFR, Estimated: >60 mL/min (ref >60)
Glucose, Bld: 95 mg/dL (ref 70–99)
Potassium: 3.4 mmol/L — ABNORMAL LOW (ref 3.5–5.1)
Sodium: 139 mmol/L (ref 135–145)

## 2024-07-30 LAB — RESP PANEL BY RT-PCR (RSV, FLU A&B, COVID)  RVPGX2
Influenza A by PCR: NEGATIVE
Influenza B by PCR: NEGATIVE
Resp Syncytial Virus by PCR: NEGATIVE
SARS Coronavirus 2 by RT PCR: NEGATIVE

## 2024-07-30 MED ORDER — DOXYCYCLINE HYCLATE 100 MG PO CAPS: 100.0000 mg | ORAL_CAPSULE | Freq: Two times a day (BID) | ORAL | 0 refills | Status: AC

## 2024-07-30 MED ORDER — SODIUM CHLORIDE 0.9 % IV BOLUS
500.0000 mL | Freq: Once | INTRAVENOUS | Status: AC
  Administered 2024-07-30: 500 mL via INTRAVENOUS

## 2024-07-30 MED ORDER — IBUPROFEN 400 MG PO TABS
400.0000 mg | ORAL_TABLET | Freq: Once | ORAL | Status: AC
  Administered 2024-07-30: 400 mg via ORAL
  Filled 2024-07-30: qty 1

## 2024-07-30 MED ORDER — DOXYCYCLINE HYCLATE 100 MG PO TABS
100.0000 mg | ORAL_TABLET | Freq: Once | ORAL | Status: AC
  Administered 2024-07-30: 100 mg via ORAL
  Filled 2024-07-30: qty 1

## 2024-07-30 MED ORDER — BENZONATATE 100 MG PO CAPS: 100.0000 mg | ORAL_CAPSULE | Freq: Three times a day (TID) | ORAL | 0 refills | Status: DC | PRN

## 2024-07-30 MED ORDER — ACETAMINOPHEN 325 MG PO TABS
650.0000 mg | ORAL_TABLET | Freq: Once | ORAL | Status: AC
  Administered 2024-07-30: 650 mg via ORAL
  Filled 2024-07-30: qty 2

## 2024-07-30 NOTE — Discharge Instructions (Addendum)
 It was a pleasure taking care of you today. Based on your history and physical exam I feel you are stable for discharge. Today your chest xray did not show any evidence of pneumonia, however due to your symptoms with productive cough you have been prescribed an antibiotic called doxycyline. Please take this medication as prescribed and complete the entire course. Today the CT scan of your head was reassuring. Please continue supportive care at home for cough and congestion, I have sent in some tessalon perles to help with cough.  If experiencing the following symptoms including but not limited to fever, chills, chest pain, shortness of breath, severe weakness, severe dehydration, or other concerning symptom please return to the emergency department or seek further medical care.  Please continue your course of Macrobid that was previously prescribed by your doctor, your urine has been cultured today and you will be contacted if an antibiotic change is needed.  If symptoms persist or worsen recommend follow-up within 48 hours with your primary care or back in the ED.

## 2024-07-30 NOTE — ED Notes (Signed)
 X-ray at bedside.

## 2024-07-30 NOTE — ED Provider Notes (Signed)
 Patient was signed out to myself at shift change pending CT scan and reevaluation.  Patient presented with a chief complaint of right sided headache and some changes in mentation.  Patient has noticed pain behind the right eye, sinus congestion, watery eyes.  Patient had chest x-ray which demonstrated no acute findings and negative blood work and negative viral swab. Physical Exam  BP 97/62   Pulse 95   Temp 98 F (36.7 C) (Oral)   Resp 16   Ht 5' 5 (1.651 m)   Wt 44.5 kg   SpO2 100%   BMI 16.31 kg/m   Physical Exam  Procedures  Procedures  ED Course / MDM    Medical Decision Making Amount and/or Complexity of Data Reviewed Labs: ordered. Radiology: ordered.  Risk OTC drugs. Prescription drug management.   CT of the head has returned and does demonstrate right sided frontal sinusitis.  Will treat accordingly with antibiotics at this time.  Patient has no indication for sepsis at this point and is otherwise mentating at her baseline.  Plan was fully discussed with daughter who is in agreement to plan at this time.  Urine has been sent for culture as well and will await these results.  She is already on Macrobid at this point.  Close follow-up with PCP was discussed as well as strict turn precautions for any new or worsening symptoms.  Patient and daughter voiced understanding and had no additional questions.       Daralene Lonni BIRCH, PA-C 07/30/24 2030    Suzette Pac, MD 07/31/24 1013

## 2024-07-30 NOTE — ED Triage Notes (Addendum)
 Pt to ed pov c/o right sided eye/head pain. Family member states pt started getting sick weeks ago. Pt still coughing up quarter size phlegm balls, eyes watery, sore throat, congested.  Pt last took 1000mg  tylenol  30 min ago and mucinex   0830 and Sudafed. Currently on macrobid for UTI with 6 more days left. NIH done in triage= 0 Pt wears 3L O2 at home

## 2024-07-30 NOTE — ED Provider Notes (Cosign Needed)
 [place] EMERGENCY DEPARTMENT AT [facility] Provider Note   CSN: [id number] Arrival date & time: 07/30/24  1459     Patient presents with: Eye Pain (right)   Amanda [name] is a 83 y.o. female who presents to the emergency department with a chief complaint of upper respiratory tract infection-like symptoms for approximately 3 weeks.  Patient states that she started getting sick a few weeks ago and that her symptoms have persisted.  Daughter who is at bedside states that she was recently sick and her son was also recently sick.  Patient states that she has a productive cough and is coughing up green/yellow mucus.  She also appreciates that her eyes are watering.  States that her throat was sore for 1 day however is not currently sore anymore.  And that she has been experiencing some congestion.  She also has a headache that takes place directly behind her right eye and is quite severe at times.  Denies new visual disturbances or new issues with balance or coordination.  Patient denies trauma/injury to head.  Patient is currently on a course of Macrobid for a urinary tract infection, this is the second course of Macrobid that the patient has completed recently.  Patient wears 3 L of nasal cannula oxygen  at home at baseline.  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 denies known sick contacts.  Denies fever, chills, chest pain, shortness of breath, abdominal pain, nausea, vomiting, or urinary tract symptoms.  Daughter who is at bedside states that patient has had some episodes of confusion recently and this is why she began being treated for a urinary tract infection.  Daughter states that these episodes still occur at times with the patient thinking that people are there when they are really not.    Eye Pain       Prior to Admission medications   Medication Sig  Start Date End Date Taking? Authorizing Provider  benzonatate (TESSALON) 100 MG capsule Take 1 capsule (100 mg total) by mouth 3 (three) times daily as needed for cough. 07/30/24  Yes Ziyan Hillmer F, PA-C  doxycycline  (VIBRAMYCIN ) 100 MG capsule Take 1 capsule (100 mg total) by mouth 2 (two) times daily for 5 days. 07/30/24 08/04/24 Yes Berneda Piccininni F, PA-C  acetaminophen  (TYLENOL ) 500 MG tablet Take 500-1,000 mg by mouth daily as needed for mild pain or moderate pain.     [provider]  albuterol  (PROVENTIL  HFA;VENTOLIN  HFA) 108 (90 Base) MCG/ACT inhaler Inhale 1-2 puffs into the lungs every 6 (six) hours as needed for wheezing or shortness of breath. 09/29/18   Towana Ozell BROCKS, MD  ALPRAZolam  (XANAX ) 0.5 MG tablet Take 0.25 mg by mouth daily as needed for anxiety. 12/24/19   [provider]  ascorbic acid  (GNP VITAMIN C ) 500 MG tablet Take 500 mg by mouth daily.    [provider]  Ascorbic Acid  (VITAMIN C ) 500 MG CAPS Take 500 mg by mouth daily.     [provider]  Cholecalciferol (VITAMIN D -3) 125 MCG (5000 UT) TABS Take 5,000 Units by mouth daily.    [provider]  docusate sodium  (COLACE) 100 MG capsule Take 1 capsule (100 mg total) by mouth 2 (two) times daily. Patient taking differently: Take 100 mg by mouth daily.  10/19/19   Vann, Jessica U, DO  guaiFENesin  (MUCINEX ) 600 MG 12 hr tablet Take 600 mg by mouth daily.  [provider]  ipratropium-albuterol  (DUONEB) 0.5-2.5 (3) MG/3ML SOLN Take 3 mLs by nebulization every 6 (six) hours as needed for wheezing or shortness of breath. 01/03/20   [provider]  Multiple Vitamin (MULTIVITAMIN WITH MINERALS) TABS tablet Take 1 tablet by mouth daily. Centrum Multivitamin A to Zinc    [provider]  predniSONE  (DELTASONE ) 20 MG tablet 2 tabs po daily x 4 days 07/05/23   Freddi Hamilton, MD  [facility] 80-4.5 MCG/ACT inhaler Inhale 2 puffs into the lungs 2 (two) times daily.  09/05/19   [provider]    Allergies: Misc. sulfonamide containing compounds, Sulfa antibiotics, Ciprofloxacin, Lactose, Levofloxacin, Sulfamethoxazole, Aspirin, Cephalexin, Clindamycin, Erythromycin base, Hydromorphone, and Penicillins    Review of Systems  Eyes:  Positive for pain.  Respiratory:  Positive for cough.     Updated Vital Signs BP 99/62   Pulse 88   Temp 98 F (36.7 C) (Oral)   Resp 18   Ht 5' 5 (1.651 m)   Wt 44.5 kg   SpO2 100%   BMI 16.31 kg/m   Physical Exam Vitals and nursing note reviewed.  Constitutional:      General: She is awake. She is not in acute distress.    Appearance: She is not ill-appearing, toxic-appearing or diaphoretic.     Comments: Chronically ill-appearing  HENT:     Head: Normocephalic and atraumatic.     Comments: No Battle sign, no raccoon eyes, no bruising, lacerations, or erythema surrounding the right eye    Right Ear: Tympanic membrane, ear canal and external ear normal. There is no impacted cerumen.     Left Ear: Tympanic membrane, ear canal and external ear normal. There is no impacted cerumen.     Nose: Rhinorrhea present.     Mouth/Throat:     Mouth: Mucous membranes are dry.     Pharynx: No oropharyngeal exudate or posterior oropharyngeal erythema.  Eyes:     General: No scleral icterus.    Extraocular Movements: Extraocular movements intact.     Conjunctiva/sclera: Conjunctivae normal.     Pupils: Pupils are equal, round, and reactive to light.  Cardiovascular:     Rate and Rhythm: Normal rate and regular rhythm.  Pulmonary:     Effort: Pulmonary effort is normal. No respiratory distress.     Breath sounds: No stridor. Rhonchi present. No wheezing or rales.     Comments: Patient on 3 L of nasal cannula oxygen  which is her baseline, O2 saturation around 100% Musculoskeletal:        General: Normal range of motion.     Cervical back: Normal range of motion.     Right lower leg: No edema.     Left lower  leg: No edema.  Skin:    General: Skin is warm.     Capillary Refill: Capillary refill takes less than 2 seconds.  Neurological:     Mental Status: She is alert.     Comments: Patient hard of hearing, question patient on what city she was currently in -patient responded with [address]25 however after redirection eventually stated the correct city. Patient was able to tell me her correct age and full name.   Grossly normal ROM of all 4 extremities with no deficit in strength noted  Psychiatric:        Mood and Affect: Mood normal.        Behavior: Behavior normal. Behavior is cooperative.     (all labs ordered are listed, but only  abnormal results are displayed) Labs Reviewed  BASIC METABOLIC PANEL WITH GFR - Abnormal; Notable for the following components:      Result Value   Potassium 3.4 (*)    All other components within normal limits  URINALYSIS, ROUTINE W REFLEX MICROSCOPIC - Abnormal; Notable for the following components:   APPearance HAZY (*)    Hgb urine dipstick SMALL (*)    Bacteria, UA MANY (*)    All other components within normal limits  RESP PANEL BY RT-PCR (RSV, FLU A&B, COVID)  [id number]  URINE CULTURE  CBC    EKG: None  Radiology: CT Head Wo Contrast Result Date: 07/30/2024 CLINICAL DATA:  Right-sided head and eye pain, confusion, altered level of consciousness EXAM: CT HEAD WITHOUT CONTRAST TECHNIQUE: Contiguous axial images were obtained from the base of the skull through the vertex without intravenous contrast. RADIATION DOSE REDUCTION: This exam was performed according to the departmental dose-optimization program which includes automated exposure control, adjustment of the mA and/or kV according to patient size and/or use of iterative reconstruction technique. COMPARISON:  None Available. FINDINGS: Brain: No acute infarct or hemorrhage. Lateral ventricles and midline structures are unremarkable. No acute extra-axial fluid collections. No mass effect. Vascular: No  hyperdense vessel or unexpected calcification. Skull: Normal. Negative for fracture or focal lesion. Sinuses/Orbits: Opacification of the right frontal sinus and right anterior ethmoid air cells. Remaining paranasal sinuses are clear. Other: None. IMPRESSION: 1. No acute intracranial process. 2. Right frontal and ethmoid sinus disease. Electronically Signed   By: Ozell Daring M.D.   On: 07/30/2024 20:20   DG Chest Portable 1 View Result Date: 07/30/2024 EXAM: 1 VIEW(S) XRAY OF THE CHEST 07/30/2024 06:46:43 PM COMPARISON: 07/05/2023 CLINICAL HISTORY: shortness of breath, cough FINDINGS: LUNGS AND PLEURA: Severe emphysematous changes. Scarring in the lung bases and apices. No pulmonary edema. No pleural effusion. No pneumothorax. HEART AND MEDIASTINUM: Aortic atherosclerosis. No acute abnormality of the cardiac silhouette. BONES AND SOFT TISSUES: No acute osseous abnormality. IMPRESSION: 1. Severe emphysematous changes. 2. Scarring in the lung bases and apices. 3. No acute findings. Electronically signed by: Franky Crease MD 07/30/2024 07:01 PM EDT RP Workstation: [id number]     Procedures   Medications Ordered in the ED  sodium chloride  0.9 % bolus 500 mL (0 mLs Intravenous Stopped 07/30/24 1932)  acetaminophen  (TYLENOL ) tablet 650 mg (650 mg Oral Given 07/30/24 1905)  doxycycline  (VIBRA -TABS) tablet 100 mg (100 mg Oral Given 07/30/24 2049)  ibuprofen (ADVIL) tablet 400 mg (400 mg Oral Given 07/30/24 2058)                                    Medical Decision Making Amount and/or Complexity of Data Reviewed Labs: ordered. Radiology: ordered.  Risk OTC drugs. Prescription drug management.   Patient presents to the ED for concern of cough, congestion, headache, URI symptoms, this involves an extensive number of treatment options, and is a complaint that carries with it a high risk of complications and morbidity.  The differential diagnosis includes viral syndrome, sinusitis, pneumonia, COVID,  flu, RSV, etc.   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  The pertinent results include: CBC unremarkable-no elevated white blood cell  count, hemoglobin appears stable, BMP remarkable only for slightly decreased potassium at 3.4, no elevated anion gap, no elevated creatinine, bicarb unremarkable, urinalysis is consistent for many bacteria with 11-20 white blood cells, small amount of hemoglobin, hazy appearance however this urinalysis was obtained from urostomy bag, plan for urine culture, COVID, flu, RSV negative   Imaging Studies ordered:  I ordered imaging studies including chest x-ray I independently visualized and interpreted imaging which showed changes consistent with history of emphysema, lung scarring, no acute process I agree with the radiologist interpretation   Cardiac Monitoring:  The patient was maintained on a cardiac monitor.  I personally viewed and interpreted the cardiac monitored which showed an underlying rhythm of: Sinus rhythm   Medicines ordered and prescription drug management:  I ordered medication including fluids for possible dehydration, tylenol  for headache/pain  Reevaluation of the patient after these medicines showed that the patient improved I have reviewed the patients home medicines and have made adjustments as needed   Test Considered:  None   Critical Interventions:  None   Problem List / ED Course:  83 year old female, history of emphysema presents to the emergency department with a chief complaint of cough, congestion, as well as severe headaches that been ongoing for approximately 3 weeks, recent sick contacts with family, vital signs stable On physical  exam patient overall well-appearing and is active and alert, stable on her baseline 3 L of nasal cannula oxygen , mild rhonchi present with auscultation of lungs, patient grossly neurologically intact however did have some difficulty answering orientation questions however improved after redirection Will obtain general labs, repeat urinalysis and culture, respiratory panel, chest x-ray and CT of head Lab work overall reassuring, urine is consistent with urinary tract infection however source  of collection was urostomy bag, patient is already being treated with Macrobid at this time, will plan for urine culture, will not make any changes to antibiotics at this time as patient is in the middle of course Chest x-ray reassuring CT head pending at this time, low clinical suspicion for acute intracranial process however due to daughter stating patient has had a change in mental status recently and patient having some difficulty with orientation questions, wanted to rule out the possibility of an acute intracranial process Signed out to oncoming PA-C Lonni Conger with plan to follow CT results and if negative discharge patient with antibiotics to cover for possible pneumonia as patient has had productive cough for approximately 3 weeks now, also give instructions on symptomatic treatment   Reevaluation:  Patient signed out to oncoming PA-C Lonni Conger who assumes care over this patient, further work-up and evaluation   Social Determinants of Health:  none   Dispostion:  Patient signed out to oncoming PA-C Lonni Conger who assumes care over this patient, further work-up and disposition, abx and tessalon perles sent     Final diagnoses:  Acute cough  Acute nonintractable headache, unspecified headache type  Congestion of nasal sinus  Acute cystitis without hematuria  Acute non-recurrent frontal sinusitis    ED Discharge Orders          Ordered    doxycycline  (VIBRAMYCIN )  100 MG capsule  2 times daily        07/30/24 2006    benzonatate (TESSALON) 100 MG capsule  3 times daily PRN        07/30/24 2006               Joyous Gleghorn F, PA-C 07/30/24 2157

## 2024-08-02 LAB — URINE CULTURE: Culture: >=100000 — AB

## 2024-08-03 ENCOUNTER — Telehealth (HOSPITAL_BASED_OUTPATIENT_CLINIC_OR_DEPARTMENT_OTHER): Payer: Self-pay | Admitting: *Deleted

## 2024-08-03 NOTE — Progress Notes (Addendum)
 ED Antimicrobial Stewardship Positive Culture Follow Up   Amanda [name] is an 83 y.o. female who presented to [facility] on 07/30/2024 with a chief complaint of  Chief Complaint  Patient presents with   Eye Pain    right   Recent Results (from the past 720 hours)  Resp panel by RT-PCR (RSV, Flu A&B, Covid) Anterior Nasal Swab     Status: None   Collection Time: 07/30/24  3:34 PM   Specimen: Anterior Nasal Swab  Result Value Ref Range Status   SARS Coronavirus 2 by RT PCR NEGATIVE NEGATIVE Final    Comment: (NOTE) SARS-CoV-2 target nucleic acids are NOT DETECTED.  The SARS-CoV-2 RNA is generally detectable in upper respiratory specimens during the acute phase of infection. The lowest concentration of SARS-CoV-2 viral copies this assay can detect is 138 copies/mL. A negative result does not preclude SARS-Cov-2 infection and should not be used as the sole basis for treatment or other patient management decisions. A negative result may occur with  improper specimen collection/handling, submission of specimen other than nasopharyngeal swab, presence of viral mutation(s) within the areas targeted by this assay, and inadequate number of viral copies(<138 copies/mL). A negative result must be combined with clinical observations, patient history, and epidemiological information. The expected result is Negative.  Fact Sheet for Patients:  bloggercourse.com  Fact Sheet for Healthcare Providers:  seriousbroker.it  This test is no t yet approved or cleared by the [place]  FDA and  has been authorized for detection and/or diagnosis of SARS-CoV-2 by FDA under an Emergency Use Authorization (EUA). This EUA will remain  in effect (meaning this test can be used) for the duration of the COVID-19 declaration under Section 564(b)(1) of the Act, 21 U.S.C.section 360bbb-3(b)(1), unless the authorization is terminated  or revoked sooner.        Influenza A by PCR NEGATIVE NEGATIVE Final   Influenza B by PCR NEGATIVE NEGATIVE Final    Comment: (NOTE) The Xpert Xpress SARS-CoV-2/FLU/RSV plus assay is intended as an aid in the diagnosis of influenza from Nasopharyngeal swab specimens and should not be used as a sole basis for treatment. Nasal washings and aspirates are unacceptable for Xpert Xpress SARS-CoV-2/FLU/RSV testing.  Fact Sheet for Patients: bloggercourse.com  Fact Sheet for Healthcare Providers: seriousbroker.it  This test is not yet approved or cleared by the [place]  FDA and has been authorized for detection and/or diagnosis of SARS-CoV-2 by FDA under an Emergency Use Authorization (EUA). This EUA will remain in effect (meaning this test can be used) for the duration of the COVID-19 declaration under Section 564(b)(1) of the Act, 21 U.S.C. section 360bbb-3(b)(1), unless the authorization is terminated or revoked.     Resp Syncytial Virus by PCR NEGATIVE NEGATIVE Final    Comment: (NOTE) Fact Sheet for Patients: bloggercourse.com  Fact Sheet for Healthcare Providers: seriousbroker.it  This test is not yet approved or cleared by the [place]  FDA and has been authorized for detection and/or diagnosis of SARS-CoV-2 by FDA under an Emergency Use Authorization (EUA). This EUA will remain in effect (meaning this test can be used) for the duration of the COVID-19 declaration under Section 564(b)(1) of the Act, 21 U.S.C. section 360bbb-3(b)(1), unless the authorization is terminated or revoked.  Performed at [facility], [address] [place], [place] [address]   Urine Culture     Status: Abnormal   Collection Time: 07/30/24  7:53 PM   Specimen: Urine, Bag (ped)  Result Value Ref Range Status  Specimen Description   Final    URINE, BAG PED Performed at [facility], [address],  [place], [place] [address]    Special Requests   Final    NONE Performed at [facility], [address], [place], [place] [address]    Culture >=100,000 COLONIES/mL ESCHERICHIA COLI (A)  Final   Report Status 08/02/2024 FINAL  Final   Organism ID, Bacteria ESCHERICHIA COLI (A)  Final      Susceptibility   Escherichia coli - MIC*    AMPICILLIN 16 INTERMEDIATE Intermediate     CEFAZOLIN (URINE) Value in next row Sensitive      4 SENSITIVEThis is a modified FDA-approved test that has been validated and its performance characteristics determined by the reporting laboratory.  This laboratory is certified under the Clinical Laboratory Improvement Amendments CLIA as qualified to perform high complexity clinical laboratory testing.    CEFEPIME Value in next row Sensitive      4 SENSITIVEThis is a modified FDA-approved test that has been validated and its performance characteristics determined by the reporting laboratory.  This laboratory is certified under the Clinical Laboratory Improvement Amendments CLIA as qualified to perform high complexity clinical laboratory testing.    ERTAPENEM Value in next row Sensitive      4 SENSITIVEThis is a modified FDA-approved test that has been validated and its performance characteristics determined by the reporting laboratory.  This laboratory is certified under the Clinical Laboratory Improvement Amendments CLIA as qualified to perform high complexity clinical laboratory testing.    CEFTRIAXONE Value in next row Sensitive      4 SENSITIVEThis is a modified FDA-approved test that has been validated and its performance characteristics determined by the reporting laboratory.  This laboratory is certified under the Clinical Laboratory Improvement Amendments CLIA as qualified to perform high complexity clinical laboratory testing.    CIPROFLOXACIN Value in next row Sensitive      4 SENSITIVEThis is a modified FDA-approved test that has been validated and its performance  characteristics determined by the reporting laboratory.  This laboratory is certified under the Clinical Laboratory Improvement Amendments CLIA as qualified to perform high complexity clinical laboratory testing.    GENTAMICIN Value in next row Sensitive      4 SENSITIVEThis is a modified FDA-approved test that has been validated and its performance characteristics determined by the reporting laboratory.  This laboratory is certified under the Clinical Laboratory Improvement Amendments CLIA as qualified to perform high complexity clinical laboratory testing.    NITROFURANTOIN Value in next row Resistant      4 SENSITIVEThis is a modified FDA-approved test that has been validated and its performance characteristics determined by the reporting laboratory.  This laboratory is certified under the Clinical Laboratory Improvement Amendments CLIA as qualified to perform high complexity clinical laboratory testing.    TRIMETH/SULFA Value in next row Sensitive      4 SENSITIVEThis is a modified FDA-approved test that has been validated and its performance characteristics determined by the reporting laboratory.  This laboratory is certified under the Clinical Laboratory Improvement Amendments CLIA as qualified to perform high complexity clinical laboratory testing.    AMPICILLIN/SULBACTAM Value in next row Sensitive      4 SENSITIVEThis is a modified FDA-approved test that has been validated and its performance characteristics determined by the reporting laboratory.  This laboratory is certified under the Clinical Laboratory Improvement Amendments CLIA as qualified to perform high complexity clinical laboratory testing.    PIP/TAZO Value in next row  Sensitive      <=4 SENSITIVEThis is a modified FDA-approved test that has been validated and its performance characteristics determined by the reporting laboratory.  This laboratory is certified under the Clinical Laboratory Improvement Amendments CLIA as qualified to  perform high complexity clinical laboratory testing.    MEROPENEM Value in next row Sensitive      <=4 SENSITIVEThis is a modified FDA-approved test that has been validated and its performance characteristics determined by the reporting laboratory.  This laboratory is certified under the Clinical Laboratory Improvement Amendments CLIA as qualified to perform high complexity clinical laboratory testing.    * >=100,000 COLONIES/mL ESCHERICHIA COLI   Assessment: Urine culture grew >100k E. coli resistant to nitrofurantoin. However, patient was without urinary symptoms at time of ED presentation. Mental status has returned to baseline.  Additionally, urine sample provided from urostomy bag, making urinalysis and urine culture difficult to interpret.   Plan: No changes  ED Provider: Dr. Tonia Maurilio LITTIE Wilhemena 08/03/2024, 9:15 AM Clinical Pharmacist Monday - Friday phone -  [phone] Saturday - Sunday phone - [phone]

## 2024-08-03 NOTE — Telephone Encounter (Signed)
 Post ED Visit - Positive Culture Follow-up  Culture report reviewed by antimicrobial stewardship pharmacist: [facility] []  Rankin Dee, Pharm.D. []  Venetia Gully, Pharm.D., BCPS AQ-ID []  Garrel Crews, Pharm.D., BCPS []  Almarie Lunger, Pharm.D., BCPS []  [place], [address].D., BCPS, AAHIVP []  Rosaline Bihari, Pharm.D., BCPS, AAHIVP []  Vernell Meier, PharmD, BCPS []  Latanya Hint, PharmD, BCPS []  Donald Medley, PharmD, BCPS []  Rocky Bold, PharmD []  Dorothyann Alert, PharmD, BCPS []  Morene Babe, PharmD  [facility] []  Rosaline Edison, PharmD []  Romona Bliss, PharmD []  Dolphus Roller, PharmD []  Veva Seip, Rph []  Vernell Daunt) Leonce, PharmD []  Eva Allis, PharmD []  Rosaline Millet, PharmD []  Iantha Batch, PharmD []  Arvin Gauss, PharmD []  Wanda Hasting, PharmD []  Ronal Rav, PharmD []  Rocky Slade, PharmD []  Bard Jeans, PharmD   Positive urine culture reviewed by  Sidra Rushing Treated with Doxycycline  Hyclate, organism sensitive to the same and no further patient follow-up is required at this time.  Jama Lisle Blondie 08/03/2024, 12:07 PM

## 2024-08-19 NOTE — ED Provider Notes (Addendum)
 Emergency Department Provider Note    ED Clinical Impression   Final diagnoses:  Acute cystitis without hematuria (Primary)  Altered mental status, unspecified altered mental status type    ED Assessment/Plan    Condition: Serious Disposition: Admit  This chart has been completed using [employer] (consulting) software, and while attempts have been made to ensure accuracy, certain words and phrases may not be transcribed as intended.   History   Chief Complaint  Patient presents with  . Confusion   HPI  Amanda [name] is a 83 y.o. female  who presents today to the  emergency department complaining of hallucinations, confusion and UTI.  Family reports for the last month they have been on antibiotics for UTI.  She was given Macrobid twice, initially she has improvement but then the symptoms of confusion come back.  She reports visual hallucinations and family was says that she is more confused than normal.  She denies nausea, vomiting, fever, chills, chest pain, shortness of breath.  She does have cough and congestion and has recently been on antibiotics for for this.  She was previously at [facility] for the same problem and had a negative workup other than the UTI.    Allergies: is allergic to sulfa (sulfonamide antibiotics), ciprofloxacin, lactose, levofloxacin, sulfamethoxazole, aspirin, cephalexin, clindamycin, hydromorphone, and penicillins. Medications: has a current medication list which includes the following long-term medication(s): albuterol , breztri aerosphere, famotidine, albuterol , alendronate, and ipratropium-albuterol . PMHx:  has a past medical history of Bladder cancer    (CMS-HCC), Bladder cancer    (CMS-HCC), COPD (chronic obstructive pulmonary disease)    (CMS-HCC), Lung abnormality, and Osteoporosis. PSHx:  has a past surgical history that includes Appendectomy; Bladder  surgery; Hysterectomy; VR CHEST TUBE PLACEMENT - PNEUMOTHORAX ([facility]X HISTORICAL RESULT); and Revision urostomy cutaneous. SocHx:  reports that she has quit smoking. Her smoking use included cigarettes. She has never used smokeless tobacco. She reports that she does not drink alcohol and does not use drugs. Allergies, Medications, Medical, Surgical, and Social History were reviewed as documented above.   Social Drivers of Health with Concerns   Food Insecurity: Food Insecurity Present (10/03/2022)   [facility]   Hunger Vital Sign   . Within the past 12 months, you worried that your food would run out before you got the money to buy more.: Sometimes true   . Within the past 12 months, the food you bought just didn't last and you didn't have money to get more.: Sometimes true  Tobacco Use: Medium Risk (07/30/2024)   Received from [facility]   Patient History   . Smoking Tobacco Use: Former   . Smokeless Tobacco Use: Never   . Passive Exposure: Not on file  Transportation Needs: Not on file  Alcohol Use: Not on file  Housing: Not on file  Physical Activity: Not on file  Stress: Not on file  Interpersonal Safety: Not on file  Substance Use: Not on file (08/04/2023)  Intimate Partner Violence: Not on file  Social Connections: Not on file  Financial Resource Strain: Medium Risk (10/03/2022)   Received from [facility]   Overall Financial Resource Strain (CARDIA)   . Difficulty of Paying Living Expenses: Somewhat hard  Health Literacy: Not on file  Internet Connectivity: Not on file  Review Of Systems  Review of Systems  All other systems reviewed and are negative.   Physical Exam   BP 107/63   Pulse 100   Temp 36.6 C (97.8 F) (Temporal)   Resp 24   Ht 165.1 cm (5' 5)   Wt 42.2 kg (93 lb)   SpO2 100%   BMI 15.48 kg/m   Physical Exam Vitals and nursing note reviewed.  Constitutional:      Appearance: Normal appearance.  HENT:     Head: Normocephalic.      Nose: Nose normal.     Mouth/Throat:     Mouth: Mucous membranes are moist.  Eyes:     Extraocular Movements: Extraocular movements intact.     Conjunctiva/sclera: Conjunctivae normal.  Cardiovascular:     Rate and Rhythm: Normal rate and regular rhythm.     Pulses: Normal pulses.     Heart sounds: Normal heart sounds.  Pulmonary:     Effort: Pulmonary effort is normal.     Breath sounds: Normal breath sounds.  Abdominal:     Palpations: Abdomen is soft.     Tenderness: There is no abdominal tenderness. There is no right CVA tenderness or left CVA tenderness.  Musculoskeletal:        General: Normal range of motion.     Cervical back: Normal range of motion and neck supple.  Skin:    General: Skin is warm.     Capillary Refill: Capillary refill takes less than 2 seconds.  Neurological:     General: No focal deficit present.     Mental Status: She is alert.     Cranial Nerves: No cranial nerve deficit.     Sensory: No sensory deficit.     Motor: No weakness.     Comments: No focal neurodeficits, answers questions appropriately  Psychiatric:     Comments: Reported visual hallucinations     ED Course  Medical Decision Making 83 year old female presents with increased confusion, hallucinations and concern for UTI.  She has been on Macrobid twice in the last month for urinary tract infection.  Family reports confusion improved some but then comes back.  She denies dysuria, urgency, frequency, fever, chills, nausea, vomiting, back pain.  Chart review shows that cultures taken from [facility] had her resistant to Macrobid.  She also has multiple allergies to other medications.  Will check labs, UA and reassess  Discussed with pharmacist and they recommend meropenem for her UTI.  Discussed with patient and family about admission for observation and IV antibiotics and fluids versus outpatient follow-up.  Family does not feel comfortable taking her home because she is having  hallucinations and is not out of baseline mental status.  Will admit for further management.  Discussed with hospitalist and they will accept admission.  Amount and/or Complexity of Data Reviewed Labs: ordered.  Risk Decision regarding hospitalization.     Procedures   No results found for this visit on 08/19/24 (from the past 4464 hours).   ED Results Results for orders placed or performed during the hospital encounter of 08/19/24  Comprehensive Metabolic Panel  Result Value Ref Range   Sodium 143 135 - 145 mmol/L   Potassium 3.7 3.5 - 5.0 mmol/L   Chloride 108 (H) 98 - 107 mmol/L   CO2 28.3 21.0 - 32.0 mmol/L   Anion Gap 7 3 - 11 mmol/L   BUN 11 8 - 20 mg/dL   Creatinine 9.01 9.39 - 1.10 mg/dL   BUN/Creatinine  Ratio 11    eGFR CKD-EPI (2021) Female 57 (L) >=60 mL/min/1.87m2   Glucose 115 70 - 179 mg/dL   Calcium 8.3 (L) 8.5 - 10.1 mg/dL   Albumin 2.8 (L) 3.5 - 5.0 g/dL   Total Protein 6.2 6.0 - 8.0 g/dL   Total Bilirubin 0.6 0.3 - 1.2 mg/dL   AST 12 (L) 15 - 40 U/L   ALT 9 (L) 12 - 78 U/L   Alkaline Phosphatase 56 46 - 116 U/L  Lipase  Result Value Ref Range   Lipase 29 16 - 77 U/L  Magnesium   Result Value Ref Range   Magnesium  1.7 1.6 - 2.6 mg/dL  Lactate Sepsis  Result Value Ref Range   Lactate 1.1 0.5 - 1.9 mmol/L  CBC w/ Differential  Result Value Ref Range   WBC 5.1 4.0 - 10.5 10*9/L   RBC 3.36 (L) 3.80 - 5.10 10*12/L   HGB 10.5 (L) 11.5 - 15.0 g/dL   HCT 68.3 (L) 65.9 - 55.9 %   MCV 94.0 80.0 - 98.0 fL   MCH 31.3 27.0 - 34.0 pg   MCHC 33.2 32.0 - 36.0 g/dL   RDW 84.9 (H) 88.4 - 85.4 %   MPV 9.6 7.4 - 10.4 fL   Platelet 212 140 - 415 10*9/L   Neutrophils % 64.6 %   Lymphocytes % 23.9 %   Monocytes % 7.1 %   Eosinophils % 2.4 %   Basophils % 1.8 %   Absolute Neutrophils 3.3 1.8 - 7.8 10*9/L   Absolute Lymphocytes 1.2 0.7 - 4.5 10*9/L   Absolute Monocytes 0.4 0.1 - 1.0 10*9/L   Absolute Eosinophils 0.1 0.0 - 0.4 10*9/L   Absolute Basophils 0.1  0.0 - 0.2 10*9/L  Urinalysis with Microscopy with Culture Reflex  Result Value Ref Range   Color, UA Light Yellow    Clarity, UA Cloudy (A) Clear   Specific Gravity, UA 1.012 1.010 - 1.025   pH, UA 5.5 5.0 - 8.0   Leukocyte Esterase, UA Moderate (A) Negative   Nitrite, UA Negative Negative   Protein, UA Negative Negative   Glucose, UA Negative Negative, Trace   Ketones, UA Negative Negative   Urobilinogen, UA <2.0 mg/dL <7.9 mg/dL   Bilirubin, UA Negative Negative   Blood, UA Small (A) Negative   RBC, UA 5 (H) 0 - 3 /HPF   WBC, UA 23 (H) 0 - 3 /HPF   Squam Epithel, UA <1 0 - 10 /HPF   Bacteria, UA None Seen None Seen /HPF   WBC Clumps None Seen None Seen /HPF   Hyphal Yeast None Seen None Seen /HPF   Yeast, UA None Seen None Seen /HPF   No results found.  Medications Administered:  Medications  sodium chloride  0.9% (NS) bolus 1,000 mL (has no administration in time range)    Followed by  sodium chloride  0.9% (NS) bolus 500 mL (500 mL Intravenous New Bag 08/19/24 1659)  meropenem (MERREM) 1 g in sodium chloride  0.9 % (NS) 100 mL IVPB-connector bag (1 g Intravenous New Bag 08/19/24 1658)    Discharge Medications (Medications Prescribed during this  ED visit and Patient's Home Medications) :    Your Medication List     ASK your doctor about these medications    albuterol  1.25 mg/3 mL nebulizer solution Commonly known as: ACCUNEB  Inhale 3 mL (1.25 mg total) by nebulization every six (6) hours as needed for wheezing.   albuterol  90 mcg/actuation inhaler Commonly known as: PROVENTIL   HFA;VENTOLIN  HFA Inhale 1 puff every four (4) hours as needed for shortness of breath.   alendronate 70 MG tablet Commonly known as: FOSAMAX   ALPRAZolam  0.5 MG tablet Commonly known as: XANAX  Take 1 tablet (0.5 mg total) by mouth daily.   ascorbic acid  (vitamin C ) 250 MG tablet Commonly known as: VITAMIN C  Take 1 tablet (250 mg total) by mouth.   BREZTRI AEROSPHERE 160-9-4.8  mcg/actuation inhaler Generic drug: budesonide-glycopyr-formoterol  Inhale 2 puffs Two (2) times a day.   cholecalciferol (vitamin D3 25 mcg (1,000 units)) 1,000 unit (25 mcg) tablet Take 1 tablet (25 mcg total) by mouth.   famotidine 20 MG tablet Commonly known as: PEPCID Take 1 tablet (20 mg total) by mouth daily as needed.   guaiFENesin  100 mg/5 mL syrup Commonly known as: ROBITUSSIN Take 10 mL (200 mg total) by mouth Three (3) times a day as needed for cough.   HYDROcodone -acetaminophen  5-325 mg per tablet Commonly known as: NORCO Take 1 tablet by mouth two (2) times a day as needed for pain.   ipratropium-albuterol  0.5-2.5 mg/3 mL nebulizer Commonly known as: DUO-NEB Inhale 3 mL by nebulization every four (4) hours as needed (wheezing, shortness of breath).   multivitamin capsule Take 1 capsule by mouth.   ondansetron  4 MG tablet Commonly known as: ZOFRAN  Take 1 tablet (4 mg total) by mouth every eight (8) hours as needed.   predniSONE  20 MG tablet Commonly known as: DELTASONE  3 po daily for 2 days, then 2 po daily for 2 days, then 1 po daily for 2 days          Peri Jackee Victory Mickey., MD 08/19/24 1719    Peri Jackee Victory Mickey., MD 08/19/24 1729

## 2024-08-22 NOTE — Discharge Summary (Signed)
 ------------------------------------------------------------------------------- Attestation signed by Feliz Margart Fallow, DO at 08/24/24 1743 I was the supervising physician during time of service.  Case was discussed with APP and I agree with management as outlined below. -------------------------------------------------------------------------------   DISCHARGE SUMMARY [facility]   Discharge date:   August 22, 2024 Length of stay:    LOS: 1 day    Discharge Service:   [facility] Hospitalists Discharge Attending Physician: Brutus Franco Shank, FNP Discharge to:    To Home Condition at Discharge:  stable Code status:                         Full Code   Hospital Course: 08/19/24 Admitted with hallucinations, UTI for IV antibiotics, IVF.    08/20/24 PT saw the pt.  She is better but still having some periods of confusion and seeing things, talking about a man being in the bathroom.  Will continue treatment.    08/21/24 Cough, sore throat.  CXR shows bilateral pleural effusions.  Confused conversation persists.  Start Doxy, monitor for need for Lasix.  08/22/24 Lasix IV this morning, feeling better, discharge home.    ______________________________________   Admission HPI    Patient admitted on: 08/19/2024  2:10 PM  Patient admitted by: Brutus Franco Shank, FNP    CHIEF COMPLAINT: Confusion, hallucinations   Day of admission HPI:  Amanda [name]  is a 83 y.o. 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  Recent visit at [facility], ER on 11/1 showed urine culture positive for E. coli resistant to Macrobid.  Patient has been taking Macrobid at home.  Lives with her daughter Amanda [name], wears chronic oxygen  at 2 to 3 L for COPD.  [facility] P[facility].  Patient states  she can walk a few steps with a walker.  Uses Xanax  at bedtime to help her sleep.   Significant findings today include mild tachycardia with heart rate up to 104, hemoglobin 10.5, white blood count 5.1, lactate 1.1, urinalysis mildly abnormal.  Will start IV antibiotics as patient has many drug allergies and she is having hallucinations to indicate this is a severe infection.  Admit observation to medical surgical room.   Patient admitted on Home O2? - yes Patient on home anticoagulant? -  no Patient admitted with Chronic home foley catheter? - no Foley catheter placed or replaced by another service prior to admission? - no Central Line Status: NONE   Mental Status on Admission: The patient is Alert and oriented to PERSON The patient is Alert And oriented to TIME The patient is Alert and oriented to LOCATION   Today; pt is sitting up in bed awake and alert.  [name] (daughter) at bedside.  She says she is feeling a little better.  Hasn't had a bowel movement, would like a stool softener.    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   Chronic back pain Continue Norco as needed   Anxiety Continue Xanax  as needed   Anticoagulation; Lovenox  IV fluids; stopped IV antibiotics; Merrem and Doxy; discharge  on Tetracycline   Patient is medically stable and ready for discharge home on oral antibiotics.  Discussed the discharge plan at length with patient and her daughter Amanda [name] at bedside.  Discharge home, they declined home health at this time.   Incidental Findings for outpatient Follow-Up: No significant incidental findings present    ADDITIONAL NON-ACUTE FINDINGS, OBSERVATIONS, FAMILY DISCUSSIONS, ETC. (When present):   General; chronically ill appearing 83 year old female in no acute distress although complaining of back pain Cardiovascular; regular rhythm, rate low 70s Pulmonary; lungs clear but coarse bilaterally, saturating 98% on 2L, normal breathing effort Abdomen; soft, nontender, nondistended, bowel sounds present; urostomy bag in place draining clear yellow urine Extremities; moves all extremities spontaneously, no edema Neuro; alert, oriented x 3,, no focal deficit   DVT Prophylaxis Ordered: SQ Enoxaparin  ______________________________________  Mental Status On day of Discharge:  The patient is Alert and oriented to PERSON The patient is Alert And oriented to TIME The patient is Alert and oriented to LOCATION  CODE STATUS :                    Full Code   An advanced care planning discussion was  had with patient and/or patient's decisions maker (documented separately).  Patient discharged on Home O2? - no Patient discharged on home anticoagulant? -  no  Foley Catheter status: None Central Line Status: NONE  Time Spent on Discharge I spent greater than 30 minutes counseling and coordinating care for the discharge of this patient. The pt and her daughter and I discussed the importance of outpatient follow-up as well as concerning signs and symptoms that would require immediate evaluation by a medical professional. The aforementioned conversation participants understand  and did show insight. I did use teachback to ensure understanding. The above participant/s is aware  that not following the discussed plan, recommendations, and follow up can lead to severe negative effects on the patient's health, up to and including death.  Discharge Medications     Your Medication List     STOP taking these medications    ipratropium-albuterol  0.5-2.5 mg/3 mL nebulizer Commonly known as: DUO-NEB   predniSONE  20 MG tablet Commonly known as: DELTASONE        START taking these medications    docusate sodium  100 MG capsule Commonly known as: COLACE Take 2 capsules (200 mg total) by mouth two (2) times a day.   tetracycline 500 mg Tab Take 500 mg by mouth two (2) times a day for 4 days.       CHANGE how you take these medications    albuterol  90 mcg/actuation inhaler Commonly known as: PROVENTIL  HFA;VENTOLIN  HFA Inhale 1 puff every four (4) hours as needed for shortness of breath. What changed: Another medication with the same name was removed. Continue taking this medication, and follow the directions you see here.       CONTINUE taking these medications    alendronate 70 MG tablet Commonly known as: FOSAMAX   ALPRAZolam  0.5 MG tablet Commonly known as: XANAX  Take 1 tablet (0.5 mg total) by mouth daily.  ascorbic acid  (vitamin C ) 250 MG tablet Commonly known as: VITAMIN C  Take 1 tablet (250 mg total) by mouth.   BREZTRI AEROSPHERE 160-9-4.8 mcg/actuation inhaler Generic drug: budesonide-glycopyr-formoterol  Inhale 2 puffs two (2) times a day.   cholecalciferol (vitamin D3 25 mcg (1,000 units)) 1,000 unit (25 mcg) tablet Take 1 tablet (25 mcg total) by mouth.   famotidine 20 MG tablet Commonly known as: PEPCID Take 1 tablet (20 mg total) by mouth daily as needed.   guaiFENesin  100 mg/5 mL syrup Commonly known as: ROBITUSSIN Take 10 mL (200 mg total) by mouth Three (3) times a day as needed for cough.   HYDROcodone -acetaminophen  5-325 mg per tablet Commonly known as: NORCO Take 1 tablet by mouth two (2) times a day as needed for  pain.   multivitamin capsule Take 1 capsule by mouth.   ondansetron  4 MG tablet Commonly known as: ZOFRAN  Take 1 tablet (4 mg total) by mouth every eight (8) hours as needed.       _____________________________________  Nutrition:                                  Diet Instructions     Discharge diet (specify)     Discharge Nutrition Therapy: Regular                     ___________________________________________  Discharge Instructions   Nutrition:                                  Diet Instructions     Discharge diet (specify)     Discharge Nutrition Therapy: Regular       Activity:                                   Activity Instructions     Activity as tolerated         Appointments:                          Follow Up:                              Follow Up instructions and Outpatient Referrals    Call MD for:  persistent nausea or vomiting     Call MD for:  severe uncontrolled pain     Call MD for: Temperature > 38.5 Celsius ( > 101.3 Fahrenheit)     Discharge instructions         Allergies  Allergen Reactions  . Sulfa (Sulfonamide Antibiotics) Shortness Of Breath    Other reaction(s): Other (See Comments)  Dizziness  . Ciprofloxacin Other (See Comments)    GI pain, headache. Pt states she had while she was in hospital (IV) and was ok  GI pain, headache. Pt states she had while she was in hospital (IV) and was ok    . Lactose Other (See Comments)    Intolerant per patient  . Levofloxacin     Hallucinations  . Sulfamethoxazole Other (See Comments)  . Aspirin Nausea And Vomiting  . Cephalexin Rash  . Clindamycin Rash  . Hydromorphone Other (See Comments) and Rash    Rash on back, redness  . Penicillins  Rash     Past Medical History[1]  Past Surgical History[2]   Family History[3]   Current Medications[4]  Imaging  XR Chest 2 views Result Date: 08/21/2024 Exam:  Chest Two Views  History:  Cough  Technique:  2 views  Comparison:   12/29/2022 and prior.  Findings:   Are mildly hyperinflated, consistent with COPD. No focal consolidation or pulmonary edema.  Small bilateral pleural effusions, left greater than right. No pneumothorax.  Unremarkable cardiomediastinal silhouette.       Aortic arch calcifications.     -- Small bilateral pleural effusions, left greater than right.  Signed (Electronic Signature): 08/21/2024 2:25 PM Signed By: Alyce Bellman, MD   Lab Results   Recent Labs    08/22/24 0532  WBC 3.9*  HGB 10.2*  HCT 32.2*  PLT 182   Recent Labs    08/19/24 1502 08/20/24 0919 08/22/24 0532  NA 143 146* 149*  K 3.7 3.7 3.7  CL 108* 113* 112*  CO2 28.3 26.7 28.8  BUN 11 9 8   CREATININE 0.98 0.72 0.80  GLU 115 71 74  CALCIUM 8.3* 8.0* 7.9*  ALBUMIN 2.8* 2.4*  --   PROT 6.2 5.3*  --   BILITOT 0.6 0.5  --   AST 12* 14*  --   ALT 9* 7*  --   ALKPHOS 56 45*  --   MG 1.7  --  1.6  LIPASE 29  --   --   LACTATE 1.1  --   --    No results for input(s): CKTOTAL, CKMB, PCTCKMB, TROPONINI, EDTPNI, BNP, INR, LABPROT, APTT, DDIMER in the last 72 hours. Recent Labs    08/19/24 1502  WBCUA 23*  NITRITE Negative  LEUKOCYTESUR Moderate*  BACTERIA None Seen  RBCUA 5*  BLOODU Small*  GLUCOSEU Negative  PROTEINUA Negative  KETONESU Negative   No results for input(s): OPIAU, BENZU, TRICYCLIC, PCPU, AMPHU, COCAU, CANNAU, BARBU, ETOH, ACETAMIN, SALICYLATE in the last 72 hours. No results for input(s): PREGTESTUR, PREGPOC in the last 72 hours. No results for input(s): OCCULTBLD, RAPSCRN, CDIFRPCR, CDIFFNAP1, A1C, CHOL, LDL, HDL, TRIG in the last 72 hours. No results for input(s): O2SOUR, FIO2ART, PHART, PCO2ART, PO2ART, HCO3ART, O2SATART, BEART in the last 72 hours. Pending Labs     Order Current Status   Blood Culture Preliminary result   Blood Culture Preliminary result       Home Medications   Prior to Admission  medications  Medication Dose, Route, Frequency  albuterol  HFA 90 mcg/actuation inhaler 1 puff, Every 4 hours PRN  ALPRAZolam  (XANAX ) 0.5 MG tablet Take 1 tablet (0.5 mg total) by mouth daily.  famotidine (PEPCID) 20 MG tablet 20 mg, Daily PRN  HYDROcodone -acetaminophen  (NORCO) 5-325 mg per tablet 1 tablet, 2 times a day PRN  ondansetron  (ZOFRAN ) 4 MG tablet 4 mg, Every 8 hours PRN  albuterol  (ACCUNEB ) 1.25 mg/3 mL nebulizer solution 1 ampule, Nebulization, Every 6 hours PRN  alendronate (FOSAMAX) 70 MG tablet No dose, route, or frequency recorded.  ascorbic acid , vitamin C , (VITAMIN C ) 250 MG tablet 250 mg, Oral  budesonide-glycopyr-formoterol  (BREZTRI AEROSPHERE) 160-9-4.8 mcg/actuation inhaler 2 puffs, Inhalation, 2 times a day (standard)  cholecalciferol, vitamin D3, 1,000 unit tablet 1,000 Units, Oral  docusate sodium  (COLACE) 100 MG capsule 200 mg, Oral, 2 times a day (standard)  guaiFENesin  (ROBITUSSIN) 100 mg/5 mL syrup 200 mg, Oral, 3 times a day PRN  ipratropium-albuteroL  (DUO-NEB) 0.5-2.5 mg/3 mL nebulizer 3 mL, Nebulization, Every 4 hours PRN  multivitamin  capsule 1 capsule, Oral  predniSONE  (DELTASONE ) 20 MG tablet 3 po daily for 2 days, then 2 po daily for 2 days, then 1 po daily for 2 days  tetracycline 500 mg Tab 500 mg, Oral, 2 times a day (standard)   Brutus FORBES Shank, FNP Hospitalist, [facility] 08/22/24, 2:23 PM       [1] Past Medical History: Diagnosis Date  . Bladder cancer    (CMS-HCC)   . Bladder cancer    (CMS-HCC)   . COPD (chronic obstructive pulmonary disease) (CMS-HCC)   . Lung abnormality    lung collasped X 2  . Osteoporosis   [2] Past Surgical History: Procedure Laterality Date  . APPENDECTOMY    . BLADDER SURGERY    . HYSTERECTOMY    . REVISION UROSTOMY CUTANEOUS    . VR CHEST TUBE PLACEMENT - PNEUMOTHORAX ([facility] HISTORICAL RESULT)    [3] Family History Problem Relation Age of Onset  . No Known Problems Mother   . No Known Problems Father    [4]  Current Facility-Administered Medications:  .  acetaminophen  (TYLENOL ) tablet 650 mg, 650 mg, Oral, Q4H PRN, Pace, Hagan Eggleston, FNP, 650 mg at 08/22/24 1328 .  albuterol  2.5 mg /3 mL (0.083 %) nebulizer solution 2.5 mg, 2.5 mg, Nebulization, Q4H PRN, Pace, Hagan Eggleston, FNP, 2.5 mg at 08/22/24 0510 .  ALPRAZolam  (XANAX ) tablet 0.5 mg, 0.5 mg, Oral, TID PRN, Pace, Hagan Eggleston, FNP, 0.5 mg at 08/21/24 0115 .  calcium carbonate (TUMS) chewable tablet 400 mg elem calcium, 400 mg elem calcium, Oral, Daily PRN, Pace, Hagan Eggleston, FNP, 400 mg elem calcium at 08/20/24 2033 .  docusate sodium  (COLACE) capsule 200 mg, 200 mg, Oral, BID, Pace, Hagan Eggleston, FNP, 200 mg at 08/22/24 1031 .  doxycycline  (VIBRAMYCIN ) 100 mg in sodium chloride  0.9 % (NS) 100 mL IVPB-connector bag, 100 mg, Intravenous, Q12H, Pace, Brutus Bryant, FNP, Stopped at 08/22/24 1141 .  famotidine (PEPCID) tablet 20 mg, 20 mg, Oral, Daily, Pace, Hagan Eggleston, FNP, 20 mg at 08/22/24 9095 .  fluticasone furoate-vilanterol (BREO ELLIPTA) 200-25 mcg/dose inhaler 1 puff, 1 puff, Inhalation, Daily (RT), 1 puff at 08/22/24 0852 **AND** umeclidinium (INCRUSE ELLIPTA) 62.5 mcg/actuation inhaler 1 puff, 1 puff, Inhalation, Daily (RT), Pace, Hagan Eggleston, FNP .  guaiFENesin  (ROBITUSSIN) oral syrup, 200 mg, Oral, Q4H PRN, Pace, Hagan Eggleston, FNP, 200 mg at 08/22/24 1031 .  HYDROcodone -acetaminophen  (NORCO) 5-325 mg per tablet 1 tablet, 1 tablet, Oral, BID PRN, Pace, Hagan Eggleston, FNP .  ketorolac (TORADOL) injection 15 mg, 15 mg, Intravenous, Q6H PRN, Pace, Hagan Eggleston, FNP .  melatonin tablet 3 mg, 3 mg, Oral, Nightly PRN, Pace, Hagan Eggleston, FNP .  meropenem (MERREM) 1 g in sodium chloride  0.9 % (NS) 100 mL IVPB-connector bag, 1 g, Intravenous, Q12H, Pace, Brutus Bryant, FNP, Stopped at 08/22/24 [phone] .  nicotine (NICODERM CQ) 21 mg/24 hr patch 1 patch, 1 patch, Transdermal, Daily PRN, Pace, Hagan  Eggleston, FNP .  ondansetron  (ZOFRAN ) injection 4 mg, 4 mg, Intravenous, Q8H PRN **OR** ondansetron  (ZOFRAN ) injection 8 mg, 8 mg, Intravenous, Q8H PRN, Pace, Hagan Eggleston, FNP

## 2024-09-01 ENCOUNTER — Emergency Department (HOSPITAL_COMMUNITY)

## 2024-09-01 ENCOUNTER — Other Ambulatory Visit: Payer: Self-pay

## 2024-09-01 ENCOUNTER — Encounter (HOSPITAL_COMMUNITY): Payer: Self-pay

## 2024-09-01 ENCOUNTER — Emergency Department (HOSPITAL_COMMUNITY)
Admission: EM | Admit: 2024-09-01 | Discharge: 2024-09-01 | Disposition: A | Discharge status: Home / Self Care | Attending: Emergency Medicine | Admitting: Emergency Medicine

## 2024-09-01 DIAGNOSIS — R0981 Nasal congestion: Secondary | ICD-10-CM | POA: Insufficient documentation

## 2024-09-01 DIAGNOSIS — R059 Cough, unspecified: Secondary | ICD-10-CM | POA: Insufficient documentation

## 2024-09-01 DIAGNOSIS — R0602 Shortness of breath: Secondary | ICD-10-CM | POA: Insufficient documentation

## 2024-09-01 DIAGNOSIS — Z7951 Long term (current) use of inhaled steroids: Secondary | ICD-10-CM | POA: Insufficient documentation

## 2024-09-01 DIAGNOSIS — N3000 Acute cystitis without hematuria: Secondary | ICD-10-CM | POA: Insufficient documentation

## 2024-09-01 DIAGNOSIS — J449 Chronic obstructive pulmonary disease, unspecified: Secondary | ICD-10-CM | POA: Insufficient documentation

## 2024-09-01 DIAGNOSIS — R443 Hallucinations, unspecified: Secondary | ICD-10-CM | POA: Insufficient documentation

## 2024-09-01 DIAGNOSIS — R4182 Altered mental status, unspecified: Secondary | ICD-10-CM | POA: Insufficient documentation

## 2024-09-01 LAB — CBC WITH DIFFERENTIAL/PLATELET
Abs Immature Granulocytes: 0.02 K/uL (ref 0.00–0.07)
Basophils Absolute: 0.1 K/uL (ref 0.0–0.1)
Basophils Relative: 1 %
Eosinophils Absolute: 0.2 K/uL (ref 0.0–0.5)
Eosinophils Relative: 4 %
HCT: 33.8 % — ABNORMAL LOW (ref 36.0–46.0)
Hemoglobin: 10.9 g/dL — ABNORMAL LOW (ref 12.0–15.0)
Immature Granulocytes: 0 %
Lymphocytes Relative: 17 %
Lymphs Abs: 0.9 K/uL (ref 0.7–4.0)
MCH: 31.9 pg (ref 26.0–34.0)
MCHC: 32.2 g/dL (ref 30.0–36.0)
MCV: 98.8 fL (ref 80.0–100.0)
Monocytes Absolute: 0.4 K/uL (ref 0.1–1.0)
Monocytes Relative: 7 %
Neutro Abs: 3.6 K/uL (ref 1.7–7.7)
Neutrophils Relative %: 71 %
Platelets: 189 K/uL (ref 150–400)
RBC: 3.42 MIL/uL — ABNORMAL LOW (ref 3.87–5.11)
RDW: 15.9 % — ABNORMAL HIGH (ref 11.5–15.5)
WBC: 5.1 K/uL (ref 4.0–10.5)
nRBC: 0 % (ref 0.0–0.2)

## 2024-09-01 LAB — COMPREHENSIVE METABOLIC PANEL WITH GFR
ALT: 6 U/L (ref 0–44)
AST: 18 U/L (ref 15–41)
Albumin: 3.6 g/dL (ref 3.5–5.0)
Alkaline Phosphatase: 81 U/L (ref 38–126)
Anion gap: 8 (ref 5–15)
BUN: 12 mg/dL (ref 8–23)
CO2: 28 mmol/L (ref 22–32)
Calcium: 8.6 mg/dL — ABNORMAL LOW (ref 8.9–10.3)
Chloride: 106 mmol/L (ref 98–111)
Creatinine, Ser: 0.75 mg/dL (ref 0.44–1.00)
GFR, Estimated: >60 mL/min (ref >60)
Glucose, Bld: 92 mg/dL (ref 70–99)
Potassium: 3.9 mmol/L (ref 3.5–5.1)
Sodium: 142 mmol/L (ref 135–145)
Total Bilirubin: 0.4 mg/dL (ref 0.0–1.2)
Total Protein: 6.2 g/dL — ABNORMAL LOW (ref 6.5–8.1)

## 2024-09-01 LAB — URINALYSIS, W/ REFLEX TO CULTURE (INFECTION SUSPECTED)
Bilirubin Urine: NEGATIVE
Glucose, UA: NEGATIVE mg/dL
Ketones, ur: NEGATIVE mg/dL
Nitrite: POSITIVE — AB
Protein, ur: NEGATIVE mg/dL
Specific Gravity, Urine: 1.013 (ref 1.005–1.030)
pH: 5 (ref 5.0–8.0)

## 2024-09-01 LAB — LACTIC ACID, PLASMA
Lactic Acid, Venous: 1.4 mmol/L (ref 0.5–1.9)
Lactic Acid, Venous: 1.4 mmol/L (ref 0.5–1.9)

## 2024-09-01 MED ORDER — FOSFOMYCIN TROMETHAMINE 3 G PO PACK: 3.0000 g | PACK | Freq: Once | ORAL | 0 refills | Status: AC

## 2024-09-01 MED ORDER — IOHEXOL 350 MG/ML SOLN
75.0000 mL | Freq: Once | INTRAVENOUS | Status: AC | PRN
  Administered 2024-09-01: 75 mL via INTRAVENOUS

## 2024-09-01 MED ORDER — IPRATROPIUM-ALBUTEROL 0.5-2.5 (3) MG/3ML IN SOLN
3.0000 mL | Freq: Once | RESPIRATORY_TRACT | Status: AC
  Administered 2024-09-01: 3 mL via RESPIRATORY_TRACT
  Filled 2024-09-01: qty 3

## 2024-09-01 MED ORDER — FOSFOMYCIN TROMETHAMINE 3 G PO PACK
3.0000 g | PACK | Freq: Once | ORAL | Status: AC
  Administered 2024-09-01: 3 g via ORAL
  Filled 2024-09-01: qty 3

## 2024-09-01 NOTE — ED Provider Notes (Signed)
 [facility] Provider Note   CSN: [id number] Arrival date & time: 09/01/24  1334     Patient presents with: Altered Mental Status   Amanda [name] is a 83 y.o. female.  First with confusion this going on for the past month and a half, was felt to be due to UTI.  Patient's daughter states she was treated with Macrobid x 2 without relief.  She did ultimately have urine culture come back positive for E. coli resistant to Macrobid.  She subsequently developed cough and congestion, was treated for possible pneumonia with doxycycline .  She had a urine culture at that visit on 11/1, patient was not having urinary symptoms from that visit and apparently mental status had returned to baseline so they did not add additional antibiotics after culture grown out the E. coli.  Patient ended up presenting to the ED at [facility] on 11/21.  She is having cough and congestion, still having worsening confusion.  She was treated with 4 days of meropenem, was having cough and pleural effusions and shortness of breath so was started on doxycycline .  She was discharged home on tetracycline.   Patient's daughter states she is still having some intermittent hallucinations, has been having more shortness of breath and decreased oxygen  saturations on ambulation.  Wants further evaluation.    Altered Mental Status      Prior to Admission medications   Medication Sig Start Date End Date Taking? Authorizing Provider  fosfomycin (MONUROL ) 3 g PACK Take 3 g by mouth once for 1 dose. 09/04/24 09/04/24 Yes Teiana Hajduk A, PA-C  acetaminophen  (TYLENOL ) 500 MG tablet Take 500-1,000 mg by mouth daily as needed for mild pain or moderate pain.     [provider]  albuterol  (PROVENTIL  HFA;VENTOLIN  HFA) 108 (90 Base) MCG/ACT inhaler Inhale 1-2 puffs into the lungs every 6 (six) hours as needed for wheezing or shortness of breath. 09/29/18   Towana Ozell BROCKS, MD   ALPRAZolam  (XANAX ) 0.5 MG tablet Take 0.25 mg by mouth daily as needed for anxiety. 12/24/19   [provider]  ascorbic acid  (GNP VITAMIN C ) 500 MG tablet Take 500 mg by mouth daily.    [provider]  Ascorbic Acid  (VITAMIN C ) 500 MG CAPS Take 500 mg by mouth daily.     [provider]  benzonatate  (TESSALON ) 100 MG capsule Take 1 capsule (100 mg total) by mouth 3 (three) times daily as needed for cough. 07/30/24   Hinnant, Collin F, PA-C  Cholecalciferol (VITAMIN D -3) 125 MCG (5000 UT) TABS Take 5,000 Units by mouth daily.    [provider]  docusate sodium  (COLACE) 100 MG capsule Take 1 capsule (100 mg total) by mouth 2 (two) times daily. Patient taking differently: Take 100 mg by mouth daily.  10/19/19   Vann, Jessica U, DO  guaiFENesin  (MUCINEX ) 600 MG 12 hr tablet Take 600 mg by mouth daily.    [provider]  ipratropium-albuterol  (DUONEB) 0.5-2.5 (3) MG/3ML SOLN Take 3 mLs by nebulization every 6 (six) hours as needed for wheezing or shortness of breath. 01/03/20   [provider]  Multiple Vitamin (MULTIVITAMIN WITH MINERALS) TABS tablet Take 1 tablet by mouth daily. Centrum Multivitamin A to Zinc    [provider]  predniSONE  (DELTASONE ) 20 MG tablet 2 tabs po daily x 4 days 07/05/23   Freddi Hamilton, MD  [facility] 80-4.5 MCG/ACT inhaler Inhale 2 puffs into the lungs 2 (two) times  daily. 09/05/19   [provider]    Allergies: Misc. sulfonamide containing compounds, Sulfa antibiotics, Ciprofloxacin, Lactose, Levofloxacin, Sulfamethoxazole, Aspirin, Cephalexin, Clindamycin, Erythromycin base, Hydromorphone, and Penicillins    Review of Systems  Updated Vital Signs BP 132/68   Pulse 90   Temp 98.6 F (37 C)   Resp 18   Ht 5' 5 (1.651 m)   Wt 44.5 kg   SpO2 95%   BMI 16.33 kg/m   Physical Exam Vitals and nursing note reviewed.  Constitutional:      General: She is not in acute distress.    Appearance:  She is well-developed.  HENT:     Head: Normocephalic and atraumatic.     Mouth/Throat:     Mouth: Mucous membranes are moist.  Eyes:     Extraocular Movements: Extraocular movements intact.     Conjunctiva/sclera: Conjunctivae normal.     Pupils: Pupils are equal, round, and reactive to light.  Cardiovascular:     Rate and Rhythm: Normal rate and regular rhythm.     Heart sounds: No murmur heard. Pulmonary:     Effort: Pulmonary effort is normal. No respiratory distress.     Breath sounds: Normal breath sounds.  Abdominal:     Palpations: Abdomen is soft.     Tenderness: There is no abdominal tenderness.     Comments: Ostomy site noted in lower abdomen is clean and intact with clear urine draining  Musculoskeletal:        General: No swelling or tenderness.     Cervical back: Normal range of motion and neck supple.     Right lower leg: No edema.     Left lower leg: No edema.  Skin:    General: Skin is warm and dry.     Capillary Refill: Capillary refill takes less than 2 seconds.  Neurological:     General: No focal deficit present.     Mental Status: She is alert and oriented to person, place, and time.  Psychiatric:        Mood and Affect: Mood normal.     (all labs ordered are listed, but only abnormal results are displayed) Labs Reviewed  COMPREHENSIVE METABOLIC PANEL WITH GFR - Abnormal; Notable for the following components:      Result Value   Calcium 8.6 (*)    Total Protein 6.2 (*)    All other components within normal limits  CBC WITH DIFFERENTIAL/PLATELET - Abnormal; Notable for the following components:   RBC 3.42 (*)    Hemoglobin 10.9 (*)    HCT 33.8 (*)    RDW 15.9 (*)    All other components within normal limits  URINALYSIS, W/ REFLEX TO CULTURE (INFECTION SUSPECTED) - Abnormal; Notable for the following components:   Hgb urine dipstick MODERATE (*)    Nitrite POSITIVE (*)    Leukocytes,Ua MODERATE (*)    Bacteria, UA RARE (*)    All other  components within normal limits  CULTURE, BLOOD (SINGLE)  URINE CULTURE  LACTIC ACID, PLASMA  LACTIC ACID, PLASMA    EKG: None  Radiology: CT Angio Chest PE W and/or Wo Contrast Result Date: 09/01/2024 CLINICAL DATA:  High probability for PE. EXAM: CT ANGIOGRAPHY CHEST WITH CONTRAST TECHNIQUE: Multidetector CT imaging of the chest was performed using the standard protocol during bolus administration of intravenous contrast. Multiplanar CT image reconstructions and MIPs were obtained to evaluate the vascular anatomy. RADIATION DOSE REDUCTION: This exam was performed according to the departmental dose-optimization program  which includes automated exposure control, adjustment of the mA and/or kV according to patient size and/or use of iterative reconstruction technique. CONTRAST:  75mL OMNIPAQUE  IOHEXOL  350 MG/ML SOLN COMPARISON:  Chest CT 06/22/2018.  Chest x-ray 07/05/2023. FINDINGS: Cardiovascular: Satisfactory opacification of the pulmonary arteries to the segmental level. No evidence of pulmonary embolism. Normal heart size. No pericardial effusion. Loops there are atherosclerotic calcifications of the aorta. Mediastinum/Nodes: There is an enlarged subcarinal lymph node measuring 12 mm. Visualized esophagus and thyroid gland are within normal limits. Lungs/Pleura: Severe emphysema is again seen. Bullae in the left lower lobe have increased. There is dependent atelectasis in the bilateral lower lobes. There is pleural-parenchymal scarring in both lung apices which is increased on the right. There is plugging of peripheral bilateral lower lobe bronchi. Trachea and central airways are patent. There is no pleural effusion or pneumothorax. Upper Abdomen: No acute abnormality. Musculoskeletal: The bones are diffusely osteopenic. There is trace compression deformity of T4. There is moderate severe compression deformity of T6, T7 and T8. These are new from 2019. Review of the MIP images confirms the above  findings. IMPRESSION: 1. No evidence for pulmonary embolism. 2. Severe emphysema. 3. Bilateral lower lobe atelectasis with plugging of peripheral bronchi. 4. Increased pleural-parenchymal scarring in the right lung apex. 5. Osteopenia with multiple compression deformities in the thoracic spine, new from 2019. Correlate clinically for point tenderness. Aortic Atherosclerosis (ICD10-I70.0) and Emphysema (ICD10-J43.9). Electronically Signed   By: Greig Pique M.D.   On: 09/01/2024 19:40   DG Chest 2 View Result Date: 09/01/2024 EXAM: 2 VIEW(S) XRAY OF THE CHEST 09/01/2024 02:00:00 PM COMPARISON: 07/30/2024 CLINICAL HISTORY: SOB (shortness of breath) [id number] FINDINGS: LUNGS AND PLEURA: Chronic interstitial coarsening consistent with chronic interstitial lung disease, emphysema, and biapical pleuroparenchymal scarring. Upper zone pulmonary vascular prominence, cannot exclude pulmonary venous hypertension. Trace blunting of the left lateral costophrenic angle. No pneumothorax. HEART AND MEDIASTINUM: Aortic atherosclerosis. No acute abnormality of the cardiac silhouette. BONES AND SOFT TISSUES: Unchanged chronic thoracic compression fractures. IMPRESSION: 1. Chronic interstitial coarsening consistent with chronic interstitial lung disease, emphysema, and biapical pleuroparenchymal scarring. 2. Upper zone pulmonary vascular prominence, pulmonary venous hypertension cannot be excluded. 3. Aortic atherosclerosis. 4. Unchanged chronic thoracic compression fractures. 5. Equivocal small left pleural effusion. Electronically signed by: Ryan Salvage MD 09/01/2024 03:08 PM EST RP Workstation: [id number]     Procedures   Medications Ordered in the ED  fosfomycin (MONUROL ) packet 3 g (3 g Oral Given 09/01/24 1747)  ipratropium-albuterol  (DUONEB) 0.5-2.5 (3) MG/3ML nebulizer solution 3 mL (3 mLs Nebulization Given 09/01/24 2006)  iohexol  (OMNIPAQUE ) 350 MG/ML injection 75 mL (75 mLs Intravenous Contrast Given 09/01/24  1922)                                    Medical Decision Making This patient presents to the ED for concern of hallucinations, sob, this involves an extensive number of treatment options, and is a complaint that carries with it a high risk of complications and morbidity.  The differential diagnosis includes UTI, dementia, but with encephalopathy, COPD exacerbation, electrolyte disturbance, acute anemia or other   Co morbidities that complicate the patient evaluation  Longstanding urostomy, COPD with chronic hypoxic respiratory failure   Additional history obtained:  Additional history obtained from EMR External records from outside source obtained and reviewed including prior notes, recent head CT showing sinusitis, prior labs and urine cultures  Imaging Studies ordered:  I ordered imaging studies including chest x-ray I independently visualized and interpreted imaging which showed chronic interstitial lung disease, emphysema, upper zone pulmonary vascular prominence CT angio of the chest showed no PE, severe emphysema, bilateral lower lobe atelectasis with plugging of peripheral bronchi.  Multiple chronic compression deformities in thoracic spine I agree with the radiologist interpretation     Problem List / ED Course / Critical interventions / Medication management  1-1/2 months of hallucinations, felt to be possibly due to UTI.  Patient also having shortness of breath on ambulation.  On baseline 3 L of oxygen . Currently hallucinations, patient's daughter feels this is due to E. coli UTI, she had recent admission to [facility] for several days and was on meropenem for several days but discharged on tetracycline, patient states she still has some burning at her urostomy site and feels like she has a UTI.  She has no abdominal pain, no nausea or vomiting, no fevers or chills, no CVA tenderness, and she does not have leukocytosis.  BUN and creatinine are at baseline.  Lactic  acid is normal.  UA does show positive nitrate, moderate leukocytes, 21-50 white blood cells per high-power field and rare bacteria.  Prior urine culture from 07/30/2024 is available and shows E. coli UTI with resistance to nitrofurantoin, unfortunately patient does have numerous allergies including to sulfa antibiotics, cephalosporins, penicillins, and quinolones.  I discussed the culture results with pharmacy and they feel that fosfomycin would be adequate to cover this patient's UTI.  Patient given a dose of fosfomycin here.  We ambulated her and saturations did not drop below 95% but she did subjectively feel short of breath, nurse states she got slightly tachypneic as well, because of this and recent hospitalization we did get a PE study that showed severe emphysema but no PE or other acute process.  She was given a breathing treatment.  Oxygen  saturations have remained in the 90s on her baseline 3 L.  Patient's daughter is concerned because the hallucinations have been persistent for the past month and a half and have never gotten any better even when she was on antibiotics.  We discussed where to treat with different antibiotics, at this time there is no indication for admission.  Especially since she reports that the hallucinations never changed event when in the hospital on the meropenem.  I discussed that this could be concerning for a neurologic process such as frontal-temporal dementia.  I recommended outpatient neurology follow-up would be prudent.  Referral was placed.  Advised on close urology and PCP follow-up as well.  She was given strict return precautions.  Patient's daughter did request admission but we discussed at this time I do not feel the patient meets criteria, there are also risks to admission such as hospital-acquired infections, worsening confusion, deconditioning. I ordered medication including duoneb  for SOB  Reevaluation of the patient after these medicines showed that the  patient improved I have reviewed the patients home medicines and have made adjustments as needed    Test / Admission - Considered:  This time patient does not meet criteria for admission, discussed this with Dr. Zaman who evaluated patient as well.   Amount and/or Complexity of Data Reviewed Independent Historian: caregiver    Details: Patient's daughter Labs: ordered. Decision-making details documented in ED Course. Radiology: ordered.  Risk Prescription drug management.        Final diagnoses:  Acute cystitis without hematuria  Hallucinations    ED  Discharge Orders          Ordered    fosfomycin (MONUROL ) 3 g PACK   Once        09/01/24 2137    Ambulatory referral to Neurology       Comments: An appointment is requested in approximately: 1 week   09/01/24 2135               Suellen Sherran DELENA DEVONNA 09/01/24 2151    Suzette Pac, MD 09/02/24 [phone]

## 2024-09-01 NOTE — Discharge Instructions (Signed)
 Seen today for persistent concern for UTI and hallucinations ongoing for the past month and a half.  I reviewed your culture results with the pharmacist today and we feel that meropenem is the best choice to treat your urinary tract infection.  I did send a new culture for further evaluation.  I will give you a prescription for another dose to take in 3 days.  Given that hallucinations have been persistent throughout this course I also think it be prudent for you to follow-up with neurology to ensure there is no other neurologic process.  You did have a normal CT recently of your brain, but I think neurology should evaluate you as well.  Please follow-up closely with your PCP and neurologist also.  Come back to the ER with fever, vomiting, or any other new or worsening symptoms.  Continue using your inhalers at home for your COPD.  The CT scan of your chest did not show any blood clot or pneumonia but did show that you have severe emphysema.

## 2024-09-01 NOTE — Progress Notes (Signed)
 Patient getting breathing treatment at this time, patient is currently on 3L (baseline) with sat of 95%.

## 2024-09-01 NOTE — ED Notes (Signed)
 Pt ambulated approx 12 feet with 3L [place] 02.  pt became anxious and started breathing slightly fast. Back to bed-pt maintained 95% while ambulating but hr up to 104. Comfort measures given and advised to slow breathing down. PA aware.

## 2024-09-01 NOTE — ED Notes (Signed)
 Discharge instructions reviewed.   Newly prescribed medications discussed. Pharmacy verified.   Opportunity for questions and concerns provided.   Alert, oriented and escorted to daughters vehicle via wheelchair.   Displays no signs of distress.   Family discussed concerns about going home. Encouraged to return with worsening or concerning symptoms.

## 2024-09-01 NOTE — ED Triage Notes (Signed)
 Pt arrived via POV from home c/o not feeling well or having energy for quite a while. Pt reports sometimes she sees things, and it's scary. Pt reports sometimes she sees babies that other people cannot see. Pt reports recent hospitalization at [facility] for a UTI and was given antibiotics but does not believe she received enough antibiotics. Pt reports when they cultured her urine, it came back positive for E Coli. Pts daughter in Triage reports the Pt the Pt is on 3L [place] at baseline.

## 2024-09-01 NOTE — ED Notes (Addendum)
 Pt with daughter at bedside. Pt alert/oriented to person and place. Daughter states has been 9 days since finished antibiotics and still having confusion and getting shob with activity and is not her normal.  Pt is on chronic 02 2L at home. Color wnl.

## 2024-09-03 LAB — URINE CULTURE
Culture: >=100000 — AB
Special Requests: NORMAL

## 2024-09-04 ENCOUNTER — Telehealth (HOSPITAL_BASED_OUTPATIENT_CLINIC_OR_DEPARTMENT_OTHER): Payer: Self-pay | Admitting: *Deleted

## 2024-09-04 NOTE — Telephone Encounter (Signed)
 Post ED Visit - Positive Culture Follow-up  Culture report reviewed by antimicrobial stewardship pharmacist: [facility] []  Rankin Dee, Pharm.D. []  Venetia Gully, Pharm.D., BCPS AQ-ID []  Garrel Crews, Pharm.D., BCPS []  Almarie Lunger, Pharm.D., BCPS []  [place], [address].D., BCPS, AAHIVP []  Rosaline Bihari, Pharm.D., BCPS, AAHIVP []  Vernell Meier, PharmD, BCPS []  Latanya Hint, PharmD, BCPS []  Donald Medley, PharmD, BCPS []  Rocky Bold, PharmD []  Dorothyann Alert, PharmD, BCPS [x]  Dorn Buttner, PharmD  [facility] []  Rosaline Edison, PharmD []  Romona Bliss, PharmD []  Dolphus Roller, PharmD []  Veva Seip, Rph []  Vernell Daunt) Leonce, PharmD []  Eva Allis, PharmD []  Rosaline Millet, PharmD []  Iantha Batch, PharmD []  Arvin Gauss, PharmD []  Wanda Hasting, PharmD []  Ronal Rav, PharmD []  Rocky Slade, PharmD []  Bard Jeans, PharmD   Positive urine culture Treated with Fosfomycin, organism sensitive to the same and no further patient follow-up is required at this time.  Albino Alan Novak 09/04/2024, 2:34 PM

## 2024-09-06 LAB — CULTURE, BLOOD (SINGLE): Culture: NO GROWTH

## 2024-10-10 NOTE — Discharge Summary (Addendum)
 " DISCHARGE SUMMARY [facility]   Discharge date:   October 10, 2024 Length of stay:    LOS: 0 days    Discharge Service:   [facility] Hospitalists Discharge Attending Physician: Elspeth Blane Kay, MD Discharge to:    To Home Condition at Discharge:  fair Code status:                         Full Code   Hospital Course: Discharge diagnoses Acute exacerbation of COPD Bilateral pulmonary emboli Bilateral lower extremity DVT Bronchiectasis Influenza Severe chronic protein calorie malnutrition Chronic respiratory failure with hypoxia Incidentally noted nonobstructive subsegmental pulmonary emboli  The patient was admitted to the medical floor and treated with albuterol /ipratropium, levofloxacin  and prednisone .  Symptomatically she seemed to improve.  Repeat CT angiogram was obtained which which showed nonocclusive pulmonary emboli.  Ultrasound of the lower extremities was obtained which showed bilateral DVT.  Risks and benefits of anticoagulation were discussed with the patient and will initiate treatment with Eliquis , advised for minimum of 3 months.  Patient will be discharged home to complete a course of Tamiflu, prednisone  and levofloxacin .  I have advised her to follow-up with a pulmonologist given the bronchiectasis.  I will also refer her for percussion therapy as an outpatient    ______________________________________   Patient admitted on: 10/08/2024 11:03 AM  Patient admitted by: Elspeth Blane Kay, MD    CHIEF COMPLAINT: Shortness of breath   Day of admission HPI:  Amanda [name]  is a 84 y.o. female with a PMH significant for COPD, bronchiectasis, chronic respiratory failure who presented with cough, shortness of breath and increased wheezing.  Been experiencing symptoms for the last 10 days.  He was treated as an outpatient with doxycycline  and prednisone  without much improvement.  She did have a CT scan at [facility] which indicated a possible pulmonary  embolism.  She has not started anticoagulation.  She denies chest pain.  She endorses cough, shortness of breath, wheezing and minimal exercise tolerance.  On evaluation in the emergency room her chest x-ray was stable and she was saturating well on baseline oxygen  requirements.  Presented to the hospitalist as possible failure of outpatient treatment for COPD   Patient admitted on Home O2? - yes Patient on home anticoagulant? -  no Patient admitted with Chronic home foley catheter? - no Foley catheter placed or replaced by another service prior to admission? - yes Central Line Status: NONE   Mental Status on Admission: The patient is Alert and oriented to PERSON The patient is Alert And oriented to TIME The patient is Alert and oriented to LOCATION ______________________________________ Vitals:   10/10/24 0340 10/10/24 0357 10/10/24 0603 10/10/24 0801  BP:  110/71  108/69  Pulse: 101 114  113  Resp: 19 17  18   Temp:  36.2 C (97.2 F)  36.9 C (98.4 F)  TempSrc:  Axillary  Oral  SpO2: 91% 94%  100%  Weight:   42.1 kg (92 lb 13 oz)   Height:        Physical Exam Constitutional:      Appearance: Normal appearance.  Cardiovascular:     Rate and Rhythm: Regular rhythm. Tachycardia present.     Pulses: Normal pulses.     Heart sounds: Normal heart sounds.  Pulmonary:     Effort: Pulmonary effort is normal.     Breath sounds: Examination of the right-middle field reveals wheezing. Examination of the left-middle  field reveals wheezing. Examination of the right-lower field reveals decreased breath sounds. Examination of the left-lower field reveals decreased breath sounds. Decreased breath sounds and wheezing present.  Abdominal:     General: Bowel sounds are normal.     Palpations: Abdomen is soft.  Neurological:     Mental Status: She is alert.     Mental Status On day of Discharge:  The patient is Alert and oriented to PERSON The patient is Alert And oriented to TIME The  patient is Alert and oriented to LOCATION  CODE STATUS :                    Full Code   An advanced care planning discussion was  had with patient and/or patient's decisions maker (documented separately).  Patient discharged on Home O2? - yes Patient discharged on home anticoagulant? -  no  Foley Catheter status: None Central Line Status: NONE  Time Spent on Discharge I spent greater than 30 minutes counseling and coordinating care for the discharge of this patient. Discharge Medications     Your Medication List     STOP taking these medications    alendronate 70 MG tablet Commonly known as: FOSAMAX   fosfomycin  3 gram Pack Commonly known as: MONUROL    ondansetron  4 MG disintegrating tablet Commonly known as: ZOFRAN -ODT   tetracycline 500 MG capsule Commonly known as: SUMYCIN       START taking these medications    apixaban  5 mg Tab Commonly known as: ELIQUIS  Take 2 tablets (10 mg total) by mouth two (2) times a day for 7 days, THEN 1 tablet (5 mg total) two (2) times a day for 21 days. Start taking on: October 10, 2024   ipratropium-albuterol  0.5-2.5 mg/3 mL nebulizer Commonly known as: DUO-NEB Inhale 3 mL by nebulization every six (6) hours as needed.   levoFLOXacin  500 MG tablet Commonly known as: LEVAQUIN  Take 1 tablet (500 mg total) by mouth daily for 4 days.   oseltamivir 30 MG capsule Commonly known as: TAMIFLU Take 1 capsule (30 mg total) by mouth two (2) times a day.       CHANGE how you take these medications    predniSONE  20 MG tablet Commonly known as: DELTASONE  Take 2 tablets (40 mg total) by mouth daily for 4 days, THEN 1 tablet (20 mg total) daily for 4 days, THEN 0.5 tablets (10 mg total) daily for 4 days. Start taking on: October 10, 2024 What changed:  See the new instructions. Another medication with the same name was removed. Continue taking this medication, and follow the directions you see here.       CONTINUE taking these  medications    acetaminophen  500 MG tablet Commonly known as: TYLENOL  Take 2 tablets (1,000 mg total) by mouth daily as needed.   albuterol  90 mcg/actuation inhaler Commonly known as: PROVENTIL  HFA;VENTOLIN  HFA Inhale 1 puff every four (4) hours as needed for shortness of breath.   ALPRAZolam  0.5 MG tablet Commonly known as: XANAX  Take 1 tablet (0.5 mg total) by mouth in the morning.   ascorbic acid  (vitamin C ) 250 MG tablet Commonly known as: VITAMIN C  Take 1 tablet (250 mg total) by mouth.   BREZTRI  AEROSPHERE 160-9-4.8 mcg/actuation inhaler Generic drug: budesonide -glycopyr-formoterol  Inhale 2 puffs two (2) times a day.   cholecalciferol  (vitamin D3 25 mcg (1,000 units)) 1,000 unit (25 mcg) tablet Take 1 tablet (25 mcg total) by mouth.   docusate sodium  100 MG capsule  Commonly known as: COLACE Take 2 capsules (200 mg total) by mouth two (2) times a day.   famotidine  20 MG tablet Commonly known as: PEPCID  Take 1 tablet (20 mg total) by mouth daily as needed.   HYDROcodone -acetaminophen  5-325 mg per tablet Commonly known as: NORCO Take 1 tablet by mouth two (2) times a day as needed for pain.   multivitamin capsule Take 1 capsule by mouth.   ondansetron  4 MG tablet Commonly known as: ZOFRAN  Take 1 tablet (4 mg total) by mouth every eight (8) hours as needed.   risedronate 150 MG tablet Commonly known as: ACTONEL Take 1 tablet (150 mg total) by mouth.       ASK your doctor about these medications    benzonatate  100 MG capsule Commonly known as: TESSALON  Take 1 capsule (100 mg total) by mouth.   famotidine  20 MG tablet Commonly known as: PEPCID  Take 1 tablet (20 mg total) by mouth two (2) times a day.   guaiFENesin  100 mg/5 mL syrup Commonly known as: ROBITUSSIN Take 10 mL (200 mg total) by mouth Three (3) times a day as needed for cough.   prednisoLONE acetate 1 % ophthalmic suspension Commonly known as: PRED FORTE Instill one drop into THE LEFT eye  THREE TIMES DAILY x3 WEEKS       _____________________________________  Nutrition:                                                 ___________________________________________  Discharge Instructions   Nutrition:                                   Activity:                                    Appointments:                          Follow Up:                                  Allergies  Allergen Reactions   Sulfa (Sulfonamide Antibiotics) Shortness Of Breath    Other reaction(s): Other (See Comments)  Dizziness   Ciprofloxacin Other (See Comments)    GI pain, headache. Pt states she had while she was in hospital (IV) and was ok  GI pain, headache. Pt states she had while she was in hospital (IV) and was ok     Lactose Other (See Comments)    Intolerant per patient   Levofloxacin      Hallucinations   Sulfamethoxazole Other (See Comments)   Aspirin Nausea And Vomiting   Cephalexin Rash   Clindamycin Rash   Hydromorphone Other (See Comments) and Rash    Rash on back, redness   Penicillins Rash     Past Medical History[1]  Past Surgical History[2]   Family History[3]   Current Medications[4]  Imaging  PVL Venous Duplex Lower Extremity Bilateral Result Date: 10/10/2024 Exam:  Bilateral Lower Extremity DVT Ultrasound Exam  History:  Pulmonary embolus  Technique:  Real-time grayscale compression ultrasound of both lower extremities was performed from  the common femoral vein through the popliteal vein to include the proximal end of the greater saphenous vein. The calf veins were also interrogated to the extent that they were visible. This examination was supplemented with color flow and Doppler interrogation.  Comparison:  None  Findings: Occlusive and partially occlusive thrombus seen in the mid right femoral vein through right popliteal vein. Thrombus is seen in the visualized left common femoral vein through saphenofemoral junction.  There is no popliteal  fossa mass or cyst on either side.    Bilateral deep venous thrombosis as above.  Signed (Electronic Signature): 10/10/2024 12:39 PM Signed By: Dorothyann Jointer, MD  CTA Chest W Contrast Result Date: 10/09/2024 Exam:  CT Angiogram Chest (Pulmonary Embolism Protocol)  History:  Shortness of breath, evaluate for pulmonary embolism  Technique: Chest CTA with IV contrast (pulmonary embolism protocol). This examination was specifically tailored to evaluate the pulmonary arteries for the presence of intraluminal thrombus.  Volumetric axial CT Maximum Intensity Projection (MIP) pulmonary angiographic images were generated at the scanner and sent to PACS for review. AEC (automated exposure control) and/or manual techniques such as size-specific kV and mAs are employed where appropriate to reduce radiation exposure for all CT exams.  Comparison:  None  Chest CT Findings:  CHEST WALL/NECK BASE: No supraclavicular or axillary lymphadenopathy.  MEDIASTINUM:  No mediastinal or hilar adenopathy.  VASCULATURE: Small nonocclusive segmental and subsegmental pulmonary emboli in the right middle lobe (series 9, images 124, 30, and 132). The thoracic aorta is normal in caliber.  CARDIAC: Normal heart size. No pericardial effusion. Minimal coronary artery calcifications.  LUNGS/PLEURA: Patent central airways with diffuse bronchial wall thickening and mucous plugging in the bilateral lower lobes. Mild nodular consolidation in the bilateral lung bases. Severe centrilobular emphysema. Biapical scarring. No suspicious pulmonary nodules or opacities.  No pleural effusion or pneumothorax.  UPPER ABDOMEN: The visualized portion of the upper abdomen is unremarkable.  BONES:  No aggressive appearing osseous lesions or acute osseous abnormalities. Chronic compression deformities of the T6-T8 vertebral bodies with associated focal thoracic kyphosis.      1.    Small nonocclusive segmental and subsegmental pulmonary emboli in the right middle  lobe. 2.    Diffuse bilateral lower lobe bronchial wall thickening and mucous plugging with mild nodular consolidation in the bilateral lung bases, likely reflecting aspiration or infection. 3.    Severe emphysema.  Signed (Electronic Signature): 10/09/2024 1:45 PM Signed By: Reyes Agreste, MD  XR Chest 2 views Result Date: 10/08/2024 Exam:  Chest Two Views  History:  Shortness of breath.  Technique:  2 views  Comparison:  Chest radiograph 09/28/2024 and earlier  Findings: Patient is turned to the left. Lungs are hyperinflated. No significant change in chronic interstitial opacities. Stable biapical scarring. No demonstrable pleural effusion or pneumothorax. The cardiac silhouette is within normal limits accounting for leftward rotation. Atherosclerotic calcification of the aorta. Chronic thoracic compression fractures.    No significant change in chronic interstitial opacities suggesting COPD.    Signed (Electronic Signature): 10/08/2024 12:00 PM Signed By: Hassell Moles, MD  ECG 12 Lead Result Date: 10/08/2024 Normal sinus rhythm Right atrial enlargement Borderline ECG When compared with ECG of 17-Jan-2022 20:24, No significant change was found   Lab Results   Recent Labs    10/08/24 1142  WBC 13.5*  HGB 12.8  HCT 37.9  PLT 198   Recent Labs    10/08/24 1355  NA 141  K 4.8  CL 107  CO2 29.3  BUN 42*  CREATININE 0.87  GLU 85  CALCIUM 8.8  ALBUMIN 2.3*  PROT 6.0  BILITOT 1.2  AST 29  ALT 51  ALKPHOS 91  MG 2.1   Recent Labs    10/08/24 1246 10/08/24 1355  TROPONINI  --  8  DDIMER 6,390*  --    Recent Labs    10/08/24 2103  WBCUA 13*  NITRITE Negative  LEUKOCYTESUR Trace*  BACTERIA None Seen  RBCUA 5*  BLOODU Trace*  GLUCOSEU Negative  PROTEINUA Negative  KETONESU Negative   No results for input(s): OPIAU, BENZU, TRICYCLIC, PCPU, AMPHU, COCAU, CANNAU, BARBU, ETOH, ACETAMIN, SALICYLATE in the last 72 hours. No results for input(s):  PREGTESTUR, PREGPOC in the last 72 hours. No results for input(s): OCCULTBLD, RAPSCRN, CDIFRPCR, CDIFFNAP1, A1C, CHOL, LDL, HDL, TRIG in the last 72 hours. Recent Labs    10/08/24 1157  PHART 7.45  PCO2ART 41.7  PO2ART 127*  HCO3ART 29.1*  O2SATART 99.3*  BEART 4.6*   Pending Labs     Order Current Status   Urine Culture In process       Home Medications   Prior to Admission medications  Medication Dose, Route, Frequency  acetaminophen  (TYLENOL ) 500 MG tablet 1,000 mg, Daily PRN  albuterol  HFA 90 mcg/actuation inhaler 1 puff, Every 4 hours PRN  alendronate (FOSAMAX) 70 MG tablet No dose, route, or frequency recorded.  ALPRAZolam  (XANAX ) 0.5 MG tablet Take 1 tablet (0.5 mg total) by mouth in the morning.  ascorbic acid , vitamin C , (VITAMIN C ) 250 MG tablet 250 mg  cholecalciferol , vitamin D3, 1,000 unit tablet 1,000 Units  docusate sodium  (COLACE) 100 MG capsule 200 mg, Oral, 2 times a day (standard)  famotidine  (PEPCID ) 20 MG tablet 20 mg, Daily PRN  HYDROcodone -acetaminophen  (NORCO) 5-325 mg per tablet 1 tablet, 2 times a day PRN  multivitamin capsule 1 capsule  ondansetron  (ZOFRAN ) 4 MG tablet 4 mg, Every 8 hours PRN  risedronate (ACTONEL) 150 MG tablet 150 mg  benzonatate  (TESSALON ) 100 MG capsule 100 mg Patient not taking: Reported on 10/08/2024  budesonide -glycopyr-formoterol  (BREZTRI  AEROSPHERE) 160-9-4.8 mcg/actuation inhaler 2 puffs, Inhalation, 2 times a day (standard) Patient not taking: Reported on 10/08/2024  famotidine  (PEPCID ) 20 MG tablet 20 mg, Oral, 2 times a day (standard) Patient not taking: Reported on 10/08/2024  guaiFENesin  (ROBITUSSIN) 100 mg/5 mL syrup 200 mg, Oral, 3 times a day PRN Patient not taking: Reported on 10/08/2024  ipratropium-albuterol  (DUO-NEB) 0.5-2.5 mg/3 mL nebulizer 3 mL, Nebulization, Every 6 hours PRN  levoFLOXacin  (LEVAQUIN ) 500 MG tablet 500 mg, Oral, Daily (standard)  oseltamivir (TAMIFLU) 30 MG capsule 30  mg, Oral, 2 times a day (standard)  prednisoLONE acetate (PRED FORTE) 1 % ophthalmic suspension Instill one drop into THE LEFT eye THREE TIMES DAILY x3 WEEKS Patient not taking: Reported on 10/08/2024  predniSONE  (DELTASONE ) 20 MG tablet 20 mg, Oral, Daily (standard) Patient not taking: Reported on 10/08/2024  predniSONE  (DELTASONE ) 20 MG tablet 3 po daily for 2 days, then 2 po daily for 2 days, then 1 po daily for 2 days Patient not taking: Reported on 10/08/2024  predniSONE  (DELTASONE ) 20 MG tablet Take 2 tablets (40 mg total) by mouth daily for 4 days, THEN 1 tablet (20 mg total) daily for 4 days, THEN 0.5 tablets (10 mg total) daily for 4 days.   Elspeth GORMAN Kay, MD Hospitalist, [facility] 10/10/2024, 9:39 AM       [1] Past Medical History: Diagnosis Date   Bladder cancer    (  CMS-HCC)    Bladder cancer    (CMS-HCC)    COPD (chronic obstructive pulmonary disease) (CMS-HCC)    Lung abnormality    lung collasped X 2   Osteoporosis   [2] Past Surgical History: Procedure Laterality Date   APPENDECTOMY     BLADDER SURGERY     HYSTERECTOMY     REVISION UROSTOMY CUTANEOUS     VR CHEST TUBE PLACEMENT - PNEUMOTHORAX ([facility] HISTORICAL RESULT)    [3] Family History Problem Relation Age of Onset   No Known Problems Mother    No Known Problems Father   [4]  Current Facility-Administered Medications:    acetaminophen  (TYLENOL ) tablet 650 mg, 650 mg, Oral, Q4H PRN, Julieann Elspeth Crawley, MD, 650 mg at 10/08/24 1930   ALPRAZolam  (XANAX ) tablet 0.5 mg, 0.5 mg, Oral, Nightly PRN, Julieann Elspeth Crawley, MD, 0.5 mg at 10/09/24 2218   docusate sodium  (COLACE) capsule 200 mg, 200 mg, Oral, BID, Julieann Elspeth Crawley, MD, 200 mg at 10/08/24 2315   enoxaparin  (LOVENOX ) syringe 30 mg, 30 mg, Subcutaneous, Q24H, Julieann Elspeth Crawley, MD   famotidine  (PEPCID ) tablet 20 mg, 20 mg, Oral, Daily, Julieann Elspeth Crawley, MD, 20 mg at 10/10/24 0902   HYDROcodone -acetaminophen  (NORCO) 5-325 mg per  tablet 1 tablet, 1 tablet, Oral, Q6H PRN, Julieann Elspeth Crawley, MD, 1 tablet at 10/09/24 0115   ipratropium-albuterol  (DUO-NEB) 0.5-2.5 mg/3 mL nebulizer solution 3 mL, 3 mL, Nebulization, Q6H (RT), Julieann Elspeth Crawley, MD, 3 mL at 10/10/24 0340   levoFLOXacin  (LEVAQUIN ) tablet 500 mg, 500 mg, Oral, daily, Julieann Elspeth Crawley, MD, 500 mg at 10/10/24 0618   melatonin tablet 3 mg, 3 mg, Oral, Nightly PRN, Julieann Elspeth Crawley, MD   oseltamivir (TAMIFLU) capsule 30 mg, 30 mg, Oral, BID, Julieann Elspeth Crawley, MD, 30 mg at 10/10/24 0902   polyethylene glycol (MIRALAX ) packet 17 g, 17 g, Oral, Daily PRN, Julieann Elspeth Crawley, MD   predniSONE  (DELTASONE ) tablet 40 mg, 40 mg, Oral, Daily, Julieann Elspeth Crawley, MD, 40 mg at 10/10/24 0901 "

## 2024-10-10 NOTE — Nursing Note (Signed)
 Pt discharged home with family. AVS reviewed with patient. Meds sent to [facility]. VSS. No acute distress.

## 2024-10-13 ENCOUNTER — Inpatient Hospital Stay (HOSPITAL_COMMUNITY)
Admission: EM | Admit: 2024-10-13 | Discharge: 2024-10-15 | DRG: 640 | Disposition: A | Discharge status: Home Health | Attending: Internal Medicine | Admitting: Internal Medicine

## 2024-10-13 ENCOUNTER — Encounter: Payer: Self-pay | Admitting: General Practice

## 2024-10-13 ENCOUNTER — Other Ambulatory Visit: Payer: Self-pay

## 2024-10-13 ENCOUNTER — Emergency Department (HOSPITAL_COMMUNITY)

## 2024-10-13 ENCOUNTER — Encounter (HOSPITAL_COMMUNITY): Payer: Self-pay

## 2024-10-13 DIAGNOSIS — E43 Unspecified severe protein-calorie malnutrition: Secondary | ICD-10-CM | POA: Diagnosis present

## 2024-10-13 DIAGNOSIS — R531 Weakness: Secondary | ICD-10-CM

## 2024-10-13 DIAGNOSIS — F419 Anxiety disorder, unspecified: Secondary | ICD-10-CM | POA: Diagnosis present

## 2024-10-13 DIAGNOSIS — Z87440 Personal history of urinary (tract) infections: Secondary | ICD-10-CM

## 2024-10-13 DIAGNOSIS — K219 Gastro-esophageal reflux disease without esophagitis: Secondary | ICD-10-CM | POA: Diagnosis present

## 2024-10-13 DIAGNOSIS — Z881 Allergy status to other antibiotic agents status: Secondary | ICD-10-CM

## 2024-10-13 DIAGNOSIS — Z91011 Allergy to milk products, unspecified: Secondary | ICD-10-CM

## 2024-10-13 DIAGNOSIS — N760 Acute vaginitis: Secondary | ICD-10-CM | POA: Diagnosis present

## 2024-10-13 DIAGNOSIS — Z8551 Personal history of malignant neoplasm of bladder: Secondary | ICD-10-CM

## 2024-10-13 DIAGNOSIS — J9611 Chronic respiratory failure with hypoxia: Secondary | ICD-10-CM | POA: Diagnosis present

## 2024-10-13 DIAGNOSIS — Z886 Allergy status to analgesic agent status: Secondary | ICD-10-CM

## 2024-10-13 DIAGNOSIS — Z7901 Long term (current) use of anticoagulants: Secondary | ICD-10-CM

## 2024-10-13 DIAGNOSIS — Z7952 Long term (current) use of systemic steroids: Secondary | ICD-10-CM

## 2024-10-13 DIAGNOSIS — Z882 Allergy status to sulfonamides status: Secondary | ICD-10-CM

## 2024-10-13 DIAGNOSIS — N390 Urinary tract infection, site not specified: Secondary | ICD-10-CM | POA: Diagnosis present

## 2024-10-13 DIAGNOSIS — Z79899 Other long term (current) drug therapy: Secondary | ICD-10-CM

## 2024-10-13 DIAGNOSIS — Z936 Other artificial openings of urinary tract status: Secondary | ICD-10-CM

## 2024-10-13 DIAGNOSIS — E860 Dehydration: Principal | ICD-10-CM | POA: Diagnosis present

## 2024-10-13 DIAGNOSIS — Z7951 Long term (current) use of inhaled steroids: Secondary | ICD-10-CM

## 2024-10-13 DIAGNOSIS — J449 Chronic obstructive pulmonary disease, unspecified: Principal | ICD-10-CM | POA: Diagnosis present

## 2024-10-13 DIAGNOSIS — Z86711 Personal history of pulmonary embolism: Secondary | ICD-10-CM

## 2024-10-13 DIAGNOSIS — N179 Acute kidney failure, unspecified: Secondary | ICD-10-CM | POA: Diagnosis present

## 2024-10-13 DIAGNOSIS — Z87891 Personal history of nicotine dependence: Secondary | ICD-10-CM

## 2024-10-13 DIAGNOSIS — Z86718 Personal history of other venous thrombosis and embolism: Secondary | ICD-10-CM

## 2024-10-13 DIAGNOSIS — Z880 Allergy status to penicillin: Secondary | ICD-10-CM

## 2024-10-13 DIAGNOSIS — R319 Hematuria, unspecified: Secondary | ICD-10-CM | POA: Diagnosis present

## 2024-10-13 DIAGNOSIS — J189 Pneumonia, unspecified organism: Secondary | ICD-10-CM | POA: Diagnosis present

## 2024-10-13 DIAGNOSIS — J479 Bronchiectasis, uncomplicated: Secondary | ICD-10-CM

## 2024-10-13 DIAGNOSIS — Z90710 Acquired absence of both cervix and uterus: Secondary | ICD-10-CM

## 2024-10-13 DIAGNOSIS — Z681 Body mass index (BMI) 19 or less, adult: Secondary | ICD-10-CM

## 2024-10-13 DIAGNOSIS — J441 Chronic obstructive pulmonary disease with (acute) exacerbation: Secondary | ICD-10-CM | POA: Diagnosis present

## 2024-10-13 DIAGNOSIS — G629 Polyneuropathy, unspecified: Secondary | ICD-10-CM | POA: Diagnosis present

## 2024-10-13 DIAGNOSIS — Z8616 Personal history of COVID-19: Secondary | ICD-10-CM

## 2024-10-13 DIAGNOSIS — Z885 Allergy status to narcotic agent status: Secondary | ICD-10-CM

## 2024-10-13 DIAGNOSIS — Z9981 Dependence on supplemental oxygen: Secondary | ICD-10-CM

## 2024-10-13 LAB — CBC
HCT: 35.9 % — ABNORMAL LOW (ref 36.0–46.0)
Hemoglobin: 11.2 g/dL — ABNORMAL LOW (ref 12.0–15.0)
MCH: 31.7 pg (ref 26.0–34.0)
MCHC: 31.2 g/dL (ref 30.0–36.0)
MCV: 101.7 fL — ABNORMAL HIGH (ref 80.0–100.0)
Platelets: 211 K/uL (ref 150–400)
RBC: 3.53 MIL/uL — ABNORMAL LOW (ref 3.87–5.11)
RDW: 14.8 % (ref 11.5–15.5)
WBC: 11.2 K/uL — ABNORMAL HIGH (ref 4.0–10.5)
nRBC: 0 % (ref 0.0–0.2)

## 2024-10-13 LAB — URINALYSIS, ROUTINE W REFLEX MICROSCOPIC
Bilirubin Urine: NEGATIVE
Glucose, UA: NEGATIVE mg/dL
Ketones, ur: NEGATIVE mg/dL
Nitrite: NEGATIVE
Protein, ur: 30 mg/dL — AB
RBC / HPF: >50 RBC/hpf (ref 0–5)
Specific Gravity, Urine: 1.015 (ref 1.005–1.030)
pH: 6 (ref 5.0–8.0)

## 2024-10-13 LAB — BASIC METABOLIC PANEL WITH GFR
Anion gap: 13 (ref 5–15)
BUN: 24 mg/dL — ABNORMAL HIGH (ref 8–23)
CO2: 24 mmol/L (ref 22–32)
Calcium: 9 mg/dL (ref 8.9–10.3)
Chloride: 105 mmol/L (ref 98–111)
Creatinine, Ser: 1.04 mg/dL — ABNORMAL HIGH (ref 0.44–1.00)
GFR, Estimated: 53 mL/min — ABNORMAL LOW
Glucose, Bld: 116 mg/dL — ABNORMAL HIGH (ref 70–99)
Potassium: 5.2 mmol/L — ABNORMAL HIGH (ref 3.5–5.1)
Sodium: 142 mmol/L (ref 135–145)

## 2024-10-13 MED ORDER — ALBUTEROL SULFATE (2.5 MG/3ML) 0.083% IN NEBU
5.0000 mg | INHALATION_SOLUTION | Freq: Once | RESPIRATORY_TRACT | Status: AC
  Administered 2024-10-13: 5 mg via RESPIRATORY_TRACT
  Filled 2024-10-13: qty 6

## 2024-10-13 MED ORDER — IPRATROPIUM BROMIDE 0.02 % IN SOLN
0.5000 mg | Freq: Once | RESPIRATORY_TRACT | Status: AC
  Administered 2024-10-13: 0.5 mg via RESPIRATORY_TRACT
  Filled 2024-10-13: qty 2.5

## 2024-10-13 MED ORDER — APIXABAN 5 MG PO TABS
10.0000 mg | ORAL_TABLET | Freq: Once | ORAL | Status: AC
  Administered 2024-10-13: 10 mg via ORAL
  Filled 2024-10-13: qty 2

## 2024-10-13 NOTE — ED Triage Notes (Signed)
 Pt had the flu a few days before christmas and then was hospitalized for 2 small PE's and DVT's in both legs on 1/10 at another hospital.  Pt daughter is concerned because she was started on eliquis  and pt now has blood in her urine draining from her urostomy and feels very fatigued.

## 2024-10-13 NOTE — H&P (Addendum)
 " History and Physical  Amanda [name] FMW:[id number] DOB: 26-Dec-1940 DOA: 10/13/2024  Referring physician: Dr. Patsey, EDP  PCP: Renato Dorothey HERO, NP  Outpatient Specialists: Cardiothoracic surgery, urology. Patient coming from: Home, lives with her daughter.  Chief Complaint: Generalized weakness.  HPI: Amanda [name] is a 84 y.o. female with medical history significant for recurrent spontaneous pneumothorax, previously seen by cardiothoracic surgery, former smoker, COPD on 2 L nasal cannula at baseline, recurrent complicated UTIs, history of bladder cancer status post urostomy, recently admitted at [facility] on 10/08/2024 for COPD exacerbation during that admission she was found to have pneumonia, DVTs, and pulmonary embolism, was discharged on Levaquin  and Eliquis  on 10/10/2024, presents to the ER due to generalized weakness and poor oral intake.  She has not been able to get out of bed since she returned home from [facility].  Denies any subjective fevers or chills.  Daughter noted some blood in her urine from her urostomy bag and was concerned.  She brought her to the ER for further evaluation.  In the ER, frail-appearing.  Tachycardic.  Chest x-ray was nonacute.  UA was positive for pyuria.  The patient received 1 breathing treatment Atrovent  nebulizers.  TRH, hospitalist service, was asked to admit due to generalized weakness.  ED Course: Temperature 97.8.  BP 112/76, pulse 103, respiration rate 18, O2 saturation 100% on 3 L [place].  Review of Systems: Review of systems as noted in the HPI. All other systems reviewed and are negative.   Past Medical History:  Diagnosis Date   Anemia    Anxiety    Arthritis    Bladder cancer (HCC)    bladder cancer   Blood transfusion without reported diagnosis    COPD (chronic obstructive pulmonary disease) (HCC)    COVID-19 07/2019   Dyspnea    Former smoker    History of kidney stones    Pneumonia    Renal disorder    bladder cancer   Requires  continuous at home supplemental oxygen     via [place]   Seizures (HCC)    over 25 yrs ago, no problems since per patient as of 02/01/20   Spontaneous pneumothorax - recurrent 05/2018   right   Wears dentures    Wears glasses    Past Surgical History:  Procedure Laterality Date   ABDOMINAL HYSTERECTOMY     CHEST TUBE INSERTION Right 02/02/2020   Procedure: CHEST TUBE REMOVAL;  Surgeon: Kerrin Elspeth BROCKS, MD;  Location: [facility] OR;  Service: Thoracic;  Laterality: Right;   CYSTECTOMY W/ URETEROILEAL CONDUIT  2009   DILATION AND CURETTAGE OF UTERUS     VIDEO BRONCHOSCOPY WITH INSERTION OF INTERBRONCHIAL VALVE (IBV) N/A 10/12/2019   Procedure: VIDEO BRONCHOSCOPY WITH INSERTION OF INTERBRONCHIAL VALVES (IBV)  TIMES THREE RIGHT UPPER LOBE , ONE MIDDLE LOBE.;  Surgeon: Kerrin Elspeth BROCKS, MD;  Location: [facility] OR;  Service: Thoracic;  Laterality: N/A;   VIDEO BRONCHOSCOPY WITH INSERTION OF INTERBRONCHIAL VALVE (IBV) N/A 12/01/2019   Procedure: VIDEO BRONCHOSCOPY WITH INSERTION OF INTERBRONCHIAL VALVE (IBV);  Surgeon: Kerrin Elspeth BROCKS, MD;  Location: [facility] OR;  Service: Thoracic;  Laterality: N/A;   VIDEO BRONCHOSCOPY WITH INSERTION OF INTERBRONCHIAL VALVE (IBV) N/A 02/02/2020   Procedure: VIDEO BRONCHOSCOPY WITH REMOVAL OF INTERBRONCHIAL VALVE (IBV) times eight;  Surgeon: Kerrin Elspeth BROCKS, MD;  Location: [facility] OR;  Service: Thoracic;  Laterality: N/A;    Social History:  reports that she quit smoking about 8 years ago. Her smoking use included cigarettes. She  has been exposed to tobacco smoke. She has never used smokeless tobacco. She reports that she does not drink alcohol and does not use drugs.   Allergies[1]  Family history: None reported.  Prior to Admission medications  Medication Sig Start Date End Date Taking? Authorizing Provider  acetaminophen  (TYLENOL ) 500 MG tablet Take 500-1,000 mg by mouth daily as needed for mild pain or moderate pain.     [provider]  albuterol  (PROVENTIL   HFA;VENTOLIN  HFA) 108 (90 Base) MCG/ACT inhaler Inhale 1-2 puffs into the lungs every 6 (six) hours as needed for wheezing or shortness of breath. 09/29/18   Towana Ozell BROCKS, MD  ALPRAZolam  (XANAX ) 0.5 MG tablet Take 0.25 mg by mouth daily as needed for anxiety. 12/24/19   [provider]  ascorbic acid  (GNP VITAMIN C ) 500 MG tablet Take 500 mg by mouth daily.    [provider]  Ascorbic Acid  (VITAMIN C ) 500 MG CAPS Take 500 mg by mouth daily.     [provider]  benzonatate  (TESSALON ) 100 MG capsule Take 1 capsule (100 mg total) by mouth 3 (three) times daily as needed for cough. 07/30/24   Hinnant, Collin F, PA-C  Cholecalciferol  (VITAMIN D -3) 125 MCG (5000 UT) TABS Take 5,000 Units by mouth daily.    [provider]  docusate sodium  (COLACE) 100 MG capsule Take 1 capsule (100 mg total) by mouth 2 (two) times daily. Patient taking differently: Take 100 mg by mouth daily.  10/19/19   Vann, Jessica U, DO  guaiFENesin  (MUCINEX ) 600 MG 12 hr tablet Take 600 mg by mouth daily.    [provider]  ipratropium-albuterol  (DUONEB) 0.5-2.5 (3) MG/3ML SOLN Take 3 mLs by nebulization every 6 (six) hours as needed for wheezing or shortness of breath. 01/03/20   [provider]  Multiple Vitamin (MULTIVITAMIN WITH MINERALS) TABS tablet Take 1 tablet by mouth daily. Centrum Multivitamin A to Zinc    [provider]  predniSONE  (DELTASONE ) 20 MG tablet 2 tabs po daily x 4 days 07/05/23   Freddi Hamilton, MD  SYMBICORT  80-4.5 MCG/ACT inhaler Inhale 2 puffs into the lungs 2 (two) times daily. 09/05/19   [provider]    Physical Exam: BP 104/69 (BP Location: Right Arm)   Pulse (!) 116   Temp 98.9 F (37.2 C) (Oral)   Resp 19   Wt 44.5 kg   SpO2 99%   BMI 16.33 kg/m   General: 84 y.o. year-old female frail-appearing in no acute distress.  Alert and oriented x3. Cardiovascular: Tachycardic with no rubs or gallops.  No thyromegaly or JVD  noted.  No lower extremity edema. 2/4 pulses in all 4 extremities. Respiratory: Clear to auscultation with no wheezes or rales. Good inspiratory effort. Abdomen: Soft nontender nondistended with normal bowel sounds x4 quadrants. Muskuloskeletal: No cyanosis, clubbing or edema noted bilaterally Neuro: CN II-XII intact, strength, sensation, reflexes Skin: No ulcerative lesions noted or rashes Psychiatry: Judgement and insight appear normal. Mood is appropriate for condition and setting          Labs on Admission:  Basic Metabolic Panel: Recent Labs  Lab 10/13/24 1716  NA 142  K 5.2*  CL 105  CO2 24  GLUCOSE 116*  BUN 24*  CREATININE 1.04*  CALCIUM 9.0   Liver Function Tests: No results for input(s): AST, ALT, ALKPHOS, BILITOT, PROT, ALBUMIN in the last 168 hours. No results for input(s): LIPASE, AMYLASE in the last 168 hours. No results for input(s): AMMONIA in  the last 168 hours. CBC: Recent Labs  Lab 10/13/24 1716  WBC 11.2*  HGB 11.2*  HCT 35.9*  MCV 101.7*  PLT 211   Cardiac Enzymes: No results for input(s): CKTOTAL, CKMB, CKMBINDEX, TROPONINI in the last 168 hours.  BNP (last 3 results) No results for input(s): BNP in the last 8760 hours.  ProBNP (last 3 results) No results for input(s): PROBNP in the last 8760 hours.  CBG: No results for input(s): GLUCAP in the last 168 hours.  Radiological Exams on Admission: DG Chest Port 1 View Result Date: 10/13/2024 CLINICAL DATA:  Shortness of breath. EXAM: PORTABLE CHEST 1 VIEW COMPARISON:  Chest radiograph dated 09/01/2024. FINDINGS: Background of emphysema. No focal consolidation, pleural effusion or pneumothorax. The cardiac silhouette is within limits. Osteopenia with degenerative changes of the spine and scoliosis. No acute osseous pathology. IMPRESSION: 1. No active disease. 2. Emphysema. Electronically Signed   By: Vanetta Chou M.D.   On: 10/13/2024 18:19    EKG: I  independently viewed the EKG done and my findings are as followed: None available at the time of this visit.  Assessment/Plan Present on Admission: **None**  Principal Problem:   Generalized weakness  Generalized weakness, multifactorial Recent admission for COPD, pneumonia, DVT/pulmonary embolism Presumptive UTI, POA Continue to treat underlying conditions PT OT evaluation Fall precautions  Presumptive UTI, POA Per her daughter her symptoms are similar to prior UTI UA positive for pyuria Fosfomycin  x 1 dose.  AKI, prerenal from poor oral intake Baseline creatinine 0.7 with GFR greater than 60 Presented with creatinine of 1.04 with GFR 53 Gentle IV fluid hydration NS at 75 cc/h x 1 day Monitor urine output Avoid nephrotoxic agents, dehydration, and hypotension.  Severe protein calorie malnutrition Severe muscle mass loss BMI 14 Dietitian consultation Liberalize diet Encourage oral protein calorie intake as tolerated.  Recent diagnosis of pneumonia and COPD exacerbation Was discharged from [facility] on 10/10/2024 on p.o. Levaquin  and prednisone . Resume home Levaquin  to complete course of antibiotics Resume home prednisone  to complete treatment for COPD exacerbation. DuoNebs every 6 hours and every 2 hours as needed Early mobilization  Recently diagnosed DVT/PE Resume home Eliquis  10 mg twice daily x 3 days then 5 mg twice daily  GERD Resume home famotidine    Time: 75 minutes.   DVT prophylaxis: Home Eliquis  twice daily.  Code Status: Full code, as stated by the patient herself with her daughter present at bedside.  Family Communication: Updated patient's daughter at bedside  Disposition Plan: Admitted to telemetry unit.  Consults called: PT OT/dietitian  Admission status: Observation status   Status is: Observation    Terry LOISE Hurst MD [facility] Pager [phone]  If 7PM-7AM, please contact night-coverage [email or web address] Password  [email or web address]  10/13/2024, 10:49 PM      [1]  Allergies Allergen Reactions   Misc. Sulfonamide Containing Compounds Shortness Of Breath    Other reaction(s): Other (See Comments)    Dizziness   Sulfa Antibiotics Shortness Of Breath   Ciprofloxacin Other (See Comments)    GI pain, headache. Pt states she had while she was in hospital (IV) and was ok  GI pain, headache. Pt states she had while she was in hospital (IV) and was ok   GI pain, headache. Pt states she had while she was in hospital (IV) and was ok   Lactose Other (See Comments)    Intolerant per patient    Levofloxacin      Hallucinations   Sulfamethoxazole Other (See Comments)  Dizziness   Aspirin Nausea And Vomiting   Cephalexin Rash   Clindamycin Rash   Erythromycin Base Rash   Hydromorphone Rash and Other (See Comments)    Rash on back, redness     Penicillins Rash   "

## 2024-10-13 NOTE — ED Provider Notes (Signed)
 " [place] EMERGENCY DEPARTMENT AT [facility] Provider Note   CSN: [id number] Arrival date & time: 10/13/24  1553     Patient presents with: Hematuria   Amanda [name] is a 84 y.o. female.    Hematuria  Patient presents with shortness of breath and fatigue.  Had been hospitalized recently and discharged 3 days ago.  Had been diagnosed with COPD exacerbation and pulmonary embolisms.  Also recently had the flu.  Also potentially had a UTI.  Has a urostomy.  Is on chronic oxygen .  Has been more short of breath since left.  Has been really unable to get up and move around.  Occasional cough.  May have had some blood in her urostomy bag.  Has been started on Eliquis  for her PEs.    Past Medical History:  Diagnosis Date   Anemia    Anxiety    Arthritis    Bladder cancer (HCC)    bladder cancer   Blood transfusion without reported diagnosis    COPD (chronic obstructive pulmonary disease) (HCC)    COVID-19 07/2019   Dyspnea    Former smoker    History of kidney stones    Pneumonia    Renal disorder    bladder cancer   Requires continuous at home supplemental oxygen     via [place]   Seizures (HCC)    over 25 yrs ago, no problems since per patient as of 02/01/20   Spontaneous pneumothorax - recurrent 05/2018   right   Wears dentures    Wears glasses    Past Surgical History:  Procedure Laterality Date   ABDOMINAL HYSTERECTOMY     CHEST TUBE INSERTION Right 02/02/2020   Procedure: CHEST TUBE REMOVAL;  Surgeon: Kerrin Elspeth BROCKS, MD;  Location: [facility] OR;  Service: Thoracic;  Laterality: Right;   CYSTECTOMY W/ URETEROILEAL CONDUIT  2009   DILATION AND CURETTAGE OF UTERUS     VIDEO BRONCHOSCOPY WITH INSERTION OF INTERBRONCHIAL VALVE (IBV) N/A 10/12/2019   Procedure: VIDEO BRONCHOSCOPY WITH INSERTION OF INTERBRONCHIAL VALVES (IBV)  TIMES THREE RIGHT UPPER LOBE , ONE MIDDLE LOBE.;  Surgeon: Kerrin Elspeth BROCKS, MD;  Location: [facility] OR;  Service: Thoracic;  Laterality: N/A;    VIDEO BRONCHOSCOPY WITH INSERTION OF INTERBRONCHIAL VALVE (IBV) N/A 12/01/2019   Procedure: VIDEO BRONCHOSCOPY WITH INSERTION OF INTERBRONCHIAL VALVE (IBV);  Surgeon: Kerrin Elspeth BROCKS, MD;  Location: [facility] OR;  Service: Thoracic;  Laterality: N/A;   VIDEO BRONCHOSCOPY WITH INSERTION OF INTERBRONCHIAL VALVE (IBV) N/A 02/02/2020   Procedure: VIDEO BRONCHOSCOPY WITH REMOVAL OF INTERBRONCHIAL VALVE (IBV) times eight;  Surgeon: Kerrin Elspeth BROCKS, MD;  Location: [facility] OR;  Service: Thoracic;  Laterality: N/A;     Prior to Admission medications  Medication Sig Start Date End Date Taking? Authorizing Provider  acetaminophen  (TYLENOL ) 500 MG tablet Take 500-1,000 mg by mouth daily as needed for mild pain or moderate pain.     [provider]  albuterol  (PROVENTIL  HFA;VENTOLIN  HFA) 108 (90 Base) MCG/ACT inhaler Inhale 1-2 puffs into the lungs every 6 (six) hours as needed for wheezing or shortness of breath. 09/29/18   Towana Ozell BROCKS, MD  ALPRAZolam  (XANAX ) 0.5 MG tablet Take 0.25 mg by mouth daily as needed for anxiety. 12/24/19   [provider]  ascorbic acid  (GNP VITAMIN C ) 500 MG tablet Take 500 mg by mouth daily.    [provider]  Ascorbic Acid  (VITAMIN C ) 500 MG CAPS Take 500 mg by mouth daily.  [provider]  benzonatate  (TESSALON ) 100 MG capsule Take 1 capsule (100 mg total) by mouth 3 (three) times daily as needed for cough. 07/30/24   Hinnant, Collin F, PA-C  Cholecalciferol  (VITAMIN D -3) 125 MCG (5000 UT) TABS Take 5,000 Units by mouth daily.    [provider]  docusate sodium  (COLACE) 100 MG capsule Take 1 capsule (100 mg total) by mouth 2 (two) times daily. Patient taking differently: Take 100 mg by mouth daily.  10/19/19   Vann, Jessica U, DO  guaiFENesin  (MUCINEX ) 600 MG 12 hr tablet Take 600 mg by mouth daily.    [provider]  ipratropium-albuterol  (DUONEB) 0.5-2.5 (3) MG/3ML SOLN Take 3 mLs by nebulization every 6 (six) hours as  needed for wheezing or shortness of breath. 01/03/20   [provider]  Multiple Vitamin (MULTIVITAMIN WITH MINERALS) TABS tablet Take 1 tablet by mouth daily. Centrum Multivitamin A to Zinc    [provider]  predniSONE  (DELTASONE ) 20 MG tablet 2 tabs po daily x 4 days 07/05/23   Freddi Hamilton, MD  SYMBICORT  80-4.5 MCG/ACT inhaler Inhale 2 puffs into the lungs 2 (two) times daily. 09/05/19   [provider]    Allergies: Misc. sulfonamide containing compounds, Sulfa antibiotics, Ciprofloxacin, Lactose, Levofloxacin , Sulfamethoxazole, Aspirin, Cephalexin, Clindamycin, Erythromycin base, Hydromorphone, and Penicillins    Review of Systems  Genitourinary:  Positive for hematuria.    Updated Vital Signs BP 106/62 (BP Location: Right Arm)   Pulse (!) 118   Temp 99 F (37.2 C)   Resp 18   Wt 44.5 kg   SpO2 98%   BMI 16.33 kg/m   Physical Exam Vitals and nursing note reviewed.  Cardiovascular:     Rate and Rhythm: Tachycardia present.  Pulmonary:     Comments: Some diffuse wheezes.  Worse on right side. Abdominal:     Tenderness: There is no abdominal tenderness.  Musculoskeletal:     Right lower leg: No edema.     Left lower leg: No edema.  Skin:    Capillary Refill: Capillary refill takes less than 2 seconds.  Neurological:     Mental Status: She is alert and oriented to person, place, and time.     (all labs ordered are listed, but only abnormal results are displayed) Labs Reviewed  URINALYSIS, ROUTINE W REFLEX MICROSCOPIC - Abnormal; Notable for the following components:      Result Value   APPearance HAZY (*)    Hgb urine dipstick LARGE (*)    Protein, ur 30 (*)    Leukocytes,Ua SMALL (*)    Bacteria, UA RARE (*)    All other components within normal limits  CBC - Abnormal; Notable for the following components:   WBC 11.2 (*)    RBC 3.53 (*)    Hemoglobin 11.2 (*)    HCT 35.9 (*)    MCV 101.7 (*)    All other components within normal  limits  BASIC METABOLIC PANEL WITH GFR - Abnormal; Notable for the following components:   Potassium 5.2 (*)    Glucose, Bld 116 (*)    BUN 24 (*)    Creatinine, Ser 1.04 (*)    GFR, Estimated 53 (*)    All other components within normal limits    EKG: None  Radiology: [facility] Chest Port 1 View Result Date: 10/13/2024 CLINICAL DATA:  Shortness of breath. EXAM: PORTABLE CHEST 1 VIEW COMPARISON:  Chest radiograph dated 09/01/2024. FINDINGS: Background of emphysema. No focal consolidation, pleural  effusion or pneumothorax. The cardiac silhouette is within limits. Osteopenia with degenerative changes of the spine and scoliosis. No acute osseous pathology. IMPRESSION: 1. No active disease. 2. Emphysema. Electronically Signed   By: Vanetta Chou M.D.   On: 10/13/2024 18:19     Procedures   Medications Ordered in the ED  albuterol  (PROVENTIL ) (2.5 MG/3ML) 0.083% nebulizer solution 5 mg (5 mg Nebulization Given 10/13/24 1840)  ipratropium (ATROVENT ) nebulizer solution 0.5 mg (0.5 mg Nebulization Given 10/13/24 1907)                                    Medical Decision Making Amount and/or Complexity of Data Reviewed Labs: ordered. Radiology: ordered.  Risk Prescription drug management. Decision regarding hospitalization.   Patient shortness of breath.  Recent PE.  Recent COPD exacerbation.  More short of breath.  Also some blood in the urine and now on anticoagulation.  Has had UTIs.  Will check urinalysis x-ray and basic blood work.  Blood reassuring.  Creatinine may be slightly higher than it was.  CBC shows a mild anemia but pretty similar to what it was prior.  X-ray shows COPD.  Urine consistent with her urostomy.  However has been more short of breath.  I think more likely COPD as opposed to worsening PE.  States she would not be able to walk to the door appears mildly dyspneic.  Feels somewhat better after breathing treatment but I think required mission the hospital.  Will  discuss with hospitalist.  Is currently on Levaquin  and oral steroids     Final diagnoses:  Chronic obstructive pulmonary disease, unspecified COPD type [facility])    ED Discharge Orders     None          Patsey Lot, MD 10/13/24 2059  "

## 2024-10-14 ENCOUNTER — Encounter (HOSPITAL_COMMUNITY): Payer: Self-pay | Admitting: Internal Medicine

## 2024-10-14 DIAGNOSIS — J479 Bronchiectasis, uncomplicated: Secondary | ICD-10-CM | POA: Diagnosis not present

## 2024-10-14 DIAGNOSIS — J441 Chronic obstructive pulmonary disease with (acute) exacerbation: Secondary | ICD-10-CM | POA: Diagnosis present

## 2024-10-14 DIAGNOSIS — N390 Urinary tract infection, site not specified: Secondary | ICD-10-CM | POA: Diagnosis present

## 2024-10-14 DIAGNOSIS — Z87891 Personal history of nicotine dependence: Secondary | ICD-10-CM | POA: Diagnosis not present

## 2024-10-14 DIAGNOSIS — Z7952 Long term (current) use of systemic steroids: Secondary | ICD-10-CM | POA: Diagnosis not present

## 2024-10-14 DIAGNOSIS — Z7901 Long term (current) use of anticoagulants: Secondary | ICD-10-CM | POA: Diagnosis not present

## 2024-10-14 DIAGNOSIS — G629 Polyneuropathy, unspecified: Secondary | ICD-10-CM | POA: Diagnosis present

## 2024-10-14 DIAGNOSIS — R531 Weakness: Secondary | ICD-10-CM

## 2024-10-14 DIAGNOSIS — Z86718 Personal history of other venous thrombosis and embolism: Secondary | ICD-10-CM | POA: Diagnosis not present

## 2024-10-14 DIAGNOSIS — Z936 Other artificial openings of urinary tract status: Secondary | ICD-10-CM | POA: Diagnosis not present

## 2024-10-14 DIAGNOSIS — J189 Pneumonia, unspecified organism: Secondary | ICD-10-CM | POA: Diagnosis present

## 2024-10-14 DIAGNOSIS — E860 Dehydration: Secondary | ICD-10-CM | POA: Diagnosis present

## 2024-10-14 DIAGNOSIS — Z86711 Personal history of pulmonary embolism: Secondary | ICD-10-CM | POA: Diagnosis not present

## 2024-10-14 DIAGNOSIS — Z681 Body mass index (BMI) 19 or less, adult: Secondary | ICD-10-CM | POA: Diagnosis not present

## 2024-10-14 DIAGNOSIS — J9611 Chronic respiratory failure with hypoxia: Secondary | ICD-10-CM | POA: Diagnosis present

## 2024-10-14 DIAGNOSIS — J449 Chronic obstructive pulmonary disease, unspecified: Secondary | ICD-10-CM | POA: Diagnosis not present

## 2024-10-14 DIAGNOSIS — R319 Hematuria, unspecified: Secondary | ICD-10-CM | POA: Diagnosis present

## 2024-10-14 DIAGNOSIS — Z90710 Acquired absence of both cervix and uterus: Secondary | ICD-10-CM | POA: Diagnosis not present

## 2024-10-14 DIAGNOSIS — Z8551 Personal history of malignant neoplasm of bladder: Secondary | ICD-10-CM | POA: Diagnosis not present

## 2024-10-14 DIAGNOSIS — N760 Acute vaginitis: Secondary | ICD-10-CM | POA: Diagnosis present

## 2024-10-14 DIAGNOSIS — K219 Gastro-esophageal reflux disease without esophagitis: Secondary | ICD-10-CM | POA: Diagnosis present

## 2024-10-14 DIAGNOSIS — Z7951 Long term (current) use of inhaled steroids: Secondary | ICD-10-CM | POA: Diagnosis not present

## 2024-10-14 DIAGNOSIS — F419 Anxiety disorder, unspecified: Secondary | ICD-10-CM | POA: Diagnosis present

## 2024-10-14 DIAGNOSIS — N179 Acute kidney failure, unspecified: Secondary | ICD-10-CM | POA: Diagnosis present

## 2024-10-14 DIAGNOSIS — E43 Unspecified severe protein-calorie malnutrition: Secondary | ICD-10-CM | POA: Diagnosis present

## 2024-10-14 DIAGNOSIS — Z8616 Personal history of COVID-19: Secondary | ICD-10-CM | POA: Diagnosis not present

## 2024-10-14 DIAGNOSIS — Z9981 Dependence on supplemental oxygen: Secondary | ICD-10-CM | POA: Diagnosis not present

## 2024-10-14 MED ORDER — IPRATROPIUM-ALBUTEROL 0.5-2.5 (3) MG/3ML IN SOLN
3.0000 mL | Freq: Four times a day (QID) | RESPIRATORY_TRACT | Status: DC
  Administered 2024-10-14 – 2024-10-15 (×6): 3 mL via RESPIRATORY_TRACT
  Filled 2024-10-14 (×6): qty 3

## 2024-10-14 MED ORDER — ADULT MULTIVITAMIN W/MINERALS CH
1.0000 | ORAL_TABLET | Freq: Every day | ORAL | Status: DC
  Administered 2024-10-14 – 2024-10-15 (×2): 1 via ORAL
  Filled 2024-10-14 (×2): qty 1

## 2024-10-14 MED ORDER — APIXABAN 5 MG PO TABS: 5.0000 mg | ORAL_TABLET | Freq: Two times a day (BID) | ORAL | Status: DC

## 2024-10-14 MED ORDER — VITAMIN D 25 MCG (1000 UNIT) PO TABS
5000.0000 [IU] | ORAL_TABLET | Freq: Every day | ORAL | Status: DC
  Administered 2024-10-14 – 2024-10-15 (×2): 5000 [IU] via ORAL
  Filled 2024-10-14 (×2): qty 5

## 2024-10-14 MED ORDER — LEVOFLOXACIN 500 MG PO TABS: 500.0000 mg | ORAL_TABLET | Freq: Every day | ORAL | Status: DC

## 2024-10-14 MED ORDER — GABAPENTIN 100 MG PO CAPS
100.0000 mg | ORAL_CAPSULE | Freq: Every day | ORAL | Status: DC
  Filled 2024-10-14: qty 1

## 2024-10-14 MED ORDER — LEVOFLOXACIN 500 MG PO TABS: 500.0000 mg | ORAL_TABLET | Freq: Once | ORAL | Status: DC

## 2024-10-14 MED ORDER — GUAIFENESIN-DM 100-10 MG/5ML PO SYRP: 5.0000 mL | ORAL_SOLUTION | ORAL | Status: DC | PRN

## 2024-10-14 MED ORDER — POLYETHYLENE GLYCOL 3350 17 G PO PACK: 17.0000 g | PACK | Freq: Every day | ORAL | Status: DC | PRN

## 2024-10-14 MED ORDER — ACETAMINOPHEN 325 MG PO TABS
650.0000 mg | ORAL_TABLET | Freq: Four times a day (QID) | ORAL | Status: DC | PRN
  Administered 2024-10-14 – 2024-10-15 (×2): 650 mg via ORAL
  Filled 2024-10-14 (×2): qty 2

## 2024-10-14 MED ORDER — ALPRAZOLAM 0.25 MG PO TABS
0.2500 mg | ORAL_TABLET | Freq: Every day | ORAL | Status: DC | PRN
  Administered 2024-10-14: 0.25 mg via ORAL
  Filled 2024-10-14: qty 1

## 2024-10-14 MED ORDER — FLUCONAZOLE 100 MG PO TABS
100.0000 mg | ORAL_TABLET | Freq: Every day | ORAL | Status: DC
  Administered 2024-10-14 – 2024-10-15 (×2): 100 mg via ORAL
  Filled 2024-10-14 (×2): qty 1

## 2024-10-14 MED ORDER — CYANOCOBALAMIN 1000 MCG/ML IJ SOLN
1000.0000 ug | Freq: Every day | INTRAMUSCULAR | Status: DC
  Administered 2024-10-14 – 2024-10-15 (×2): 1000 ug via INTRAMUSCULAR
  Filled 2024-10-14 (×2): qty 1

## 2024-10-14 MED ORDER — BOOST / RESOURCE BREEZE PO LIQD CUSTOM
1.0000 | Freq: Three times a day (TID) | ORAL | Status: DC
  Administered 2024-10-14: 1 via ORAL

## 2024-10-14 MED ORDER — IPRATROPIUM-ALBUTEROL 0.5-2.5 (3) MG/3ML IN SOLN: 3.0000 mL | RESPIRATORY_TRACT | Status: DC | PRN

## 2024-10-14 MED ORDER — APIXABAN 5 MG PO TABS
10.0000 mg | ORAL_TABLET | Freq: Two times a day (BID) | ORAL | Status: DC
  Administered 2024-10-14 – 2024-10-15 (×3): 10 mg via ORAL
  Filled 2024-10-14 (×3): qty 2

## 2024-10-14 MED ORDER — MELATONIN 3 MG PO TABS
6.0000 mg | ORAL_TABLET | Freq: Every evening | ORAL | Status: DC | PRN
  Filled 2024-10-14: qty 2

## 2024-10-14 MED ORDER — BUDESONIDE 0.5 MG/2ML IN SUSP
0.5000 mg | Freq: Two times a day (BID) | RESPIRATORY_TRACT | Status: DC
  Administered 2024-10-14 – 2024-10-15 (×2): 0.5 mg via RESPIRATORY_TRACT
  Filled 2024-10-14 (×2): qty 2

## 2024-10-14 MED ORDER — LEVOFLOXACIN 500 MG PO TABS: 250.0000 mg | ORAL_TABLET | Freq: Every day | ORAL | Status: DC

## 2024-10-14 MED ORDER — PROCHLORPERAZINE EDISYLATE 10 MG/2ML IJ SOLN
5.0000 mg | Freq: Four times a day (QID) | INTRAMUSCULAR | Status: DC | PRN
  Administered 2024-10-15: 5 mg via INTRAVENOUS
  Filled 2024-10-14: qty 2

## 2024-10-14 MED ORDER — GUAIFENESIN ER 600 MG PO TB12
600.0000 mg | ORAL_TABLET | Freq: Every day | ORAL | Status: DC
  Administered 2024-10-14 – 2024-10-15 (×2): 600 mg via ORAL
  Filled 2024-10-14 (×2): qty 1

## 2024-10-14 MED ORDER — FAMOTIDINE 20 MG PO TABS
20.0000 mg | ORAL_TABLET | Freq: Every day | ORAL | Status: DC
  Administered 2024-10-14 – 2024-10-15 (×2): 20 mg via ORAL
  Filled 2024-10-14 (×2): qty 1

## 2024-10-14 MED ORDER — SODIUM CHLORIDE 0.9 % IV SOLN: INTRAVENOUS | Status: AC

## 2024-10-14 MED ORDER — PREDNISONE 20 MG PO TABS
20.0000 mg | ORAL_TABLET | Freq: Two times a day (BID) | ORAL | Status: DC
  Administered 2024-10-14 – 2024-10-15 (×3): 20 mg via ORAL
  Filled 2024-10-14 (×3): qty 1

## 2024-10-14 MED ORDER — LEVOFLOXACIN 500 MG PO TABS
250.0000 mg | ORAL_TABLET | Freq: Every day | ORAL | Status: DC
  Administered 2024-10-14 – 2024-10-15 (×2): 250 mg via ORAL
  Filled 2024-10-14 (×2): qty 1

## 2024-10-14 MED ORDER — BUDESON-GLYCOPYRROL-FORMOTEROL 160-9-4.8 MCG/ACT IN AERO
2.0000 | INHALATION_SPRAY | Freq: Two times a day (BID) | RESPIRATORY_TRACT | Status: DC
  Administered 2024-10-14: 2 via RESPIRATORY_TRACT
  Filled 2024-10-14: qty 5.9

## 2024-10-14 MED ORDER — ARFORMOTEROL TARTRATE 15 MCG/2ML IN NEBU
15.0000 ug | INHALATION_SOLUTION | Freq: Two times a day (BID) | RESPIRATORY_TRACT | Status: DC
  Administered 2024-10-14 – 2024-10-15 (×2): 15 ug via RESPIRATORY_TRACT
  Filled 2024-10-14 (×2): qty 2

## 2024-10-14 MED ORDER — FOSFOMYCIN TROMETHAMINE 3 G PO PACK
3.0000 g | PACK | Freq: Once | ORAL | Status: AC
  Administered 2024-10-14: 3 g via ORAL
  Filled 2024-10-14: qty 3

## 2024-10-14 NOTE — Progress Notes (Signed)
 PHARMACY NOTE:  ANTIMICROBIAL RENAL DOSAGE ADJUSTMENT  Current antimicrobial regimen includes a mismatch between antimicrobial dosage and estimated renal function.  As per policy approved by the Pharmacy & Therapeutics and Medical Executive Committees, the antimicrobial dosage will be adjusted accordingly.  Current antimicrobial dosage:  Levaquin  500mg  po daily  Indication: recent pneumonia, presumptive UTI  Renal Function:  Estimated Creatinine Clearance: 26.1 mL/min (A) (by C-G formula based on SCr of 1.04 mg/dL (H)).     Antimicrobial dosage has been changed to:  Levaquin  250mg  po daily   Thank you for allowing pharmacy to be a part of this patient's care.  Arvin Gauss, PharmD 10/14/2024 6:07 AM

## 2024-10-14 NOTE — Progress Notes (Signed)
 " PROGRESS NOTE    Amanda [name]  FMW:[id number] DOB: 1941/09/14 DOA: 10/13/2024  PCP: Amanda Dorothey HERO, NP   Chief Complaint  Patient presents with   Hematuria    Brief admission narrative:  As per H&P written by Dr. Shona on 10/13/2024 Amanda [name] is a 84 y.o. female with medical history significant for recurrent spontaneous pneumothorax, previously seen by cardiothoracic surgery, former smoker, COPD on 2 L nasal cannula at baseline, recurrent complicated UTIs, history of bladder cancer status post urostomy, recently admitted at [facility] on 10/08/2024 for COPD exacerbation during that admission she was found to have pneumonia, DVTs, and pulmonary embolism, was discharged on Levaquin  and Eliquis  on 10/10/2024, presents to the ER due to generalized weakness and poor oral intake.  She has not been able to get out of bed since she returned home from [facility].  Denies any subjective fevers or chills.  Daughter noted some blood in her urine from her urostomy bag and was concerned.  She brought her to the ER for further evaluation.   In the ER, frail-appearing.  Tachycardic.  Chest x-ray was nonacute.  UA was positive for pyuria.  The patient received 1 breathing treatment Atrovent  nebulizers.  TRH, hospitalist service, was asked to admit due to generalized weakness.  Assessment & Plan: 1-generalized weakness - In the setting of deconditioning and mild dehydration - Physical therapy have seen patient and has recommended home health PT at discharge - Patient's MCV elevated and high concerns for B12 deficiency given already B12 levels in the 200 range on previous checks. 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  2-presumptive UTI - Patient actively receiving antibiotics for bronchiectasis that will also cover any urinary infection - Given  the fact that she has been started on steroids most likely her complaining of fungal dysfunction as expressed in the presence of dysuria - Will treat with Diflucan .  3-mild acute kidney injury - Improving/resolved after receiving fluid resuscitation - Maintain adequate hydration - Minimize nephrotoxic agent - Follow renal function trend.  4-severe protein calorie malnutrition Body mass index is 14.82 kg/m.  - Continue feeding supplements and daily vitamins - Appreciate dietitian recommendation. - Patient advised to maintain adequate hydration.  5-patient with recent diagnosis of pneumonia/bronchiectasis and COPD exacerbation - Good saturation on chronic supplementation - No active wheezing appreciated - Will continue steroids tapering as instructed and also complete oral antibiotics.  6-chronic respiratory failure with hypoxia - Continue oxygen  supplementation.  7-GERD - Continue PPI.  8-recent history of DVT/PE - Continue treatment with Eliquis .  DVT prophylaxis: On Eliquis . Code Status: Full code. Family Communication: Daughter at bedside. Disposition:   Status is: Inpatient Remains inpatient appropriate because: Continue IV therapy.   Consultants:  None  Procedures:  See below for x-ray reports.  Antimicrobials:  Levaquin ; Diflucan  as part of antifungal.   Subjective: No nausea, no vomiting, no chest pain.  Reports feeling weak/deconditioned.  Good saturation on chronic supplementation.  Patient is afebrile.  Reports some dysuria.  Objective: Vitals:   10/14/24 0420 10/14/24 0810 10/14/24 0818 10/14/24 1437  BP: 112/76     Pulse: (!) 103     Resp: 18     Temp: 97.8 F (36.6 C)     TempSrc: Oral     SpO2: 91% 100% 100% 100%  Weight:      Height:  Intake/Output Summary (Last 24 hours) at 10/14/2024 1449 Last data filed at 10/14/2024 0905 Gross per 24 hour  Intake 521.02 ml  Output --  Net 521.02 ml   Filed Weights   10/13/24 1617 10/14/24  0007  Weight: 44.5 kg 40.4 kg    Examination:  General exam: Appears calm and denies significant shortness of breath.  Good saturation on chronic 3 L appreciated. Respiratory system: Positive scattered rhonchi; no using accessory muscle. Cardiovascular system: S1 & S2 heard, RRR. No JVD, murmurs, rubs, gallops or clicks. No pedal edema. Gastrointestinal system: Abdomen is nondistended, soft and nontender. No organomegaly or masses felt. Normal bowel sounds heard. Central nervous system: Able to move 4 limbs spontaneously.  Generally weak.  No focal neurological deficits. Extremities: No cyanosis, clubbing or edema on exam. Skin: No petechiae. Psychiatry: Judgement and insight appear normal.  Flat affect.  Data Reviewed: I have personally reviewed following labs and imaging studies  CBC: Recent Labs  Lab 10/13/24 1716  WBC 11.2*  HGB 11.2*  HCT 35.9*  MCV 101.7*  PLT 211    Basic Metabolic Panel: Recent Labs  Lab 10/13/24 1716  NA 142  K 5.2*  CL 105  CO2 24  GLUCOSE 116*  BUN 24*  CREATININE 1.04*  CALCIUM 9.0    GFR: Estimated Creatinine Clearance: 26.1 mL/min (A) (by C-G formula based on SCr of 1.04 mg/dL (H)).  Radiology Studies: DG Chest Port 1 View Result Date: 10/13/2024 CLINICAL DATA:  Shortness of breath. EXAM: PORTABLE CHEST 1 VIEW COMPARISON:  Chest radiograph dated 09/01/2024. FINDINGS: Background of emphysema. No focal consolidation, pleural effusion or pneumothorax. The cardiac silhouette is within limits. Osteopenia with degenerative changes of the spine and scoliosis. No acute osseous pathology. IMPRESSION: 1. No active disease. 2. Emphysema. Electronically Signed   By: Vanetta Chou M.D.   On: 10/13/2024 18:19   Scheduled Meds:  apixaban   10 mg Oral BID   Followed by   NOREEN ON 10/17/2024] apixaban   5 mg Oral BID   arformoterol   15 mcg Nebulization BID   budesonide  (PULMICORT ) nebulizer solution  0.5 mg Nebulization BID   cholecalciferol    5,000 Units Oral Daily   famotidine   20 mg Oral Daily   feeding supplement  1 Container Oral TID BM   guaiFENesin   600 mg Oral Daily   ipratropium-albuterol   3 mL Nebulization Q6H   levofloxacin   250 mg Oral Daily   multivitamin with minerals  1 tablet Oral Daily   predniSONE   20 mg Oral BID WC   Continuous Infusions:  sodium chloride  75 mL/hr at 10/14/24 0515     LOS: 0 days    Time spent: 50 minutes    Eric Nunnery, MD [facility]   To contact the attending provider between 7A-7P or the covering provider during after hours 7P-7A, please log into the web site [email or web address] and access using universal [place] password for that web site. If you do not have the password, please call the hospital operator.  10/14/2024, 2:49 PM    "

## 2024-10-14 NOTE — Plan of Care (Signed)

## 2024-10-14 NOTE — Plan of Care (Signed)
" °  Problem: Acute Rehab PT Goals(only PT should resolve) Goal: Pt Will Go Supine/Side To Sit Outcome: Progressing Flowsheets (Taken 10/14/2024 1359) Pt will go Supine/Side to Sit: with contact guard assist Goal: Patient Will Transfer Sit To/From Stand Outcome: Progressing Flowsheets (Taken 10/14/2024 1359) Patient will transfer sit to/from stand: with contact guard assist Goal: Pt Will Transfer Bed To Chair/Chair To Bed Outcome: Progressing Flowsheets (Taken 10/14/2024 1359) Pt will Transfer Bed to Chair/Chair to Bed: with contact guard assist Goal: Pt Will Ambulate Outcome: Progressing Flowsheets (Taken 10/14/2024 1359) Pt will Ambulate:  with rolling walker  with contact guard assist  15 feet   2:00 PM, 10/14/24 Bryant Saye Powell-Butler, PT, DPT [place] with [facility]  "

## 2024-10-14 NOTE — Evaluation (Signed)
 Physical Therapy Evaluation Patient Details Name: Amanda [name] MRN: [id number] DOB: Oct 21, 1940 Today's Date: 10/14/2024  History of Present Illness  Amanda [name] is a 84 y.o. female with medical history significant for recurrent spontaneous pneumothorax, previously seen by cardiothoracic surgery, former smoker, COPD on 2 L nasal cannula at baseline, recurrent complicated UTIs, history of bladder cancer status post urostomy, recently admitted at [facility] on 10/08/2024 for COPD exacerbation during that admission she was found to have pneumonia, DVTs, and pulmonary embolism, was discharged on Levaquin  and Eliquis  on 10/10/2024, presents to the ER due to generalized weakness and poor oral intake.  She has not been able to get out of bed since she returned home from [facility].  Denies any subjective fevers or chills.  Daughter noted some blood in her urine from her urostomy bag and was concerned.  She brought her to the ER for further evaluation.   Clinical Impression  Patient was agreeable to PT evaluation. Patients daughter was present during session and contributed to history taking. They report at baseline, pt was able to ambulate in home with rollator but has had inc difficulty recently due to pneumonia, DVT/PE  complications. Reports at baseline she receives assist for ADLs as well. This date, patient requires min assist for all bed mobility, transfers and very short ambulation at bedside with rollator. Pt is on her baseline of 3 Lpm via [place] and exhibits some mild SOB throughout. Pt daughter is very helpful and hands on throughout. Pt is limited due to increased fatigue as well as back and neck pain. Pt returns to bed at end of session, with bed alarm set. Patient will benefit from continued skilled physical therapy acutely and in recommended venue in order to address current deficits to improve/maintain current level of function.        If plan is discharge home, recommend the following: A little help with  walking and/or transfers;A little help with bathing/dressing/bathroom;Assistance with cooking/housework;Supervision due to cognitive status;Assist for transportation;Help with stairs or ramp for entrance   Can travel by private vehicle        Equipment Recommendations None recommended by PT  Recommendations for Other Services       Functional Status Assessment Patient has had a recent decline in their functional status and demonstrates the ability to make significant improvements in function in a reasonable and predictable amount of time.     Precautions / Restrictions Precautions Precautions: Fall Recall of Precautions/Restrictions: Impaired Restrictions Weight Bearing Restrictions Per Provider Order: No      Mobility  Bed Mobility Overal bed mobility: Needs Assistance Bed Mobility: Supine to Sit, Sit to Supine     Supine to sit: Min assist Sit to supine: Min assist   General bed mobility comments: min assist needed due to general weakness, HOB slightly elevated as pt sleeps propped for breathing, assisted with trunk control via pull to sit, slow labored movement overall    Transfers Overall transfer level: Needs assistance Equipment used: Rollator (4 wheels) Transfers: Sit to/from Stand Sit to Stand: Min assist           General transfer comment: min assist secondary to general weakness, used pt personal rollator, 2 STS in session from bed, slow labored movement overall, pt able to tolerate standing for about 10 seconds before needing to sit, limited by fatigue    Ambulation/Gait Ambulation/Gait assistance: Contact guard assist Gait Distance (Feet): 3 Feet Assistive device: Rollator (4 wheels) Gait Pattern/deviations: Step-through pattern, Decreased step length -  right, Decreased step length - left, Decreased stride length, Trunk flexed Gait velocity: Dec     General Gait Details: Pt limited to a few side steps at bedside with rollator for support, limited by  fatigue, pt demo slow labored movement, pt on baseline 3 Lpm in session, general unsteadiness but no overt LOB  Stairs            Wheelchair Mobility     Tilt Bed    Modified Rankin (Stroke Patients Only)       Balance Overall balance assessment: Needs assistance Sitting-balance support: No upper extremity supported, Feet supported Sitting balance-Leahy Scale: Fair Sitting balance - Comments: fair/good seated EOB   Standing balance support: During functional activity, Reliant on assistive device for balance, Bilateral upper extremity supported Standing balance-Leahy Scale: Fair Standing balance comment: W/ rollator                             Pertinent Vitals/Pain Pain Assessment Pain Assessment: 0-10 Pain Score: 7  Pain Location: upper back Pain Descriptors / Indicators: Aching, Sharp Pain Intervention(s): Limited activity within patient's tolerance, Monitored during session, Repositioned    Home Living Family/patient expects to be discharged to:: Private residence Living Arrangements: Children Available Help at Discharge: Family;Available 24 hours/day Type of Home: House Home Access: Level entry       Home Layout: One level Home Equipment: BSC/3in1;Rollator (4 wheels);Shower seat Additional Comments: pt daughter present during session. confirms/updates previously entered home info. daughter reports pt lives with her and she is available 24/7    Prior Function Prior Level of Function : Needs assist       Physical Assist : Mobility (physical);ADLs (physical) Mobility (physical): Gait ADLs (physical): Bathing;Dressing;IADLs Mobility Comments: household ambulator with rollator, short distances ADLs Comments: assisted by daughter     Extremity/Trunk Assessment   Upper Extremity Assessment Upper Extremity Assessment: Generalized weakness (shoulder flexion AROM  limited due to inc pain, ~140 deg bilat, daughter reports as baseline, MMT 4-/5)     Lower Extremity Assessment Lower Extremity Assessment: Generalized weakness (ankle DF MMT 4/5 bilaterally, hip flexion MMT 4-/5 bilateraly)    Cervical / Trunk Assessment Cervical / Trunk Assessment: Kyphotic  Communication   Communication Communication: No apparent difficulties    Cognition Arousal: Alert Behavior During Therapy: WFL for tasks assessed/performed                             Following commands: Intact       Cueing Cueing Techniques: Verbal cues, Visual cues     General Comments      Exercises     Assessment/Plan    PT Assessment All further PT needs can be met in the next venue of care;Patient needs continued PT services  PT Problem List Decreased strength;Decreased range of motion;Decreased activity tolerance;Decreased balance;Decreased mobility;Pain       PT Treatment Interventions DME instruction;Balance training;Gait training;Functional mobility training;Therapeutic activities;Therapeutic exercise;Patient/family education    PT Goals (Current goals can be found in the Care Plan section)  Acute Rehab PT Goals Patient Stated Goal: Return home PT Goal Formulation: With patient/family Time For Goal Achievement: 10/21/24 Potential to Achieve Goals: Good    Frequency Min 3X/week     Co-evaluation               AM-PAC PT 6 Clicks Mobility  Outcome Measure Help needed turning from your back to  your side while in a flat bed without using bedrails?: A Little Help needed moving from lying on your back to sitting on the side of a flat bed without using bedrails?: A Little Help needed moving to and from a bed to a chair (including a wheelchair)?: A Little Help needed standing up from a chair using your arms (e.g., wheelchair or bedside chair)?: A Little Help needed to walk in hospital room?: A Lot Help needed climbing 3-5 steps with a railing? : A Lot 6 Click Score: 16    End of Session Equipment Utilized During Treatment:  Gait belt Activity Tolerance: Patient tolerated treatment well;Patient limited by fatigue Patient left: in bed;with call bell/phone within reach;with bed alarm set;with family/visitor present   PT Visit Diagnosis: Muscle weakness (generalized) (M62.81);Other abnormalities of gait and mobility (R26.89);Unsteadiness on feet (R26.81);Pain Pain - Right/Left:  (upper back and neck)    Time: 8954-8897 PT Time Calculation (min) (ACUTE ONLY): 17 min   Charges:   PT Evaluation $PT Eval Low Complexity: 1 Low   PT General Charges $$ ACUTE PT VISIT: 1 Visit         1:58 PM, 10/14/24 Janitza Revuelta Powell-Butler, PT, DPT [place] with [facility]

## 2024-10-14 NOTE — TOC Initial Note (Signed)
 Transition of Care [facility]) - Initial/Assessment Note    Patient Details  Name: Amanda [name] MRN: [id number] Date of Birth: 05/16/1941  Transition of Care [facility]) CM/SW Contact:    Lucie Lunger, LCSWA Phone Number: 10/14/2024, 2:34 PM  Clinical Narrative:                 Pt is high risk for readmission. CSW spoke with pts daughter to complete assessment. Pt lives with her daughter. Pts daughter is able to assist in completing ADLs if needed. Pt has a rollator to use in the home when needed. Pts daughter and son provide transportation when needed. Pt has had HH many years ago. Pts daughter states they are agreeable to [facility] referral being sent out locally for review. CSW sent out referral at this time. TOC to follow.   Expected Discharge Plan: Home w Home Health Services Barriers to Discharge: Continued Medical Work up   Patient Goals and CMS Choice Patient states their goals for this hospitalization and ongoing recovery are:: return home CMS Medicare.gov Compare Post Acute Care list provided to:: Patient Represenative (must comment) Choice offered to / list presented to : Adult Children      Expected Discharge Plan and Services In-house Referral: Clinical Social Work Discharge Planning Services: CM Consult Post Acute Care Choice: Home Health Living arrangements for the past 2 months: Single Family Home                                      Prior Living Arrangements/Services Living arrangements for the past 2 months: Single Family Home Lives with:: Adult Children Patient language and need for interpreter reviewed:: Yes Do you feel safe going back to the place where you live?: Yes      Need for Family Participation in Patient Care: Yes (Comment) Care giver support system in place?: Yes (comment) Current home services: DME Criminal Activity/Legal Involvement Pertinent to Current Situation/Hospitalization: No - Comment as needed  Activities of Daily Living   ADL  Screening (condition at time of admission) Independently performs ADLs?: Yes (appropriate for developmental age) Is the patient deaf or have difficulty hearing?: Yes Does the patient have difficulty seeing, even when wearing glasses/contacts?: No Does the patient have difficulty concentrating, remembering, or making decisions?: No  Permission Sought/Granted                  Emotional Assessment Appearance:: Appears stated age       Alcohol / Substance Use: Not Applicable Psych Involvement: No (comment)  Admission diagnosis:  Generalized weakness [R53.1] Chronic obstructive pulmonary disease, unspecified COPD type (HCC) [J44.9] Weakness generalized [R53.1] Patient Active Problem List   Diagnosis Date Noted   Weakness generalized 10/14/2024   Generalized weakness 10/13/2024   S/P Bronchoscopy with placement of IBV  12/02/2019   Pneumothorax on right 12/01/2019   Hypokalemia 09/30/2019   Recurrent spontaneous pneumothorax 09/29/2019   Emphysema/COPD 07/06/2018   Protein-calorie malnutrition, severe 06/25/2018   Dementia (HCC) 06/22/2018   SOB (shortness of breath) 06/21/2018   Spontaneous pneumothorax 06/21/2018   PCP:  Renato Dorothey HERO, NP Pharmacy:   [facility] [address] [place] - [address] [place] [address] Phone: [phone] Fax: [phone]     Social Drivers of Health (SDOH) Social History: SDOH Screenings   Food Insecurity: No Food Insecurity (10/14/2024)  Housing: Low Risk (10/14/2024)  Transportation Needs:  No Transportation Needs (10/14/2024)  Utilities: Not At Risk (10/14/2024)  Financial Resource Strain: Low Risk (10/09/2024)   Received from [facility]  Physical Activity: Insufficiently Active (10/09/2024)   Received from [facility]  Social Connections: Unknown (10/14/2024)  Recent Concern: Social Connections - Moderately Isolated (08/19/2024)   Received from [facility]  Stress: No Stress Concern Present  (10/09/2024)   Received from [facility]  Recent Concern: Stress - Stress Concern Present (08/19/2024)   Received from [facility]  Tobacco Use: Medium Risk (10/14/2024)  Health Literacy: Low Risk (10/09/2024)   Received from [facility]  Recent Concern: Health Literacy - High Risk (08/19/2024)   Received from [facility]   SDOH Interventions:     Readmission Risk Interventions    10/14/2024    2:33 PM  Readmission Risk Prevention Plan  Transportation Screening Complete  HRI or Home Care Consult Complete  Social Work Consult for Recovery Care Planning/Counseling Complete  Palliative Care Screening Not Applicable  Medication Review Oceanographer) Complete

## 2024-10-14 NOTE — Progress Notes (Addendum)
 Initial Nutrition Assessment  DOCUMENTATION CODES:  Underweight  INTERVENTION:  Boost Breeze po TID, each supplement provides 250 kcal and 9 grams of protein MVI with minerals daily Lactose free milk with meals per patient preference  NUTRITION DIAGNOSIS:  Increased nutrient needs related to chronic illness as evidenced by estimated needs.  GOAL:  Patient will meet greater than or equal to 90% of their needs  MONITOR:  PO intake, Supplement acceptance  REASON FOR ASSESSMENT:  Consult Assessment of nutrition requirement/status  ASSESSMENT:  84 yo female admitted with multifactorial generalized weakness, AKI, UTI. PMH includes COPD on 2 L nasal cannula at baseline, recurrent spontaneous pneumothorax, bladder cancer, former smoker.  Spoke with patient over the phone. She ate a big breakfast today, more than she usually eats. She tries to eat small, frequent meals. She is lactose intolerant. She likes chocolate Boost supplements, but chocolate Ensure supplements cause GI distress. She agreed to try Boost Breeze while in the hospital.   Suspect patient is malnourished, Unable to obtain enough information at this time for identification of malnutrition.   Usual weight: 44.5 kg (07/30/24) Current weight: 40.4 kg (10/14/24)  9% weight loss within the past 3 months is severe  Average Meal Intake: 1/16: 75% intake of breakfast this morning  Nutritionally Relevant Medications: Scheduled Meds:  apixaban   10 mg Oral BID   Followed by   NOREEN ON 10/17/2024] apixaban   5 mg Oral BID   arformoterol   15 mcg Nebulization BID   budesonide  (PULMICORT ) nebulizer solution  0.5 mg Nebulization BID   cholecalciferol   5,000 Units Oral Daily   famotidine   20 mg Oral Daily   guaiFENesin   600 mg Oral Daily   ipratropium-albuterol   3 mL Nebulization Q6H   levofloxacin   250 mg Oral Daily   predniSONE   20 mg Oral BID WC   Continuous Infusions:  sodium chloride  75 mL/hr at 10/14/24 0515   PRN  Meds:.acetaminophen , ALPRAZolam , guaiFENesin -dextromethorphan , ipratropium-albuterol , melatonin, polyethylene glycol, prochlorperazine   Labs Reviewed: K 5.2  NUTRITION - FOCUSED PHYSICAL EXAM: Unable to complete at this time, RD working remotely  Diet Order:   Diet Order             Diet regular Room service appropriate? Yes; Fluid consistency: Thin  Diet effective now                   EDUCATION NEEDS:  Education needs have been addressed  Skin:  Skin Assessment: Reviewed RN Assessment  Last BM:  1/15  Height:  Ht Readings from Last 1 Encounters:  10/14/24 5' 5 (1.651 m)   Weight:  Wt Readings from Last 1 Encounters:  10/14/24 40.4 kg   Ideal Body Weight:  56.8 kg  BMI:  Body mass index is 14.82 kg/m.  Estimated Nutritional Needs:  Kcal:  1300-1500 Protein:  60-75 gm Fluid:  1.5 L   Suzen HUNT RD, LDN, CNSC Contact via secure chat. If unavailable, use group chat RD Inpatient.

## 2024-10-15 DIAGNOSIS — K219 Gastro-esophageal reflux disease without esophagitis: Secondary | ICD-10-CM

## 2024-10-15 DIAGNOSIS — J479 Bronchiectasis, uncomplicated: Secondary | ICD-10-CM

## 2024-10-15 DIAGNOSIS — N760 Acute vaginitis: Secondary | ICD-10-CM

## 2024-10-15 MED ORDER — FLUCONAZOLE 100 MG PO TABS: 100.0000 mg | ORAL_TABLET | Freq: Every day | ORAL | 0 refills | Status: AC

## 2024-10-15 MED ORDER — PREDNISONE 20 MG PO TABS: ORAL_TABLET | ORAL | 0 refills | Status: AC

## 2024-10-15 MED ORDER — DM-GUAIFENESIN ER 30-600 MG PO TB12: 1.0000 | ORAL_TABLET | Freq: Two times a day (BID) | ORAL | 0 refills | Status: AC | PRN

## 2024-10-15 MED ORDER — VITAMIN B-12 1000 MCG PO TABS: 1000.0000 ug | ORAL_TABLET | Freq: Every day | ORAL | 3 refills | Status: AC

## 2024-10-15 MED ORDER — FAMOTIDINE 20 MG PO TABS: 20.0000 mg | ORAL_TABLET | Freq: Two times a day (BID) | ORAL | 2 refills | Status: AC

## 2024-10-15 MED ORDER — LEVOFLOXACIN 250 MG PO TABS: 250.0000 mg | ORAL_TABLET | Freq: Every day | ORAL | 0 refills | Status: AC

## 2024-10-15 NOTE — Discharge Summary (Signed)
 " Physician Discharge Summary   Patient: Amanda [name] MRN: [id number] DOB: 1941/02/24  Admit date:     10/13/2024  Discharge date: 10/15/24  Discharge Physician: Eric Nunnery   PCP: Renato Dorothey HERO, NP   Recommendations at discharge:  Repeat basic metabolic panel to follow ultralights renal function - Repeated/follow patient B12 level and determine definitive route of repletion if needed. Recommending for goals of care discussion and advance care planning.   Discharge Diagnoses: Principal Problem:   Generalized weakness Active Problems:   Chronic obstructive pulmonary disease (HCC)   Weakness generalized   Acute vaginitis   Gastroesophageal reflux disease   Bronchiectasis without complication (HCC)  Brief admission narrative:  As per H&P written by Dr. Shona on 10/13/2024 Amanda [name] is a 84 y.o. female with medical history significant for recurrent spontaneous pneumothorax, previously seen by cardiothoracic surgery, former smoker, COPD on 2 L nasal cannula at baseline, recurrent complicated UTIs, history of bladder cancer status post urostomy, recently admitted at [facility] on 10/08/2024 for COPD exacerbation during that admission she was found to have pneumonia, DVTs, and pulmonary embolism, was discharged on Levaquin  and Eliquis  on 10/10/2024, presents to the ER due to generalized weakness and poor oral intake.  She has not been able to get out of bed since she [facility].  Denies any subjective fevers or chills.  Daughter noted some blood in her urine from her urostomy bag and was concerned.  She brought her to the ER for further evaluation.   In the ER, frail-appearing.  Tachycardic.  Chest x-ray was nonacute.  UA was positive for pyuria.  The patient received 1 breathing treatment Atrovent  nebulizers.  TRH, hospitalist service, was asked to admit due to generalized weakness.   Assessment & Plan: 1-generalized weakness - In the setting of deconditioning and mild  dehydration - Physical therapy have seen patient and has recommended home health PT at discharge - Patient's MCV elevated and high concerns for B12 deficiency given already B12 levels in the 200 range on previous checks. 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 - Continue supportive care maintain adequate hydration. -Home health services has been arranged at discharge.   2-presumptive UTI - Patient actively receiving antibiotics for bronchiectasis that will also cover any urinary infection - Given the fact that she has been started on steroids most likely her complaining of fungal dysfunction as expressed in the presence of dysuria - Will treat with Diflucan .   3-mild acute kidney injury - Improved/resolved after receiving fluid resuscitation - Maintain adequate hydration - Minimize nephrotoxic agent - Follow renal function trend.   4-severe protein calorie malnutrition Body mass index is 14.82 kg/m.  - Continue feeding supplements and daily vitamins - Appreciate dietitian recommendation. - Patient advised to maintain adequate hydration.   5-patient with recent diagnosis of pneumonia/bronchiectasis and COPD exacerbation - Good saturation on chronic supplementation - No active wheezing appreciated - Will continue steroids tapering as instructed and also complete oral antibiotics as prescribed.   6-chronic respiratory failure with hypoxia - Continue oxygen  supplementation. -Good saturation appreciated on 2.5-3 L supplementation.   7-GERD - Continue famotidine  twice a day.   8-recent history of DVT/PE - Continue treatment with Eliquis .  Consultants: None Procedures performed: See below for x-ray reports. Disposition: Home with home health services. Diet recommendation:   DISCHARGE MEDICATION: Allergies as of 10/15/2024  Reactions   Misc. Sulfonamide Containing Compounds Shortness Of Breath   Other reaction(s): Other (See Comments)    Dizziness   Sulfa Antibiotics Shortness Of Breath   Ciprofloxacin Other (See Comments)   GI pain, headache. Pt states she had while she was in hospital (IV) and was ok GI pain, headache. Pt states she had while she was in hospital (IV) and was ok   GI pain, headache. Pt states she had while she was in hospital (IV) and was ok   Lactose Other (See Comments)   Intolerant per patient   Levofloxacin     Hallucinations   Sulfamethoxazole Other (See Comments)   Dizziness   Aspirin Nausea And Vomiting   Cephalexin Rash   Clindamycin Rash   Erythromycin Base Rash   Hydromorphone Rash, Other (See Comments)   Rash on back, redness   Penicillins Rash        Medication List     STOP taking these medications    fosfomycin  3 g Pack Commonly known as: MONUROL    guaiFENesin  600 MG 12 hr tablet Commonly known as: MUCINEX        TAKE these medications    acetaminophen  500 MG tablet Commonly known as: TYLENOL  Take 500-1,000 mg by mouth daily as needed for mild pain or moderate pain.   albuterol  108 (90 Base) MCG/ACT inhaler Commonly known as: VENTOLIN  HFA Inhale 1-2 puffs into the lungs every 6 (six) hours as needed for wheezing or shortness of breath.   alendronate 70 MG tablet Commonly known as: FOSAMAX Take 70 mg by mouth once a week. Does not take regularly   ALPRAZolam  0.5 MG tablet Commonly known as: XANAX  Take 0.5 mg by mouth daily.   apixaban  5 MG Tabs tablet Commonly known as: ELIQUIS  Take 5 mg by mouth 2 (two) times daily.   Breztri  Aerosphere 160-9-4.8 MCG/ACT Aero inhaler Generic drug: budesonide -glycopyrrolate -formoterol  Inhale 2 puffs into the lungs 2 (two) times daily.   cyanocobalamin  1000 MCG tablet Commonly known as: VITAMIN B12 Take 1 tablet (1,000 mcg total) by mouth daily.   dextromethorphan -guaiFENesin  30-600 MG 12hr  tablet Commonly known as: MUCINEX  DM Take 1 tablet by mouth 2 (two) times daily as needed for cough.   docusate sodium  100 MG capsule Commonly known as: COLACE Take 1 capsule (100 mg total) by mouth 2 (two) times daily. What changed: when to take this   famotidine  20 MG tablet Commonly known as: PEPCID  Take 1 tablet (20 mg total) by mouth 2 (two) times daily. What changed: when to take this   fluconazole  100 MG tablet Commonly known as: DIFLUCAN  Take 1 tablet (100 mg total) by mouth daily for 2 days. Start taking on: October 16, 2024   HYDROcodone -acetaminophen  5-325 MG tablet Commonly known as: NORCO/VICODIN Take 1 tablet by mouth 2 (two) times daily as needed.   ipratropium-albuterol  0.5-2.5 (3) MG/3ML Soln Commonly known as: DUONEB Take 3 mLs by nebulization every 6 (six) hours as needed for wheezing or shortness of breath.   levofloxacin  250 MG tablet Commonly known as: LEVAQUIN  Take 1 tablet (250 mg total) by mouth daily for 3 days. Start taking on: October 16, 2024 What changed:  medication strength how much to take   ondansetron  4 MG disintegrating tablet Commonly known as: ZOFRAN -ODT Take 4 mg by mouth every 8 (eight) hours as needed for nausea or vomiting.   predniSONE  20 MG tablet Commonly known as: DELTASONE  2 tabs po daily x 1 day; then 1 tablet by mouth daily x  2 days; then half tablet by mouth daily x 3 days and stop prednisone . What changed: additional instructions   Vitamin C  500 MG Caps Take 500 mg by mouth daily.   Vitamin D -3 125 MCG (5000 UT) Tabs Take 5,000 Units by mouth daily.        Follow-up Information     Renato Dorothey HERO, NP. Schedule an appointment as soon as possible for a visit in 10 day(s).   Specialty: Internal Medicine Contact information: [id number] [place] [address] [phone]                Discharge Exam: Fredricka Weights   10/13/24 1617 10/14/24 0007  Weight: 44.5 kg 40.4 kg    General exam:  Appears calm and denies significant shortness of breath.  Good saturation on chronic 3 L appreciated. Respiratory system: Positive scattered rhonchi; no using accessory muscle. Cardiovascular system: S1 & S2 heard, RRR. No JVD, murmurs, rubs, gallops or clicks. No pedal edema. Gastrointestinal system: Abdomen is nondistended, soft and nontender. No organomegaly or masses felt. Normal bowel sounds heard. Central nervous system: Able to move 4 limbs spontaneously.  Generally weak.  No focal neurological deficits. Extremities: No cyanosis, clubbing or edema on exam. Skin: No petechiae. Psychiatry: Judgement and insight appear normal.  Flat affect.  Condition at discharge: Stable and improved.  The results of significant diagnostics from this hospitalization (including imaging, microbiology, ancillary and laboratory) are listed below for reference.   Imaging Studies: DG Chest Port 1 View Result Date: 10/13/2024 CLINICAL DATA:  Shortness of breath. EXAM: PORTABLE CHEST 1 VIEW COMPARISON:  Chest radiograph dated 09/01/2024. FINDINGS: Background of emphysema. No focal consolidation, pleural effusion or pneumothorax. The cardiac silhouette is within limits. Osteopenia with degenerative changes of the spine and scoliosis. No acute osseous pathology. IMPRESSION: 1. No active disease. 2. Emphysema. Electronically Signed   By: Vanetta Chou M.D.   On: 10/13/2024 18:19   Labs: CBC: Recent Labs  Lab 10/13/24 1716  WBC 11.2*  HGB 11.2*  HCT 35.9*  MCV 101.7*  PLT 211   Basic Metabolic Panel: Recent Labs  Lab 10/13/24 1716  NA 142  K 5.2*  CL 105  CO2 24  GLUCOSE 116*  BUN 24*  CREATININE 1.04*  CALCIUM 9.0   Discharge time spent:  35 minutes.  Signed: Eric Nunnery, MD [facility] 10/15/2024 "

## 2024-10-17 NOTE — TOC Transition Note (Signed)
 Transition of Care [facility]) - Discharge Note   Patient Details  Name: Amanda [name] MRN: [id number] Date of Birth: [birth year]  Transition of Care [facility]) CM/SW Contact:  Ronnald MARLA Sil, RN Phone Number: 10/17/2024, 1:10 PM   Clinical Narrative:    Patient discharged on Saturday, 10/15/24, IPCM confirmed HHPT order was entered and [facility] referrals were sent out, but missed opportunity to meet with patient prior to leaving hospital to obtain [facility] agency choice.  CM contacted Dtr - [name], with patient in background listening on speaker phone.    Patient selected [facility], CM made selection in HUB, forwarded Notes, Summary & HH orders, contacted agency liaison - Darleene to notify of patient's DC on Sat.   Patient with no additional needs at this time..   Final next level of care: Home w Home Health Services Barriers to Discharge: No Barriers Identified  Patient Goals and CMS Choice Patient states their goals for this hospitalization and ongoing recovery are:: Return Home with HHPT CMS Medicare.gov Compare Post Acute Care list provided to:: Patient Represenative (must comment) Choice offered to / list presented to : Adult Children  Discharge Plan and Services Additional resources added to the After Visit Summary for   In-house Referral: Clinical Social Work Discharge Planning Services: CM Consult Post Acute Care Choice: Home Health          DME Arranged: N/A DME Agency: NA HH Arranged: PT HH Agency: [facility] Date [facility] Agency Contacted: 10/17/24 Time HH Agency Contacted: 1310 Representative spoke with at [facility] [address] Agency: Darleene via Text  Social Drivers of Health (SDOH) Interventions SDOH Screenings   Food Insecurity: No Food Insecurity (10/14/2024)  Housing: Low Risk (10/14/2024)  Transportation Needs: No Transportation Needs (10/14/2024)  Utilities: Not At Risk (10/14/2024)  Financial Resource Strain: Low Risk (10/09/2024)   Received from [facility]  Physical Activity:  Insufficiently Active (10/09/2024)   Received from [facility]  Social Connections: Unknown (10/14/2024)  Recent Concern: Social Connections - Moderately Isolated (08/19/2024)   Received from [facility]  Stress: No Stress Concern Present (10/09/2024)   Received from [facility]  Recent Concern: Stress - Stress Concern Present (08/19/2024)   Received from [facility]  Tobacco Use: Medium Risk (10/14/2024)  Health Literacy: Low Risk (10/09/2024)   Received from [facility]  Recent Concern: Health Literacy - High Risk (08/19/2024)   Received from [facility]   Readmission Risk Interventions    10/14/2024    2:33 PM  Readmission Risk Prevention Plan  Transportation Screening Complete  HRI or Home Care Consult Complete  Social Work Consult for Recovery Care Planning/Counseling Complete  Palliative Care Screening Not Applicable  Medication Review Ocean[employer]) Complete

## 2024-10-18 ENCOUNTER — Encounter (HOSPITAL_COMMUNITY): Payer: Self-pay

## 2024-10-19 NOTE — TOC CM/SW Note (Signed)
 Transition of Care (TOC) CM/SW Note    CSW update by Darleene with [facility] that HUB needed to be updated that they were selected agency for [facility]. CSW notes per chart review that D/C RNCM confirmed [facility] as [facility] agency. CSW updated HUB to reflect [facility] as HH choice.
